# Patient Record
Sex: Female | Born: 1950
Health system: Southern US, Community
[De-identification: ages and names within clinical notes are randomized; demographics above are authoritative.]

## PROBLEM LIST (undated history)

## (undated) DIAGNOSIS — I10 Essential (primary) hypertension: Secondary | ICD-10-CM

## (undated) DIAGNOSIS — S2239XA Fracture of one rib, unspecified side, initial encounter for closed fracture: Secondary | ICD-10-CM

## (undated) DIAGNOSIS — T7840XA Allergy, unspecified, initial encounter: Secondary | ICD-10-CM

## (undated) DIAGNOSIS — S2220XA Unspecified fracture of sternum, initial encounter for closed fracture: Secondary | ICD-10-CM

## (undated) DIAGNOSIS — E119 Type 2 diabetes mellitus without complications: Secondary | ICD-10-CM

## (undated) DIAGNOSIS — F419 Anxiety disorder, unspecified: Secondary | ICD-10-CM

## (undated) DIAGNOSIS — E785 Hyperlipidemia, unspecified: Secondary | ICD-10-CM

## (undated) DIAGNOSIS — Z8719 Personal history of other diseases of the digestive system: Secondary | ICD-10-CM

## (undated) HISTORY — DX: Allergy, unspecified, initial encounter: T78.40XA

## (undated) HISTORY — PX: COLONOSCOPY: SHX174

## (undated) HISTORY — DX: Essential (primary) hypertension: I10

## (undated) HISTORY — PX: POLYPECTOMY: SHX149

---
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
## 1978-07-27 HISTORY — PX: OVARIAN CYST SURGERY: SHX726

## 1990-07-27 HISTORY — PX: TUBAL LIGATION: SHX77

## 1997-10-19 ENCOUNTER — Ambulatory Visit (HOSPITAL_COMMUNITY): Admission: RE | Admit: 1997-10-19 | Discharge: 1997-10-19 | Payer: Self-pay | Admitting: Family Medicine

## 1999-12-11 ENCOUNTER — Encounter: Payer: Self-pay | Admitting: Family Medicine

## 1999-12-11 ENCOUNTER — Ambulatory Visit (HOSPITAL_COMMUNITY): Admission: RE | Admit: 1999-12-11 | Discharge: 1999-12-11 | Payer: Self-pay | Admitting: Family Medicine

## 2000-04-27 ENCOUNTER — Encounter: Admission: RE | Admit: 2000-04-27 | Discharge: 2000-04-27 | Payer: Self-pay | Admitting: Otolaryngology

## 2000-04-27 ENCOUNTER — Encounter: Payer: Self-pay | Admitting: Otolaryngology

## 2001-04-29 ENCOUNTER — Encounter: Payer: Self-pay | Admitting: Family Medicine

## 2001-04-29 ENCOUNTER — Ambulatory Visit (HOSPITAL_COMMUNITY): Admission: RE | Admit: 2001-04-29 | Discharge: 2001-04-29 | Payer: Self-pay | Admitting: Family Medicine

## 2003-06-07 ENCOUNTER — Ambulatory Visit (HOSPITAL_COMMUNITY): Admission: RE | Admit: 2003-06-07 | Discharge: 2003-06-07 | Payer: Self-pay | Admitting: Family Medicine

## 2004-02-08 ENCOUNTER — Encounter: Admission: RE | Admit: 2004-02-08 | Discharge: 2004-02-08 | Payer: Self-pay | Admitting: Family Medicine

## 2004-09-12 ENCOUNTER — Ambulatory Visit: Payer: Self-pay | Admitting: Family Medicine

## 2005-03-19 ENCOUNTER — Other Ambulatory Visit: Admission: RE | Admit: 2005-03-19 | Discharge: 2005-03-19 | Payer: Self-pay | Admitting: Family Medicine

## 2005-03-19 ENCOUNTER — Ambulatory Visit: Payer: Self-pay | Admitting: Family Medicine

## 2005-03-27 ENCOUNTER — Ambulatory Visit: Payer: Self-pay | Admitting: Family Medicine

## 2005-09-21 ENCOUNTER — Ambulatory Visit: Payer: Self-pay | Admitting: Family Medicine

## 2005-11-27 ENCOUNTER — Ambulatory Visit: Payer: Self-pay | Admitting: Family Medicine

## 2006-02-09 ENCOUNTER — Ambulatory Visit: Payer: Self-pay | Admitting: Family Medicine

## 2006-03-03 ENCOUNTER — Ambulatory Visit: Payer: Self-pay | Admitting: Family Medicine

## 2006-04-01 ENCOUNTER — Ambulatory Visit: Payer: Self-pay | Admitting: Family Medicine

## 2006-04-20 ENCOUNTER — Ambulatory Visit: Payer: Self-pay | Admitting: Family Medicine

## 2006-05-25 ENCOUNTER — Ambulatory Visit: Payer: Self-pay | Admitting: Family Medicine

## 2006-05-25 ENCOUNTER — Encounter: Admission: RE | Admit: 2006-05-25 | Discharge: 2006-05-25 | Payer: Self-pay | Admitting: Family Medicine

## 2006-07-13 ENCOUNTER — Encounter: Payer: Self-pay | Admitting: Family Medicine

## 2006-07-13 ENCOUNTER — Ambulatory Visit: Payer: Self-pay | Admitting: Family Medicine

## 2006-07-13 ENCOUNTER — Other Ambulatory Visit: Admission: RE | Admit: 2006-07-13 | Discharge: 2006-07-13 | Payer: Self-pay | Admitting: Family Medicine

## 2006-07-13 LAB — CONVERTED CEMR LAB
AST: 36 units/L (ref 0–37)
Basophils Relative: 0.5 % (ref 0.0–1.0)
CO2: 27 meq/L (ref 19–32)
Calcium: 9.8 mg/dL (ref 8.4–10.5)
Chloride: 101 meq/L (ref 96–112)
Eosinophil percent: 2.6 % (ref 0.0–5.0)
Glomerular Filtration Rate, Af Am: 84 mL/min/{1.73_m2}
Glucose, Bld: 107 mg/dL — ABNORMAL HIGH (ref 70–99)
HDL: 70.8 mg/dL (ref >39.0)
MCHC: 33.3 g/dL (ref 30.0–36.0)
Monocytes Relative: 9.2 % (ref 3.0–11.0)
Neutro Abs: 1.9 10*3/uL (ref 1.4–7.7)
Platelets: 304 10*3/uL (ref 150–400)
RDW: 13.2 % (ref 11.5–14.6)
Sodium: 136 meq/L (ref 135–145)
Total Bilirubin: 1.5 mg/dL — ABNORMAL HIGH (ref 0.3–1.2)
Total Protein: 7.2 g/dL (ref 6.0–8.3)
Triglyceride fasting, serum: 54 mg/dL (ref 0–149)
VLDL: 11 mg/dL (ref 0–40)
WBC: 4.5 10*3/uL (ref 4.5–10.5)

## 2006-07-30 ENCOUNTER — Ambulatory Visit: Payer: Self-pay | Admitting: Family Medicine

## 2006-08-04 ENCOUNTER — Encounter: Admission: RE | Admit: 2006-08-04 | Discharge: 2006-08-04 | Payer: Self-pay | Admitting: Family Medicine

## 2006-08-18 ENCOUNTER — Ambulatory Visit: Payer: Self-pay | Admitting: Endocrinology

## 2006-11-29 ENCOUNTER — Ambulatory Visit: Payer: Self-pay | Admitting: Family Medicine

## 2006-12-31 ENCOUNTER — Ambulatory Visit: Payer: Self-pay | Admitting: Family Medicine

## 2006-12-31 DIAGNOSIS — I10 Essential (primary) hypertension: Secondary | ICD-10-CM

## 2006-12-31 DIAGNOSIS — N83209 Unspecified ovarian cyst, unspecified side: Secondary | ICD-10-CM

## 2006-12-31 DIAGNOSIS — R599 Enlarged lymph nodes, unspecified: Secondary | ICD-10-CM | POA: Insufficient documentation

## 2006-12-31 DIAGNOSIS — M26609 Unspecified temporomandibular joint disorder, unspecified side: Secondary | ICD-10-CM | POA: Insufficient documentation

## 2006-12-31 LAB — CONVERTED CEMR LAB
Blood in Urine, dipstick: NEGATIVE
Glucose, Urine, Semiquant: NEGATIVE
Ketones, urine, test strip: NEGATIVE
Nitrite: NEGATIVE
Protein, U semiquant: NEGATIVE
Urobilinogen, UA: NEGATIVE
pH: 6

## 2007-01-03 ENCOUNTER — Encounter (INDEPENDENT_AMBULATORY_CARE_PROVIDER_SITE_OTHER): Payer: Self-pay | Admitting: *Deleted

## 2007-01-03 LAB — CONVERTED CEMR LAB
Basophils Absolute: 0 10*3/uL (ref 0.0–0.1)
HCT: 39.7 % (ref 36.0–46.0)
Neutro Abs: 1.7 10*3/uL (ref 1.4–7.7)
RBC: 4.39 M/uL (ref 3.87–5.11)
RDW: 13.5 % (ref 11.5–14.6)
WBC: 4.3 10*3/uL — ABNORMAL LOW (ref 4.5–10.5)

## 2007-01-19 ENCOUNTER — Telehealth (INDEPENDENT_AMBULATORY_CARE_PROVIDER_SITE_OTHER): Payer: Self-pay | Admitting: *Deleted

## 2007-03-23 ENCOUNTER — Ambulatory Visit: Payer: Self-pay | Admitting: Family Medicine

## 2007-03-23 DIAGNOSIS — R21 Rash and other nonspecific skin eruption: Secondary | ICD-10-CM

## 2007-03-24 ENCOUNTER — Encounter: Payer: Self-pay | Admitting: Family Medicine

## 2007-03-29 ENCOUNTER — Encounter (INDEPENDENT_AMBULATORY_CARE_PROVIDER_SITE_OTHER): Payer: Self-pay | Admitting: *Deleted

## 2007-04-30 ENCOUNTER — Encounter: Payer: Self-pay | Admitting: *Deleted

## 2007-04-30 DIAGNOSIS — E042 Nontoxic multinodular goiter: Secondary | ICD-10-CM | POA: Insufficient documentation

## 2007-07-18 ENCOUNTER — Ambulatory Visit: Payer: Self-pay | Admitting: Internal Medicine

## 2007-09-07 ENCOUNTER — Ambulatory Visit: Payer: Self-pay | Admitting: Family Medicine

## 2007-09-07 LAB — CONVERTED CEMR LAB
Bilirubin Urine: NEGATIVE
Glucose, Urine, Semiquant: NEGATIVE
Ketones, urine, test strip: NEGATIVE
WBC Urine, dipstick: NEGATIVE
pH: 5.5

## 2007-09-29 ENCOUNTER — Ambulatory Visit: Payer: Self-pay | Admitting: Family Medicine

## 2007-09-29 LAB — CONVERTED CEMR LAB
Bilirubin Urine: NEGATIVE
Ketones, urine, test strip: NEGATIVE
Nitrite: NEGATIVE
Specific Gravity, Urine: <1.005
Urobilinogen, UA: NEGATIVE
pH: 8

## 2007-10-03 ENCOUNTER — Encounter (INDEPENDENT_AMBULATORY_CARE_PROVIDER_SITE_OTHER): Payer: Self-pay | Admitting: *Deleted

## 2007-10-17 ENCOUNTER — Ambulatory Visit (HOSPITAL_COMMUNITY): Admission: RE | Admit: 2007-10-17 | Discharge: 2007-10-17 | Payer: Self-pay | Admitting: Family Medicine

## 2007-10-27 ENCOUNTER — Encounter: Admission: RE | Admit: 2007-10-27 | Discharge: 2007-10-27 | Payer: Self-pay | Admitting: Family Medicine

## 2007-12-13 ENCOUNTER — Ambulatory Visit: Payer: Self-pay | Admitting: Family Medicine

## 2008-02-13 ENCOUNTER — Other Ambulatory Visit: Admission: RE | Admit: 2008-02-13 | Discharge: 2008-02-13 | Payer: Self-pay | Admitting: Family Medicine

## 2008-02-13 ENCOUNTER — Ambulatory Visit: Payer: Self-pay | Admitting: Family Medicine

## 2008-02-13 ENCOUNTER — Encounter: Payer: Self-pay | Admitting: Family Medicine

## 2008-02-17 ENCOUNTER — Encounter (INDEPENDENT_AMBULATORY_CARE_PROVIDER_SITE_OTHER): Payer: Self-pay | Admitting: *Deleted

## 2008-02-23 ENCOUNTER — Encounter (INDEPENDENT_AMBULATORY_CARE_PROVIDER_SITE_OTHER): Payer: Self-pay | Admitting: *Deleted

## 2008-03-09 ENCOUNTER — Encounter (INDEPENDENT_AMBULATORY_CARE_PROVIDER_SITE_OTHER): Payer: Self-pay | Admitting: *Deleted

## 2008-04-07 ENCOUNTER — Ambulatory Visit: Payer: Self-pay | Admitting: Family Medicine

## 2008-04-07 DIAGNOSIS — J111 Influenza due to unidentified influenza virus with other respiratory manifestations: Secondary | ICD-10-CM

## 2008-04-07 LAB — CONVERTED CEMR LAB: Inflenza A Ag: NEGATIVE

## 2008-04-23 ENCOUNTER — Ambulatory Visit: Payer: Self-pay | Admitting: Family Medicine

## 2008-05-04 ENCOUNTER — Encounter (INDEPENDENT_AMBULATORY_CARE_PROVIDER_SITE_OTHER): Payer: Self-pay | Admitting: *Deleted

## 2008-05-04 ENCOUNTER — Telehealth (INDEPENDENT_AMBULATORY_CARE_PROVIDER_SITE_OTHER): Payer: Self-pay | Admitting: *Deleted

## 2008-05-04 ENCOUNTER — Ambulatory Visit: Payer: Self-pay | Admitting: Internal Medicine

## 2008-06-06 ENCOUNTER — Encounter: Admission: RE | Admit: 2008-06-06 | Discharge: 2008-06-06 | Payer: Self-pay | Admitting: Family Medicine

## 2008-06-07 ENCOUNTER — Encounter (INDEPENDENT_AMBULATORY_CARE_PROVIDER_SITE_OTHER): Payer: Self-pay | Admitting: *Deleted

## 2008-08-22 ENCOUNTER — Telehealth: Payer: Self-pay | Admitting: Family Medicine

## 2008-08-24 ENCOUNTER — Ambulatory Visit: Payer: Self-pay | Admitting: Family Medicine

## 2008-08-24 DIAGNOSIS — K3189 Other diseases of stomach and duodenum: Secondary | ICD-10-CM

## 2008-08-24 DIAGNOSIS — R002 Palpitations: Secondary | ICD-10-CM

## 2008-08-24 DIAGNOSIS — R1013 Epigastric pain: Secondary | ICD-10-CM

## 2008-08-25 ENCOUNTER — Emergency Department (HOSPITAL_BASED_OUTPATIENT_CLINIC_OR_DEPARTMENT_OTHER): Admission: EM | Admit: 2008-08-25 | Discharge: 2008-08-25 | Payer: Self-pay | Admitting: Emergency Medicine

## 2008-08-27 ENCOUNTER — Telehealth (INDEPENDENT_AMBULATORY_CARE_PROVIDER_SITE_OTHER): Payer: Self-pay | Admitting: *Deleted

## 2008-08-27 DIAGNOSIS — E8881 Metabolic syndrome: Secondary | ICD-10-CM | POA: Insufficient documentation

## 2008-08-28 ENCOUNTER — Telehealth: Payer: Self-pay | Admitting: Family Medicine

## 2008-08-29 ENCOUNTER — Ambulatory Visit: Payer: Self-pay | Admitting: Family Medicine

## 2008-08-29 LAB — CONVERTED CEMR LAB
Bilirubin Urine: NEGATIVE
Blood in Urine, dipstick: NEGATIVE
Glucose, Bld: 378 mg/dL
Nitrite: NEGATIVE
Urobilinogen, UA: 0.2
WBC Urine, dipstick: NEGATIVE

## 2008-09-05 ENCOUNTER — Ambulatory Visit: Payer: Self-pay

## 2008-09-05 ENCOUNTER — Ambulatory Visit: Payer: Self-pay | Admitting: Family Medicine

## 2008-09-19 ENCOUNTER — Encounter: Payer: Self-pay | Admitting: Family Medicine

## 2008-09-25 ENCOUNTER — Encounter: Payer: Self-pay | Admitting: Family Medicine

## 2008-10-03 ENCOUNTER — Telehealth (INDEPENDENT_AMBULATORY_CARE_PROVIDER_SITE_OTHER): Payer: Self-pay | Admitting: *Deleted

## 2008-10-05 ENCOUNTER — Telehealth (INDEPENDENT_AMBULATORY_CARE_PROVIDER_SITE_OTHER): Payer: Self-pay | Admitting: *Deleted

## 2008-10-25 ENCOUNTER — Telehealth (INDEPENDENT_AMBULATORY_CARE_PROVIDER_SITE_OTHER): Payer: Self-pay | Admitting: *Deleted

## 2008-11-08 ENCOUNTER — Ambulatory Visit: Payer: Self-pay | Admitting: Family Medicine

## 2008-11-16 ENCOUNTER — Encounter (INDEPENDENT_AMBULATORY_CARE_PROVIDER_SITE_OTHER): Payer: Self-pay | Admitting: *Deleted

## 2008-11-16 LAB — CONVERTED CEMR LAB
AST: 26 units/L (ref 0–37)
Albumin: 4.1 g/dL (ref 3.5–5.2)
Alkaline Phosphatase: 64 units/L (ref 39–117)
Calcium: 9.7 mg/dL (ref 8.4–10.5)
Cholesterol: 269 mg/dL — ABNORMAL HIGH (ref 0–200)
Creatinine, Ser: 0.9 mg/dL (ref 0.4–1.2)
Creatinine,U: 145.7 mg/dL
Direct LDL: 186.4 mg/dL
Glucose, Bld: 80 mg/dL (ref 70–99)
Potassium: 5.2 meq/L — ABNORMAL HIGH (ref 3.5–5.1)
Total Bilirubin: 1.3 mg/dL — ABNORMAL HIGH (ref 0.3–1.2)
VLDL: 8 mg/dL (ref 0.0–40.0)

## 2008-12-19 ENCOUNTER — Ambulatory Visit: Payer: Self-pay | Admitting: Family Medicine

## 2008-12-19 DIAGNOSIS — B029 Zoster without complications: Secondary | ICD-10-CM | POA: Insufficient documentation

## 2009-03-21 ENCOUNTER — Ambulatory Visit: Payer: Self-pay | Admitting: Family Medicine

## 2009-03-21 DIAGNOSIS — J019 Acute sinusitis, unspecified: Secondary | ICD-10-CM

## 2009-04-01 LAB — CONVERTED CEMR LAB
ALT: 14 units/L (ref 0–35)
Albumin: 4.2 g/dL (ref 3.5–5.2)
Calcium: 9 mg/dL (ref 8.4–10.5)
Chloride: 102 meq/L (ref 96–112)
Creatinine, Ser: 0.8 mg/dL (ref 0.4–1.2)
Creatinine,U: 57.6 mg/dL
Direct LDL: 167.3 mg/dL
GFR calc non Af Amer: 95.18 mL/min (ref >60)
Hgb A1c MFr Bld: 5.7 % (ref 4.6–6.5)
Microalb, Ur: 0.2 mg/dL (ref 0.0–1.9)
VLDL: 8.4 mg/dL (ref 0.0–40.0)

## 2009-04-04 ENCOUNTER — Telehealth: Payer: Self-pay | Admitting: Family Medicine

## 2009-05-22 ENCOUNTER — Encounter: Admission: RE | Admit: 2009-05-22 | Discharge: 2009-05-22 | Payer: Self-pay | Admitting: Family Medicine

## 2009-05-27 ENCOUNTER — Encounter (INDEPENDENT_AMBULATORY_CARE_PROVIDER_SITE_OTHER): Payer: Self-pay | Admitting: *Deleted

## 2009-09-16 ENCOUNTER — Telehealth (INDEPENDENT_AMBULATORY_CARE_PROVIDER_SITE_OTHER): Payer: Self-pay | Admitting: *Deleted

## 2009-10-21 ENCOUNTER — Telehealth (INDEPENDENT_AMBULATORY_CARE_PROVIDER_SITE_OTHER): Payer: Self-pay | Admitting: *Deleted

## 2009-10-24 ENCOUNTER — Telehealth (INDEPENDENT_AMBULATORY_CARE_PROVIDER_SITE_OTHER): Payer: Self-pay | Admitting: *Deleted

## 2009-10-28 ENCOUNTER — Ambulatory Visit: Payer: Self-pay | Admitting: Family Medicine

## 2009-10-29 LAB — CONVERTED CEMR LAB
ALT: 18 units/L (ref 0–35)
AST: 19 units/L (ref 0–37)
Alkaline Phosphatase: 54 units/L (ref 39–117)
Bilirubin, Direct: 0 mg/dL (ref 0.0–0.3)
CO2: 31 meq/L (ref 19–32)
Calcium: 9.3 mg/dL (ref 8.4–10.5)
Chloride: 103 meq/L (ref 96–112)
GFR calc non Af Amer: 94.97 mL/min (ref >60)
Glucose, Bld: 79 mg/dL (ref 70–99)
HDL: 80 mg/dL (ref >39.00)
Sodium: 140 meq/L (ref 135–145)
Total Bilirubin: 0.5 mg/dL (ref 0.3–1.2)
Total CHOL/HDL Ratio: 3

## 2009-11-19 ENCOUNTER — Telehealth: Payer: Self-pay | Admitting: Gastroenterology

## 2009-11-27 ENCOUNTER — Telehealth (INDEPENDENT_AMBULATORY_CARE_PROVIDER_SITE_OTHER): Payer: Self-pay | Admitting: *Deleted

## 2009-12-12 ENCOUNTER — Telehealth (INDEPENDENT_AMBULATORY_CARE_PROVIDER_SITE_OTHER): Payer: Self-pay | Admitting: *Deleted

## 2009-12-24 ENCOUNTER — Ambulatory Visit: Payer: Self-pay | Admitting: Family Medicine

## 2010-02-20 ENCOUNTER — Ambulatory Visit: Payer: Self-pay | Admitting: Family Medicine

## 2010-02-20 ENCOUNTER — Other Ambulatory Visit: Admission: RE | Admit: 2010-02-20 | Discharge: 2010-02-20 | Payer: Self-pay | Admitting: Family Medicine

## 2010-02-20 LAB — HM DIABETES FOOT EXAM

## 2010-02-20 LAB — HM COLONOSCOPY

## 2010-02-26 ENCOUNTER — Telehealth: Payer: Self-pay | Admitting: Family Medicine

## 2010-03-04 ENCOUNTER — Ambulatory Visit: Payer: Self-pay | Admitting: Family Medicine

## 2010-03-04 ENCOUNTER — Encounter (INDEPENDENT_AMBULATORY_CARE_PROVIDER_SITE_OTHER): Payer: Self-pay | Admitting: *Deleted

## 2010-03-05 LAB — CONVERTED CEMR LAB: Fecal Occult Bld: NEGATIVE

## 2010-04-03 ENCOUNTER — Telehealth (INDEPENDENT_AMBULATORY_CARE_PROVIDER_SITE_OTHER): Payer: Self-pay | Admitting: *Deleted

## 2010-04-08 ENCOUNTER — Ambulatory Visit: Payer: Self-pay | Admitting: Family Medicine

## 2010-04-08 DIAGNOSIS — B353 Tinea pedis: Secondary | ICD-10-CM

## 2010-06-27 ENCOUNTER — Telehealth: Payer: Self-pay | Admitting: Family Medicine

## 2010-08-12 ENCOUNTER — Ambulatory Visit
Admission: RE | Admit: 2010-08-12 | Discharge: 2010-08-12 | Payer: Self-pay | Source: Home / Self Care | Attending: Family Medicine | Admitting: Family Medicine

## 2010-08-12 DIAGNOSIS — M25519 Pain in unspecified shoulder: Secondary | ICD-10-CM | POA: Insufficient documentation

## 2010-08-12 DIAGNOSIS — J069 Acute upper respiratory infection, unspecified: Secondary | ICD-10-CM | POA: Insufficient documentation

## 2010-08-21 ENCOUNTER — Telehealth: Payer: Self-pay | Admitting: Family Medicine

## 2010-08-21 ENCOUNTER — Encounter: Payer: Self-pay | Admitting: Family Medicine

## 2010-08-24 LAB — CONVERTED CEMR LAB
ALT: 15 units/L (ref 0–35)
Albumin: 4.2 g/dL (ref 3.5–5.2)
Alkaline Phosphatase: 52 units/L (ref 39–117)
Alkaline Phosphatase: 81 units/L (ref 39–117)
BUN: 17 mg/dL (ref 6–23)
BUN: 18 mg/dL (ref 6–23)
Basophils Absolute: 0 10*3/uL (ref 0.0–0.1)
Basophils Absolute: 0 10*3/uL (ref 0.0–0.1)
Basophils Absolute: 0 10*3/uL (ref 0.0–0.1)
Basophils Relative: 0.8 % (ref 0.0–3.0)
Bilirubin, Direct: 0.2 mg/dL (ref 0.0–0.3)
Calcium: 10.1 mg/dL (ref 8.4–10.5)
Calcium: 9.1 mg/dL (ref 8.4–10.5)
Chloride: 101 meq/L (ref 96–112)
Chloride: 87 meq/L — ABNORMAL LOW (ref 96–112)
Cholesterol: 244 mg/dL (ref 0–200)
Cholesterol: 255 mg/dL — ABNORMAL HIGH (ref 0–200)
Eosinophils Absolute: 0.1 10*3/uL (ref 0.0–0.7)
Eosinophils Absolute: 0.1 10*3/uL (ref 0.0–0.7)
Eosinophils Relative: 2.2 % (ref 0.0–5.0)
Eosinophils Relative: 3.6 % (ref 0.0–5.0)
Free T4: 0.94 ng/dL (ref 0.60–1.60)
GFR calc non Af Amer: 87.27 mL/min (ref >60)
Glucose, Bld: 210 mg/dL — ABNORMAL HIGH (ref 70–99)
Glucose, Bld: 79 mg/dL (ref 70–99)
Glucose, Bld: 792 mg/dL (ref 70–99)
HCT: 39 % (ref 36.0–46.0)
HCT: 45.5 % (ref 36.0–46.0)
HDL: 66.3 mg/dL (ref >39.0)
HDL: 77.2 mg/dL (ref >39.00)
Hgb A1c MFr Bld: 5.8 % (ref 4.6–6.5)
Lymphocytes Relative: 38.4 % (ref 12.0–46.0)
Lymphs Abs: 1.6 10*3/uL (ref 0.7–4.0)
Lymphs Abs: 2 10*3/uL (ref 0.7–4.0)
MCHC: 33.7 g/dL (ref 30.0–36.0)
MCHC: 34.5 g/dL (ref 30.0–36.0)
MCV: 86.3 fL (ref 78.0–100.0)
Monocytes Absolute: 0.3 10*3/uL (ref 0.1–1.0)
Monocytes Relative: 8.4 % (ref 3.0–12.0)
Neutro Abs: 2 10*3/uL (ref 1.4–7.7)
Neutro Abs: 2.7 10*3/uL (ref 1.4–7.7)
Neutrophils Relative %: 48 % (ref 43.0–77.0)
Neutrophils Relative %: 60 % (ref 43–77)
Platelets: 288 10*3/uL (ref 150.0–400.0)
Platelets: 333 10*3/uL (ref 150–400)
Potassium: 4.3 meq/L (ref 3.5–5.1)
RBC: 4.57 M/uL (ref 3.87–5.11)
RBC: 5.27 M/uL — ABNORMAL HIGH (ref 3.87–5.11)
RDW: 13.1 % (ref 11.5–14.6)
RDW: 13.1 % (ref 11.5–15.5)
Sodium: 129 meq/L — ABNORMAL LOW (ref 135–145)
Sodium: 140 meq/L (ref 135–145)
T3, Free: 2.5 pg/mL (ref 2.3–4.2)
TSH: 0.439 microintl units/mL (ref 0.350–4.50)
Total CHOL/HDL Ratio: 3.7
Total Protein: 7 g/dL (ref 6.0–8.3)
WBC: 5.2 10*3/uL (ref 4.5–10.5)
WBC: 6.7 10*3/uL (ref 4.0–10.5)

## 2010-08-26 NOTE — Progress Notes (Signed)
Summary: foot itching - pt diabetic  Phone Note Call from Patient Call back at Home Phone [phone]   Caller: Patient Summary of Call: patient is diabetic - bottom of foot itching &  getting crusty  - refused appt - wanted to talk to nurse .Marland KitchenOkey Regal [place]  April 03, 2010 9:43 AM  left message to call office...................Marland KitchenFelecia Deloach CMA  April 03, 2010 10:00 AM   Follow-up for Phone Call        pt states that her right foot near baby toe has become very itchy and crusty. pt advise she would need ov to evaluate further pt ok, OV schedule...........Marland KitchenFelecia Deloach CMA  April 03, 2010 1:22 PM

## 2010-08-26 NOTE — Assessment & Plan Note (Signed)
Summary: bottom of foot itching/cbs   Vital Signs:  Patient profile:   60 year old female Menstrual status:  postmenopausal Weight:      187 pounds Temp:     98.5 degrees F oral Pulse rate:   72 / minute Pulse rhythm:   regular BP sitting:   120 / 76  (right arm)  Vitals Entered By: Almeta Monas CMA Duncan Dull) (April 08, 2010 10:44 AM) CC: c/o itching to the bottom of the feet Is Patient Diabetic? Yes Did you bring your meter with you today? No   History of Present Illness: pt here c/o itchy skin on bottom of R foot.  Pt using gold bond with no relief.   Current Medications (verified): 1)  Multivitamin .... Once Daily 2)  Calcium .... Once Daily 3)  Vit C .... Once Daily 4)  Gingko Biloba 5)  Diovan Hct 80-12.5 Mg Tabs (Valsartan-Hydrochlorothiazide) .Marland Kitchen.. 1 By Mouth Once Daily 6)  Janumet 50-1000 Mg Tabs (Sitagliptin-Metformin Hcl) .Marland Kitchen.. 1 By Mouth Two Times A Day. 7)  Freestyle Lite Test  Strp (Glucose Blood) .... Pt Test 2-3 Times A Day 8)  Freestyle Lancets  Misc (Lancets) .... Pt Test 2-3 Times A Day 9)  Naftin 1 % Crea (Naftifine Hcl) .... Apply Daily  Allergies (verified): 1)  ! Sulfa  Past History:  Past medical, surgical, family and social histories (including risk factors) reviewed for relevance to current acute and chronic problems.  Past Medical History: Reviewed history from 08/27/2008 and no changes required. Hypertension Diabetes mellitus, type II  Past Surgical History: Reviewed history from 02/13/2008 and no changes required. Lumpectomy, L ovarian cyst  Family History: Reviewed history from 02/20/2010 and no changes required. Family History of CAD Female 1st degree relative <50 Family History of Prostate CA 1st degree relative <50 Family History Diabetes 1st degree relative  Social History: Reviewed history from 02/20/2010 and no changes required. Married Never Smoked Alcohol use-yes Occupation:student-- community and justice  studies Regular exercise-no Drug use-no  Review of Systems      See HPI  Physical Exam  General:  Well-developed,well-nourished,in no acute distress; alert,appropriate and cooperative throughout examination Skin:  r foot--bottom,  scaley red rash itchy Psych:  Oriented X3 and normally interactive.     Impression & Recommendations:  Problem # 1:  TINEA PEDIS (ICD-110.4)  Her updated medication list for this problem includes:    Naftin 1 % Crea (Naftifine hcl) .Marland Kitchen... Apply daily  Take medication as directed for full duration.   Complete Medication List: 1)  Multivitamin  .... Once daily 2)  Calcium  .... Once daily 3)  Vit C  .... Once daily 4)  Gingko Biloba  5)  Diovan Hct 80-12.5 Mg Tabs (Valsartan-hydrochlorothiazide) .Marland Kitchen.. 1 by mouth once daily 6)  Janumet 50-1000 Mg Tabs (Sitagliptin-metformin hcl) .Marland Kitchen.. 1 by mouth two times a day. 7)  Freestyle Lite Test Strp (Glucose blood) .... Pt test 2-3 times a day 8)  Freestyle Lancets Misc (Lancets) .... Pt test 2-3 times a day 9)  Naftin 1 % Crea (Naftifine hcl) .... Apply daily  Patient Instructions: 1)  Please schedule a follow-up appointment as needed .  Prescriptions: NAFTIN 1 % CREA (NAFTIFINE HCL) apply daily  #30g x 2   Entered and Authorized by:   Loreen Freud DO   Signed by:   Loreen Freud DO on 04/08/2010   Method used:   Print then Give to Patient   RxID:   [id number]

## 2010-08-26 NOTE — Progress Notes (Signed)
Summary: Refill Request  Phone Note Refill Request Message from:  Pharmacy on [address] Fax #: [phone]  Refills Requested: Medication #1:  JANUMET 50-1000 MG TABS 1 by mouth two times a day   Dosage confirmed as above?Dosage Confirmed Initial call taken by: Harold Barban,  September 16, 2009 9:16 AM    New/Updated Medications: JANUMET 50-1000 MG TABS (SITAGLIPTIN-METFORMIN HCL) 1 by mouth two times a day. NEEDS LABWORK. Prescriptions: JANUMET 50-1000 MG TABS (SITAGLIPTIN-METFORMIN HCL) 1 by mouth two times a day. NEEDS LABWORK.  #60 x 0   Entered by:   Army Fossa CMA   Authorized by:   Loreen Freud DO   Signed by:   Army Fossa CMA on 09/16/2009   Method used:   Electronically to        [facility] (retail)       [address]       [place], [place]  [address]       Ph: [phone]       Fax: [phone]   RxID:   [id number]

## 2010-08-26 NOTE — Assessment & Plan Note (Signed)
Summary: bp check/cbs   Vital Signs:  Patient profile:   60 year old female Menstrual status:  postmenopausal Height:      65 inches Weight:      186 pounds BMI:     31.06 Pulse rate:   66 / minute Pulse rhythm:   regular BP sitting:   118 / 82  (left arm) Cuff size:   regular  Vitals Entered By: Army Fossa CMA (Dec 24, 2009 2:38 PM) CC: pT HERE FOR FOLLOW UP ON BP   History of Present Illness:  Hyperlipidemia follow-up      This is a 60 year old woman who presents for Hyperlipidemia follow-up.  The patient denies muscle aches, GI upset, abdominal pain, flushing, itching, constipation, diarrhea, and fatigue.  The patient denies the following symptoms: chest pain/pressure, exercise intolerance, dypsnea, palpitations, syncope, and pedal edema.  Compliance with medications (by patient report) has been poor.  Dietary compliance has been good.  The patient reports exercising 3-4X per week.  Adjunctive measures currently used by the patient include weight reduction.    Hypertension follow-up      The patient also presents for Hypertension follow-up.  The patient denies lightheadedness, urinary frequency, headaches, edema, impotence, rash, and fatigue.  The patient denies the following associated symptoms: chest pain, chest pressure, exercise intolerance, dyspnea, palpitations, syncope, leg edema, and pedal edema.  Compliance with medications (by patient report) has been near 100%.  The patient reports that dietary compliance has been good.  Adjunctive measures currently used by the patient include salt restriction.    Type 1 diabetes mellitus follow-up      The patient is also here for Type 2 diabetes mellitus follow-up.  The patient denies polyuria, polydipsia, blurred vision, self managed hypoglycemia, hypoglycemia requiring help, weight loss, weight gain, and numbness of extremities.  The patient denies the following symptoms: neuropathic pain, chest pain, vomiting, orthostatic  symptoms, poor wound healing, intermittent claudication, vision loss, and foot ulcer.  Since the last visit the patient reports good dietary compliance, compliance with medications, and monitoring blood glucose.  The patient has been measuring capillary blood glucose before breakfast.  Since the last visit, the patient reports having had eye care by an ophthalmologist and no foot care.    Current Medications (verified): 1)  Multivitamin .... Once Daily 2)  Calcium .... Once Daily 3)  Vit C .... Once Daily 4)  Gingko Biloba 5)  Diovan Hct 80-12.5 Mg Tabs (Valsartan-Hydrochlorothiazide) .Marland Kitchen.. 1 By Mouth Once Daily 6)  Janumet 50-1000 Mg Tabs (Sitagliptin-Metformin Hcl) .Marland Kitchen.. 1 By Mouth Two Times A Day. 7)  Freestyle Lite Test  Strp (Glucose Blood) .... Pt Test 2-3 Times A Day 8)  Freestyle Lancets  Misc (Lancets) .... Pt Test 2-3 Times A Day  Allergies: 1)  ! Sulfa  Past History:  Past medical, surgical, family and social histories (including risk factors) reviewed for relevance to current acute and chronic problems.  Past Medical History: Reviewed history from 08/27/2008 and no changes required. Hypertension Diabetes mellitus, type II  Past Surgical History: Reviewed history from 02/13/2008 and no changes required. Lumpectomy, L ovarian cyst  Family History: Reviewed history from 12/31/2006 and no changes required. Family History of CAD Female 1st degree relative <50 Family History of Prostate CA 1st degree relative <50  Social History: Reviewed history from 02/13/2008 and no changes required. Married Never Smoked Alcohol use-yes Occupation:student Regular exercise-no Drug use-no  Review of Systems      See HPI  Physical  Exam  General:  Well-developed,well-nourished,in no acute distress; alert,appropriate and cooperative throughout examination Lungs:  Normal respiratory effort, chest expands symmetrically. Lungs are clear to auscultation, no crackles or wheezes. Heart:   normal rate and no murmur.   Extremities:  No clubbing, cyanosis, edema, or deformity noted with normal full range of motion of all joints.   Psych:  Oriented X3 and normally interactive.    Diabetes Management Exam:    Foot Exam (with socks and/or shoes not present):       Sensory-Pinprick/Light touch:          Left medial foot (L-4): normal          Left dorsal foot (L-5): normal          Left lateral foot (S-1): normal          Right medial foot (L-4): normal          Right dorsal foot (L-5): normal          Right lateral foot (S-1): normal       Sensory-Monofilament:          Left foot: normal          Right foot: normal       Inspection:          Left foot: normal          Right foot: normal       Nails:          Left foot: normal          Right foot: normal    Eye Exam:       Eye Exam done elsewhere   Impression & Recommendations:  Problem # 1:  DIABETES MELLITUS, TYPE II (ICD-250.00)  The following medications were removed from the medication list:    Actos 30 Mg Tabs (Pioglitazone hcl) .Marland Kitchen... Take one tablet daily. Her updated medication list for this problem includes:    Diovan Hct 80-12.5 Mg Tabs (Valsartan-hydrochlorothiazide) .Marland Kitchen... 1 by mouth once daily    Janumet 50-1000 Mg Tabs (Sitagliptin-metformin hcl) .Marland Kitchen... 1 by mouth two times a day.  Labs Reviewed: Creat: 0.8 (10/28/2009)    Reviewed HgBA1c results: 5.8 (10/28/2009)  5.7 (03/21/2009)  Problem # 2:  HYPERTENSION (ICD-401.9)  Her updated medication list for this problem includes:    Diovan Hct 80-12.5 Mg Tabs (Valsartan-hydrochlorothiazide) .Marland Kitchen... 1 by mouth once daily  BP today: 118/82 Prior BP: 120/80 (03/21/2009)  Prior 10 Yr Risk Heart Disease: Not enough information (05/04/2008)  Labs Reviewed: K+: 4.2 (10/28/2009) Creat: : 0.8 (10/28/2009)   Chol: 224 (10/28/2009)   HDL: 80.00 (10/28/2009)   LDL: DEL (02/13/2008)   TG: 81.0 (10/28/2009)  Problem # 3:  HYPERLIPIDEMIA (ICD-272.4)  The  following medications were removed from the medication list:    Pravachol 40 Mg Tabs (Pravastatin sodium) .Marland Kitchen... Take one tablet every evening at bedtime.    Crestor 20 Mg Tabs (Rosuvastatin calcium) .Marland Kitchen... 1 by mouth once daily.  Labs Reviewed: SGOT: 19 (10/28/2009)   SGPT: 18 (10/28/2009)  Prior 10 Yr Risk Heart Disease: Not enough information (05/04/2008)   HDL:80.00 (10/28/2009), 75.00 (03/21/2009)  LDL:DEL (02/13/2008), DEL (07/13/2006)  Chol:224 (10/28/2009), 244 (03/21/2009)  Trig:81.0 (10/28/2009), 42.0 (03/21/2009)  Complete Medication List: 1)  Multivitamin  .... Once daily 2)  Calcium  .... Once daily 3)  Vit C  .... Once daily 4)  Gingko Biloba  5)  Diovan Hct 80-12.5 Mg Tabs (Valsartan-hydrochlorothiazide) .Marland Kitchen.. 1 by mouth once daily 6)  Janumet 50-1000 Mg Tabs (Sitagliptin-metformin hcl) .Marland Kitchen.. 1 by mouth two times a day. 7)  Freestyle Lite Test Strp (Glucose blood) .... Pt test 2-3 times a day 8)  Freestyle Lancets Misc (Lancets) .... Pt test 2-3 times a day  Patient Instructions: 1)  fasting labs july---272.4,  401.9 , 250.00  hep, lipid, bmp,  hgba1c, microalbumin  2)  Please schedule a follow-up appointment in 6 months .  Prescriptions: JANUMET 50-1000 MG TABS (SITAGLIPTIN-METFORMIN HCL) 1 by mouth two times a day.  #60 x 5   Entered and Authorized by:   Loreen Freud DO   Signed by:   Loreen Freud DO on 12/24/2009   Method used:   Electronically to        [facility] [address] (retail)       [address]       [place], [place]  [address]       Ph: [phone]       Fax: [phone]   RxID:   [id number] DIOVAN HCT 80-12.5 MG TABS (VALSARTAN-HYDROCHLOROTHIAZIDE) 1 by mouth once daily  #30 x 5   Entered and Authorized by:   Loreen Freud DO   Signed by:   Loreen Freud DO on 12/24/2009   Method used:   Electronically to        [facility] (retail)       [address]        [place], [place]  [address]       Ph: [phone]       Fax: [phone]   RxID:   [id number]

## 2010-08-26 NOTE — Progress Notes (Signed)
Summary: Refill Request  Phone Note Refill Request Message from:  Pharmacy on [address] Fax #: [phone]  Refills Requested: Medication #1:  JANUMET 50-1000 MG TABS 1 by mouth two times a day. NEEDS LABWORK.   Dosage confirmed as above?Dosage Confirmed   Supply Requested: 1 month   Last Refilled: 08/15/2009 Next Appointment Scheduled: 4.4.11 Initial call taken by: Harold Barban,  October 24, 2009 10:35 AM    Prescriptions: JANUMET 50-1000 MG TABS (SITAGLIPTIN-METFORMIN HCL) 1 by mouth two times a day. NEEDS LABWORK.  #60 x 0   Entered by:   Army Fossa CMA   Authorized by:   Loreen Freud DO   Signed by:   Army Fossa CMA on 10/24/2009   Method used:   Electronically to        [facility] [address] (retail)       [address]       [place], [place]  [address]       Ph: [phone]       Fax: [phone]   RxID:   [id number]

## 2010-08-26 NOTE — Progress Notes (Signed)
Summary: Refill Request  Phone Note Refill Request Call back at [phone] Message from:  Pharmacy on Nov 27, 2009 8:51 AM  Refills Requested: Medication #1:  JANUMET 50-1000 MG TABS 1 by mouth two times a day. NEEDS LABWORK.   Dosage confirmed as above?Dosage Confirmed   Supply Requested: 1 month   Last Refilled: 09/16/2009 [facility] [address]   Next Appointment Scheduled: none Initial call taken by: Harold Barban,  Nov 27, 2009 8:51 AM    New/Updated Medications: JANUMET 50-1000 MG TABS (SITAGLIPTIN-METFORMIN HCL) 1 by mouth two times a day. Prescriptions: JANUMET 50-1000 MG TABS (SITAGLIPTIN-METFORMIN HCL) 1 by mouth two times a day.  #30 x 2   Entered by:   Army Fossa CMA   Authorized by:   Loreen Freud DO   Signed by:   Army Fossa CMA on 11/27/2009   Method used:   Electronically to        [facility] [address] (retail)       [address]       [place], [place]  [address]       Ph: [phone]       Fax: [phone]   RxID:   [id number]

## 2010-08-26 NOTE — Progress Notes (Signed)
Summary: Janumet refill  Phone Note Refill Request Message from:  Patient on February 26, 2010 9:54 AM  Refills Requested: Medication #1:  JANUMET 50-1000 MG TABS 1 by mouth two times a day. Spoke with patient about labs and sent in Lipitor prescription.  Initial call taken by: Lucious Groves CMA,  February 26, 2010 9:55 AM    New/Updated Medications: LIPITOR 20 MG TABS (ATORVASTATIN CALCIUM) 1 by mouth at bedtime Prescriptions: JANUMET 50-1000 MG TABS (SITAGLIPTIN-METFORMIN HCL) 1 by mouth two times a day.  #60 x 5   Entered by:   Lucious Groves CMA   Authorized by:   Loreen Freud DO   Signed by:   Lucious Groves CMA on 02/26/2010   Method used:   Electronically to        [facility] (retail)       [address]       [place], [place]  [address]       Ph: [phone]       Fax: [phone]   RxID:   [id number] LIPITOR 20 MG TABS (ATORVASTATIN CALCIUM) 1 by mouth at bedtime  #30 x 2   Entered by:   Lucious Groves CMA   Authorized by:   Loreen Freud DO   Signed by:   Lucious Groves CMA on 02/26/2010   Method used:   Electronically to        [facility] [address] (retail)       [address]       [place], [place]  [address]       Ph: [phone]       Fax: [phone]   RxID:   [id number]

## 2010-08-26 NOTE — Assessment & Plan Note (Signed)
Summary: cpx & lab/cbs   Vital Signs:  Patient profile:   60 year old female Menstrual status:  postmenopausal Height:      65 inches Weight:      186 pounds Temp:     98.4 degrees F oral Pulse rate:   72 / minute Resp:     18 per minute BP sitting:   120 / 78  (left arm)  Vitals Entered By: Jeremy Johann CMA (February 20, 2010 8:40 AM) CC: CPX,FASTING PAP   History of Present Illness: Pt here for cpe,pap and labs.    Hyperlipidemia follow-up      This is a 60 year old woman who presents for Hyperlipidemia follow-up.  The patient denies muscle aches, GI upset, abdominal pain, flushing, itching, constipation, diarrhea, and fatigue.  The patient denies the following symptoms: chest pain/pressure, exercise intolerance, dypsnea, palpitations, syncope, and pedal edema.  Compliance with medications (by patient report) has been poor.  Pt is afraid of the side effects.   Dietary compliance has been good.  The patient reports exercising occasionally.  Adjunctive measures currently used by the patient include ASA.    Hypertension follow-up      The patient also presents for Hypertension follow-up.  The patient denies lightheadedness, urinary frequency, headaches, edema, impotence, rash, and fatigue.  The patient denies the following associated symptoms: chest pain, chest pressure, exercise intolerance, dyspnea, palpitations, syncope, leg edema, and pedal edema.  Compliance with medications (by patient report) has been near 100%.  The patient reports that dietary compliance has been good.  The patient reports exercising occasionally.  Adjunctive measures currently used by the patient include salt restriction.    Type 1 diabetes mellitus follow-up      The patient is also here for Type 2 diabetes mellitus follow-up.  The patient denies polyuria, polydipsia, blurred vision, self managed hypoglycemia, hypoglycemia requiring help, weight loss, weight gain, and numbness of extremities.  The patient denies  the following symptoms: neuropathic pain, chest pain, vomiting, orthostatic symptoms, poor wound healing, intermittent claudication, vision loss, and foot ulcer.  Since the last visit the patient reports compliance with medications and monitoring blood glucose.  The patient has been measuring capillary blood glucose before breakfast.  Since the last visit, the patient reports having had eye care by an ophthalmologist and no foot care.    Preventive Screening-Counseling & Management  Alcohol-Tobacco     Alcohol drinks/day: <1     Alcohol type: wine     Smoking Status: never  Caffeine-Diet-Exercise     Caffeine use/day: 0     Diet Comments: ok--pt knows she needs to work on it     Does Patient Exercise: yes     Type of exercise: recumbant bike     Exercise (avg: min/session): 30-60     Times/week: <3     Exercise Counseling: to improve exercise regimen  Hep-HIV-STD-Contraception     HIV Risk: no     Dental Visit-last 6 months yes     Dental Care Counseling: not indicated; dental care within six months     SBE monthly: yes     SBE Education/Counseling: not indicated; SBE done regularly  Safety-Violence-Falls     Seat Belt Use: 100     Smoke Detectors: yes     Violence in the Home: no risk noted     Sexual Abuse: no      Sexual History:  currently monogamous.    Current Medications (verified): 1)  Multivitamin .... Once  Daily 2)  Calcium .... Once Daily 3)  Vit C .... Once Daily 4)  Gingko Biloba 5)  Diovan Hct 80-12.5 Mg Tabs (Valsartan-Hydrochlorothiazide) .Marland Kitchen.. 1 By Mouth Once Daily 6)  Janumet 50-1000 Mg Tabs (Sitagliptin-Metformin Hcl) .Marland Kitchen.. 1 By Mouth Two Times A Day. 7)  Freestyle Lite Test  Strp (Glucose Blood) .... Pt Test 2-3 Times A Day 8)  Freestyle Lancets  Misc (Lancets) .... Pt Test 2-3 Times A Day  Allergies (verified): 1)  ! Sulfa  Family History: Reviewed history from 12/31/2006 and no changes required. Family History of CAD Female 1st degree relative  <50 Family History of Prostate CA 1st degree relative <50 Family History Diabetes 1st degree relative  Social History: Reviewed history from 02/13/2008 and no changes required. Married Never Smoked Alcohol use-yes Occupation:student-- community and justice studies Regular exercise-no Drug use-no Does Patient Exercise:  yes Dental Care w/in 6 mos.:  yes Sexual History:  currently monogamous  Review of Systems      See HPI General:  Denies chills, fatigue, fever, loss of appetite, malaise, sleep disorder, sweats, weakness, and weight loss. Eyes:  Denies blurring, discharge, double vision, eye irritation, eye pain, halos, itching, light sensitivity, red eye, vision loss-1 eye, and vision loss-both eyes. ENT:  Denies decreased hearing, difficulty swallowing, ear discharge, earache, hoarseness, nasal congestion, nosebleeds, postnasal drainage, ringing in ears, sinus pressure, and sore throat. CV:  Denies bluish discoloration of lips or nails, chest pain or discomfort, difficulty breathing at night, difficulty breathing while lying down, fainting, fatigue, leg cramps with exertion, lightheadness, near fainting, palpitations, shortness of breath with exertion, swelling of feet, swelling of hands, and weight gain. Resp:  Denies chest discomfort, chest pain with inspiration, cough, coughing up blood, excessive snoring, hypersomnolence, morning headaches, pleuritic, shortness of breath, sputum productive, and wheezing. GI:  Denies abdominal pain, bloody stools, change in bowel habits, constipation, dark tarry stools, diarrhea, excessive appetite, gas, hemorrhoids, indigestion, loss of appetite, and nausea. GU:  Denies abnormal vaginal bleeding, decreased libido, discharge, dysuria, genital sores, hematuria, incontinence, nocturia, urinary frequency, and urinary hesitancy. MS:  Denies joint pain, joint redness, joint swelling, loss of strength, low back pain, mid back pain, muscle aches, muscle ,  cramps, muscle weakness, stiffness, and thoracic pain. Derm:  Denies changes in color of skin, changes in nail beds, dryness, excessive perspiration, flushing, hair loss, insect bite(s), itching, lesion(s), poor wound healing, and rash. Neuro:  Denies brief paralysis, difficulty with concentration, disturbances in coordination, falling down, headaches, inability to speak, memory loss, numbness, poor balance, seizures, sensation of room spinning, tingling, tremors, visual disturbances, and weakness. Psych:  Denies alternate hallucination ( auditory/visual), anxiety, depression, easily angered, easily tearful, irritability, mental problems, panic attacks, sense of great danger, suicidal thoughts/plans, thoughts of violence, unusual visions or sounds, and thoughts /plans of harming others. Endo:  Denies cold intolerance, excessive hunger, excessive thirst, excessive urination, heat intolerance, polyuria, and weight change. Heme:  Denies abnormal bruising, bleeding, enlarge lymph nodes, fevers, pallor, and skin discoloration. Allergy:  Denies hives or rash, itching eyes, persistent infections, seasonal allergies, and sneezing.  Physical Exam  General:  Well-developed,well-nourished,in no acute distress; alert,appropriate and cooperative throughout examination Head:  Normocephalic and atraumatic without obvious abnormalities. No apparent alopecia or balding. Eyes:  vision grossly intact, pupils equal, pupils round, pupils reactive to light, and no injection.   Ears:  External ear exam shows no significant lesions or deformities.  Otoscopic examination reveals clear canals, tympanic membranes are intact bilaterally without bulging, retraction,  inflammation or discharge. Hearing is grossly normal bilaterally. Nose:  External nasal examination shows no deformity or inflammation. Nasal mucosa are pink and moist without lesions or exudates. Mouth:  Oral mucosa and oropharynx without lesions or exudates.  Teeth  in good repair. Neck:  No deformities, masses, or tenderness noted.no carotid bruits.   Chest Wall:  No deformities, masses, or tenderness noted. Breasts:  No mass, nodules, thickening, tenderness, bulging, retraction, inflamation, nipple discharge or skin changes noted.   Lungs:  Normal respiratory effort, chest expands symmetrically. Lungs are clear to auscultation, no crackles or wheezes. Heart:  normal rate and no murmur.   Abdomen:  Bowel sounds positive,abdomen soft and non-tender without masses, organomegaly or hernias noted. Rectal:  No external abnormalities noted. Normal sphincter tone. No rectal masses or tenderness. heme negative brown stool Genitalia:  Pelvic Exam:        External: normal female genitalia without lesions or masses        Vagina: normal without lesions or masses        Cervix: normal without lesions or masses        Adnexa: normal bimanual exam without masses or fullness        Uterus: normal by palpation        Pap smear: performed Msk:  normal ROM, no joint tenderness, no joint swelling, no joint warmth, no redness over joints, no joint deformities, no joint instability, and no crepitation.   Pulses:  R posterior tibial normal, R dorsalis pedis normal, R carotid normal, L posterior tibial normal, L dorsalis pedis normal, and L carotid normal.   Extremities:  No clubbing, cyanosis, edema, or deformity noted with normal full range of motion of all joints.   Neurologic:  alert & oriented X3, strength normal in all extremities, and gait normal.   Skin:  Intact without suspicious lesions or rashes Cervical Nodes:  No lymphadenopathy noted Axillary Nodes:  No palpable lymphadenopathy Psych:  Cognition and judgment appear intact. Alert and cooperative with normal attention span and concentration. No apparent delusions, illusions, hallucinations  Diabetes Management Exam:    Foot Exam (with socks and/or shoes not present):       Sensory-Pinprick/Light touch:           Left medial foot (L-4): normal          Left dorsal foot (L-5): normal          Left lateral foot (S-1): normal          Right medial foot (L-4): normal          Right dorsal foot (L-5): normal          Right lateral foot (S-1): normal       Sensory-Monofilament:          Left foot: normal          Right foot: normal       Inspection:          Left foot: normal          Right foot: normal       Nails:          Left foot: normal          Right foot: normal    Eye Exam:       Eye Exam done elsewhere          Date: 02/12/2009          Results: normal  Done by: optho   Impression & Recommendations:  Problem # 1:  PREVENTIVE HEALTH CARE (ICD-V70.0) ghm utd d/w shingles vaccine -- she will check with insurance and call back if they pay for it Orders: Venipuncture (09323) TLB-Lipid Panel (80061-LIPID) TLB-BMP (Basic Metabolic Panel-BMET) (80048-METABOL) TLB-CBC Platelet - w/Differential (85025-CBCD) TLB-Hepatic/Liver Function Pnl (80076-HEPATIC) TLB-TSH (Thyroid Stimulating Hormone) (84443-TSH) TLB-A1C / Hgb A1C (Glycohemoglobin) (83036-A1C) TLB-Microalbumin/Creat Ratio, Urine (82043-MALB) TLB-T3, Free (Triiodothyronine) (84481-T3FREE) TLB-T4 (Thyrox), Free (55732-KG2R) EKG w/ Interpretation (93000)  Problem # 2:  HYPERLIPIDEMIA (ICD-272.4) pt understands she mostl likely will need a medication con't with diet and exercise Orders: Venipuncture (42706) TLB-Lipid Panel (80061-LIPID) TLB-BMP (Basic Metabolic Panel-BMET) (80048-METABOL) TLB-CBC Platelet - w/Differential (85025-CBCD) TLB-Hepatic/Liver Function Pnl (80076-HEPATIC) TLB-TSH (Thyroid Stimulating Hormone) (84443-TSH) TLB-A1C / Hgb A1C (Glycohemoglobin) (83036-A1C) TLB-Microalbumin/Creat Ratio, Urine (82043-MALB) TLB-T3, Free (Triiodothyronine) (84481-T3FREE) TLB-T4 (Thyrox), Free (23762-GB1D) EKG w/ Interpretation (93000)  Labs Reviewed: SGOT: 19 (10/28/2009)   SGPT: 18 (10/28/2009)  Prior 10 Yr Risk  Heart Disease: Not enough information (05/04/2008)   HDL:80.00 (10/28/2009), 75.00 (03/21/2009)  LDL:DEL (02/13/2008), DEL (07/13/2006)  Chol:224 (10/28/2009), 244 (03/21/2009)  Trig:81.0 (10/28/2009), 42.0 (03/21/2009)  Problem # 3:  DIABETES MELLITUS, TYPE II (ICD-250.00)  Her updated medication list for this problem includes:    Diovan Hct 80-12.5 Mg Tabs (Valsartan-hydrochlorothiazide) .Marland Kitchen... 1 by mouth once daily    Janumet 50-1000 Mg Tabs (Sitagliptin-metformin hcl) .Marland Kitchen... 1 by mouth two times a day.  Orders: Venipuncture (17616) TLB-Lipid Panel (80061-LIPID) TLB-BMP (Basic Metabolic Panel-BMET) (80048-METABOL) TLB-CBC Platelet - w/Differential (85025-CBCD) TLB-Hepatic/Liver Function Pnl (80076-HEPATIC) TLB-TSH (Thyroid Stimulating Hormone) (84443-TSH) TLB-A1C / Hgb A1C (Glycohemoglobin) (83036-A1C) TLB-Microalbumin/Creat Ratio, Urine (82043-MALB) TLB-T3, Free (Triiodothyronine) (84481-T3FREE) TLB-T4 (Thyrox), Free [phone]) EKG w/ Interpretation (93000)  Labs Reviewed: Creat: 0.8 (10/28/2009)     Last Eye Exam: normal (02/12/2009) Reviewed HgBA1c results: 5.8 (10/28/2009)  5.7 (03/21/2009)  Problem # 4:  GOITER, MULTINODULAR (ICD-241.1) Assessment: Unchanged  Orders: Venipuncture (94854) TLB-Lipid Panel (80061-LIPID) TLB-BMP (Basic Metabolic Panel-BMET) (80048-METABOL) TLB-CBC Platelet - w/Differential (85025-CBCD) TLB-Hepatic/Liver Function Pnl (80076-HEPATIC) TLB-TSH (Thyroid Stimulating Hormone) (84443-TSH) TLB-A1C / Hgb A1C (Glycohemoglobin) (83036-A1C) TLB-Microalbumin/Creat Ratio, Urine (82043-MALB) TLB-T3, Free (Triiodothyronine) (84481-T3FREE) TLB-T4 (Thyrox), Free [phone]) EKG w/ Interpretation (93000)  Problem # 5:  HYPERTENSION (ICD-401.9)  Her updated medication list for this problem includes:    Diovan Hct 80-12.5 Mg Tabs (Valsartan-hydrochlorothiazide) .Marland Kitchen... 1 by mouth once daily  Orders: Venipuncture (38182) TLB-Lipid Panel  (80061-LIPID) TLB-BMP (Basic Metabolic Panel-BMET) (80048-METABOL) TLB-CBC Platelet - w/Differential (85025-CBCD) TLB-Hepatic/Liver Function Pnl (80076-HEPATIC) TLB-TSH (Thyroid Stimulating Hormone) (84443-TSH) TLB-A1C / Hgb A1C (Glycohemoglobin) (83036-A1C) TLB-Microalbumin/Creat Ratio, Urine (82043-MALB) TLB-T3, Free (Triiodothyronine) (84481-T3FREE) TLB-T4 (Thyrox), Free [id number]) EKG w/ Interpretation (93000)  BP today: 120/78 Prior BP: 118/82 (12/24/2009)  Prior 10 Yr Risk Heart Disease: Not enough information (05/04/2008)  Labs Reviewed: K+: 4.2 (10/28/2009) Creat: : 0.8 (10/28/2009)   Chol: 224 (10/28/2009)   HDL: 80.00 (10/28/2009)   LDL: DEL (02/13/2008)   TG: 81.0 (10/28/2009)  Complete Medication List: 1)  Multivitamin  .... Once daily 2)  Calcium  .... Once daily 3)  Vit C  .... Once daily 4)  Gingko Biloba  5)  Diovan Hct 80-12.5 Mg Tabs (Valsartan-hydrochlorothiazide) .Marland Kitchen.. 1 by mouth once daily 6)  Janumet 50-1000 Mg Tabs (Sitagliptin-metformin hcl) .Marland Kitchen.. 1 by mouth two times a day. 7)  Freestyle Lite Test Strp (Glucose blood) .... Pt test 2-3 times a day 8)  Freestyle Lancets Misc (Lancets) .... Pt test 2-3 times a day   EKG  Procedure date:  02/20/2010  Findings:      Normal sinus rhythm with rate of:  61 bpm    Flu Vaccine Next Due:  Refused  Appended Document: cpx & lab/cbs   Appended Document: cpx & lab/cbs   Appended Document: cpx & lab/cbs  Laboratory Results   Urine Tests   Date/Time Reported: February 20, 2010 10:14 AM   Routine Urinalysis   Color: yellow Appearance: Clear Glucose: negative   (Normal Range: Negative) Bilirubin: negative   (Normal Range: Negative) Ketone: negative   (Normal Range: Negative) Spec. Gravity: <1.005   (Normal Range: 1.003-1.035) Blood: negative   (Normal Range: Negative) pH: 6.0   (Normal Range: 5.0-8.0) Protein: negative   (Normal Range: Negative) Urobilinogen: negative   (Normal Range:  0-1) Nitrite: negative   (Normal Range: Negative) Leukocyte Esterace: negative   (Normal Range: Negative)    Comments: Floydene Flock  February 20, 2010 10:15 AM

## 2010-08-26 NOTE — Progress Notes (Signed)
Summary: Refill Request  Phone Note Refill Request Call back at [phone] Message from:  Pharmacy on Dec 12, 2009 9:57 AM  Refills Requested: Medication #1:  DIOVAN HCT 80-12.5 MG TABS 1 by mouth once daily   Dosage confirmed as above?Dosage Confirmed   Supply Requested: 3 months   Last Refilled: 12/11/2009 [facility] [address]   Next Appointment Scheduled: none Initial call taken by: Harold Barban,  Dec 12, 2009 9:57 AM    Prescriptions: DIOVAN HCT 80-12.5 MG TABS (VALSARTAN-HYDROCHLOROTHIAZIDE) 1 by mouth once daily  #30 x 0   Entered by:   Army Fossa CMA   Authorized by:   Loreen Freud DO   Signed by:   Army Fossa CMA on 12/12/2009   Method used:   Electronically to        [facility] (retail)       [address]       [place], [place]  [address]       Ph: [phone]       Fax: [phone]   RxID:   [id number]

## 2010-08-26 NOTE — Progress Notes (Signed)
Summary: bp check scheduled [id number] - cpx [id number]  Phone Note Outgoing Call   Summary of Call: Pt needs to schedule a follow up for BP. Army Fossa CMA  Dec 12, 2009 10:27 AM   Follow-up for Phone Call        lm am to schedule appt  Follow-up by: Okey Regal Spring,  Dec 12, 2009 3:12 PM  Additional Follow-up for Phone Call Additional follow up Details #1::        patient retd call bp check scheduled [id number] - cpx [id number] Additional Follow-up by: Okey Regal Spring,  Dec 17, 2009 10:22 AM

## 2010-08-26 NOTE — Progress Notes (Signed)
Summary: Schedule recall colonscopy   Phone Note Outgoing Call Call back at Home Phone [phone]   Call placed by: Christie Nottingham CMA Duncan Dull),  November 19, 2009 3:29 PM Call placed to: Patient Summary of Call: Called pt to schedule recall colonoscopy and pt refused to schedule at this time. I informed pt of the importance of scheduling this procedure due to her prior history of colon polyps. Pt agreed and states she will call back to schedule.  Initial call taken by: Christie Nottingham CMA Duncan Dull),  November 19, 2009 3:32 PM

## 2010-08-26 NOTE — Progress Notes (Signed)
Summary: Refill Request  Phone Note Refill Request Message from:  Pharmacy on [facility] [address] Fax #: [phone]  Refills Requested: Medication #1:  FREESTYLE LITE TEST  STRP pt test 2-3 times a day   Dosage confirmed as above?Dosage Confirmed   Supply Requested: 100   Last Refilled: 10/08/2008 Next Appointment Scheduled: none Initial call taken by: Harold Barban,  October 21, 2009 8:57 AM    Prescriptions: FREESTYLE LITE TEST  STRP (GLUCOSE BLOOD) pt test 2-3 times a day  #100 x 2   Entered by:   Army Fossa CMA   Authorized by:   Loreen Freud DO   Signed by:   Army Fossa CMA on 10/21/2009   Method used:   Electronically to        [facility] [address] (retail)       [address]       [place], [place]  [address]       Ph: [phone]       Fax: [phone]   RxID:   [id number]

## 2010-08-26 NOTE — Progress Notes (Signed)
Summary: diovan hct on back order  Phone Note Refill Request Message from:  Fax from Pharmacy on June 27, 2010 10:48 AM  Refills Requested: Medication #1:  DIOVAN HCT 80-12.5 MG TABS 1 by mouth once daily [facility] - fax [phone] - tel [phone] ----Note from pharmacy "Diovan hct on backorder no estimated return date please advise"   Initial call taken by: Okey Regal Spring,  June 27, 2010 10:49 AM  Follow-up for Phone Call        Rx on back order. Please advise on alternative. Follow-up by: Almeta Monas CMA Duncan Dull),  June 27, 2010 11:12 AM  Additional Follow-up for Phone Call Additional follow up Details #1::        do diovan 80 mg #90 2 refills  and hctz 25mg   1/2 by mouth once daily  #100   no refills Additional Follow-up by: Loreen Freud DO,  June 27, 2010 12:29 PM    Additional Follow-up for Phone Call Additional follow up Details #2::    pt aware and I advised if any problems or concerns to call...Marland KitchenMarland KitchenShe voiced understanding Follow-up by: Almeta Monas CMA Duncan Dull),  June 27, 2010 3:54 PM  New/Updated Medications: DIOVAN 80 MG TABS (VALSARTAN) 1 by mouth once daily HYDROCHLOROTHIAZIDE 25 MG TABS (HYDROCHLOROTHIAZIDE) 1/2 tablet once daily Prescriptions: HYDROCHLOROTHIAZIDE 25 MG TABS (HYDROCHLOROTHIAZIDE) 1/2 tablet once daily  #100 x 0   Entered by:   Almeta Monas CMA (AAMA)   Authorized by:   Loreen Freud DO   Signed by:   Almeta Monas CMA (AAMA) on 06/27/2010   Method used:   Faxed to ...       [facility] (ret[facility])       [address]       [place], [place]  [address]       Ph: [phone]       Fax: [phone]   RxID:   [id number] DIOVAN 80 MG TABS (VALSARTAN) 1 by mouth once daily  #90 x 2   Entered by:   Almeta Monas CMA (AAMA)   Authorized by:   Loreen Freud DO   Signed by:   Almeta Monas CMA (AAMA) on 06/27/2010   Method used:   Faxed to ...       [facility]  (retail)       [address]       [place], [place]  [address]       Ph: [phone]       Fax: [phone]   RxID:   [id number]

## 2010-08-26 NOTE — Letter (Signed)
Summary: [facility]: Immunoassay Fecal Occult Blood (iFOB) Order Form  [facility] [place]  [address] [place], [place] [address]   Phone: [phone]  Fax: [phone]      [facility]: Immunoassay Fecal Occult Blood (iFOB) Order Form   March 04, 2010 MRN: [id number]   Adriana Collins [birth year]   Physicican Name:________dr lowne_________________  Diagnosis Code:___________v76.51_______________      Jeremy Johann CMA

## 2010-08-28 NOTE — Assessment & Plan Note (Signed)
Summary: left shoulder pain//lch   Vital Signs:  Patient profile:   60 year old female Menstrual status:  postmenopausal Weight:      191.0 pounds Pulse rate:   72 / minute Pulse rhythm:   regular BP sitting:   120 / 80  (right arm) Cuff size:   regular  Vitals Entered By: Almeta Monas CMA Duncan Dull) (August 12, 2010 10:48 AM) CC: c/o left shoulder pain that comes and goes x1 year   History of Present Illness: Pt here c/o L shoulder pain x 1 year.  No known injury--Pt states it feels tight and has trouble hooking her bra etc.   Pt watches grandson during day---- 43 mon old.    Current Medications (verified): 1)  Multivitamin .... Once Daily 2)  Calcium .... Once Daily 3)  Vit C .... Once Daily 4)  Gingko Biloba 5)  Janumet 50-1000 Mg Tabs (Sitagliptin-Metformin Hcl) .Marland Kitchen.. 1 By Mouth Two Times A Day. 6)  Freestyle Lite Test  Strp (Glucose Blood) .... Pt Test 2-3 Times A Day 7)  Freestyle Lancets  Misc (Lancets) .... Pt Test 2-3 Times A Day 8)  Naftin 1 % Crea (Naftifine Hcl) .... Apply Daily 9)  Diovan 80 Mg Tabs (Valsartan) .Marland Kitchen.. 1 By Mouth Once Daily 10)  Hydrochlorothiazide 25 Mg Tabs (Hydrochlorothiazide) .... 1/2 Tablet Once Daily 11)  Mobic 15 Mg Tabs (Meloxicam) .Marland Kitchen.. 1 By Mouth Once Daily Prn  Allergies (verified): 1)  ! Sulfa  Past History:  Past Medical History: Last updated: 08/27/2008 Hypertension Diabetes mellitus, type II  Past Surgical History: Last updated: 02/13/2008 Lumpectomy, L ovarian cyst  Family History: Last updated: 02/20/2010 Family History of CAD Female 1st degree relative <50 Family History of Prostate CA 1st degree relative <50 Family History Diabetes 1st degree relative  Social History: Last updated: 02/20/2010 Married Never Smoked Alcohol use-yes Occupation:student-- community and justice studies Regular exercise-no Drug use-no  Risk Factors: Alcohol Use: <1 (02/20/2010) Caffeine Use: 0 (02/20/2010) Diet: ok--pt knows she needs  to work on it (02/20/2010) Exercise: yes (02/20/2010)  Risk Factors: Smoking Status: never (02/20/2010)  Family History: Reviewed history from 02/20/2010 and no changes required. Family History of CAD Female 1st degree relative <50 Family History of Prostate CA 1st degree relative <50 Family History Diabetes 1st degree relative  Social History: Reviewed history from 02/20/2010 and no changes required. Married Never Smoked Alcohol use-yes Occupation:student-- community and justice studies Regular exercise-no Drug use-no  Review of Systems      See HPI  Physical Exam  General:  Well-developed,well-nourished,in no acute distress; alert,appropriate and cooperative throughout examination Ears:  External ear exam shows no significant lesions or deformities.  Otoscopic examination reveals clear canals, tympanic membranes are intact bilaterally without bulging, retraction, inflammation or discharge. Hearing is grossly normal bilaterally. Nose:  External nasal examination shows no deformity or inflammation. Nasal mucosa are pink and moist without lesions or exudates. Mouth:  Oral mucosa and oropharynx without lesions or exudates.  Teeth in good repair. Msk:  tenderness ant shoulder pain with movement but she does almost have complete rom  Psych:  Cognition and judgment appear intact. Alert and cooperative with normal attention span and concentration. No apparent delusions, illusions, hallucinations   Impression & Recommendations:  Problem # 1:  SHOULDER PAIN, LEFT (ICD-719.41)  Discussed shoulder exercises, use of moist heat or ice, and medication.   Her updated medication list for this problem includes:    Mobic 15 Mg Tabs (Meloxicam) .Marland Kitchen... 1 by mouth once  daily prn  Orders: Physical Therapy Referral (PT)  Problem # 2:  URI (ICD-465.9)  claritin or zyrtec etc pt refused nose spray Her updated medication list for this problem includes:    Mobic 15 Mg Tabs (Meloxicam) .Marland Kitchen... 1  by mouth once daily prn  Instructed on symptomatic treatment. Call if symptoms persist or worsen.   Complete Medication List: 1)  Multivitamin  .... Once daily 2)  Calcium  .... Once daily 3)  Vit C  .... Once daily 4)  Gingko Biloba  5)  Janumet 50-1000 Mg Tabs (Sitagliptin-metformin hcl) .Marland Kitchen.. 1 by mouth two times a day. 6)  Freestyle Lite Test Strp (Glucose blood) .... Pt test 2-3 times a day 7)  Freestyle Lancets Misc (Lancets) .... Pt test 2-3 times a day 8)  Naftin 1 % Crea (Naftifine hcl) .... Apply daily 9)  Diovan 80 Mg Tabs (Valsartan) .Marland Kitchen.. 1 by mouth once daily 10)  Hydrochlorothiazide 25 Mg Tabs (Hydrochlorothiazide) .... 1/2 tablet once daily 11)  Mobic 15 Mg Tabs (Meloxicam) .Marland Kitchen.. 1 by mouth once daily prn  Patient Instructions: 1)  rto for labs----  250.00  272.4  lipid, bmp, hgba1c, hep, microalbumin Prescriptions: MOBIC 15 MG TABS (MELOXICAM) 1 by mouth once daily prn  #30 x 2   Entered and Authorized by:   Loreen Freud DO   Signed by:   Loreen Freud DO on 08/12/2010   Method used:   Electronically to        [facility] [address] (retail)       [address]       [place], [place]  [address]       Ph: [phone]       Fax: [phone]   RxID:   [id number]    Orders Added: 1)  Physical Therapy Referral [PT] 2)  Est. Patient Level III [84132]

## 2010-08-28 NOTE — Progress Notes (Signed)
Summary: Wants Ortho Referral  Phone Note Call from Patient   Caller: Patient Call For: Adriana Freud DO Summary of Call: per pt she was seen last week for shoulder pain and at the time she wanted to do PT, but she called back and stated she would rather see Ortho... c/b # [id number].Marland KitchenMarland Kitchenplease advise Initial call taken by: Almeta Monas CMA Duncan Dull),  August 21, 2010 3:56 PM

## 2010-09-11 NOTE — Miscellaneous (Signed)
Summary: Pt request a referral for Ortho/[place]  Pt request a referral for Ortho/[place]   Imported By: Sherian Rein 09/02/2010 11:19:51  _____________________________________________________________________  External Attachment:    Type:   Image     Comment:   External Document

## 2010-09-30 ENCOUNTER — Encounter: Payer: Self-pay | Admitting: Family Medicine

## 2010-10-02 ENCOUNTER — Other Ambulatory Visit (INDEPENDENT_AMBULATORY_CARE_PROVIDER_SITE_OTHER)

## 2010-10-02 ENCOUNTER — Other Ambulatory Visit: Payer: Self-pay | Admitting: Family Medicine

## 2010-10-02 ENCOUNTER — Encounter (INDEPENDENT_AMBULATORY_CARE_PROVIDER_SITE_OTHER): Payer: Self-pay | Admitting: *Deleted

## 2010-10-02 DIAGNOSIS — E785 Hyperlipidemia, unspecified: Secondary | ICD-10-CM

## 2010-10-02 DIAGNOSIS — Z833 Family history of diabetes mellitus: Secondary | ICD-10-CM

## 2010-10-02 LAB — BASIC METABOLIC PANEL
BUN: 17 mg/dL (ref 6–23)
Calcium: 9.3 mg/dL (ref 8.4–10.5)
GFR: 98.93 mL/min (ref >60.00)

## 2010-10-02 LAB — MICROALBUMIN / CREATININE URINE RATIO
Creatinine,U: 145.3 mg/dL
Microalb Creat Ratio: 0.5 mg/g (ref 0.0–30.0)

## 2010-10-02 LAB — HEPATIC FUNCTION PANEL
AST: 19 U/L (ref 0–37)
Albumin: 4.2 g/dL (ref 3.5–5.2)
Total Protein: 6.7 g/dL (ref 6.0–8.3)

## 2010-10-02 LAB — LIPID PANEL
Cholesterol: 245 mg/dL — ABNORMAL HIGH (ref 0–200)
VLDL: 8.6 mg/dL (ref 0.0–40.0)

## 2010-10-02 LAB — HEMOGLOBIN A1C: Hgb A1c MFr Bld: 6 % (ref 4.6–6.5)

## 2010-10-02 LAB — LDL CHOLESTEROL, DIRECT: Direct LDL: 154.5 mg/dL

## 2010-10-10 ENCOUNTER — Telehealth: Payer: Self-pay | Admitting: Family Medicine

## 2010-10-14 NOTE — Letter (Signed)
Summary: [facility]   Imported By: Maryln Gottron 10/03/2010 14:59:48  _____________________________________________________________________  External Attachment:    Type:   Image     Comment:   External Document

## 2010-10-14 NOTE — Progress Notes (Signed)
Summary: Blood pressure monitor  Phone Note Call from Patient Call back at Home Phone [phone]   Summary of Call: Patient called noting that her husbands blood pressure machine has broken and needs a new one. She had been using his in order to keep from buying a new one. She notes that she spoke with MD about this previously and was told that the MD could write her a script for blood pressure monitor and she is now in need of that script. Please send to [facility] on [address].  Ok to send? Particular brand/type  preferred? Please advise. Initial call taken by: Lucious Groves CMA,  October 10, 2010 12:46 PM  Follow-up for Phone Call        sent to pharmacy Follow-up by: Loreen Freud DO,  October 10, 2010 2:30 PM  Additional Follow-up for Phone Call Additional follow up Details #1::        pt aware... Additional Follow-up by: Almeta Monas CMA Duncan Dull),  October 10, 2010 2:39 PM    New/Updated Medications: BD ASSURE BPM/AUTO ARM CUFF  MISC (BLOOD PRESSURE MONITORING) as directed Prescriptions: BD ASSURE BPM/AUTO ARM CUFF  MISC (BLOOD PRESSURE MONITORING) as directed  #1 x 0   Entered and Authorized by:   Loreen Freud DO   Signed by:   Loreen Freud DO on 10/10/2010   Method used:   Electronically to        [facility] (retail)       [address]       [place], [place]  [address]       Ph: [phone]       Fax: [phone]   RxID:   [id number]

## 2010-10-27 ENCOUNTER — Telehealth: Payer: Self-pay

## 2010-10-27 NOTE — Telephone Encounter (Signed)
Call from patient and she stated she is seeing Ortho and they are considering Cortisone shots. Patient wanted to know if she could get Insulin sliding scale to have on hand if she needs it, and the directions on how to take it. She is due to start the cortison in the next week. C/b # [id number] please advise     KP

## 2010-10-27 NOTE — Telephone Encounter (Signed)
Spoke with patient and she is aware and I also printed a copy of the note so she can have the directions on how to take Insulin. Patient will pick up tomorrow      KP

## 2010-10-27 NOTE — Telephone Encounter (Signed)
Ok--- regular insulin pen needed 200-250   2 u  251-300 4 u 301-350  6 u 351-400  8 u > 400  10 u and call dr on call Check bld sugars 3-4 x a day---fasting and 2 hours after meals.

## 2010-10-30 ENCOUNTER — Other Ambulatory Visit: Payer: Self-pay

## 2010-10-30 MED ORDER — SITAGLIPTIN PHOS-METFORMIN HCL 50-1000 MG PO TABS: 1.0000 | ORAL_TABLET | Freq: Two times a day (BID) | ORAL | Status: DC

## 2010-11-10 ENCOUNTER — Other Ambulatory Visit: Payer: Self-pay

## 2010-11-10 LAB — DIFFERENTIAL
Basophils Absolute: 0.1 10*3/uL (ref 0.0–0.1)
Basophils Relative: 2 % — ABNORMAL HIGH (ref 0–1)
Monocytes Relative: 10 % (ref 3–12)
Neutro Abs: 3.1 10*3/uL (ref 1.7–7.7)
Neutrophils Relative %: 54 % (ref 43–77)

## 2010-11-10 LAB — CBC
MCHC: 32.8 g/dL (ref 30.0–36.0)
Platelets: 299 10*3/uL (ref 150–400)
RBC: 5.38 MIL/uL — ABNORMAL HIGH (ref 3.87–5.11)

## 2010-11-10 LAB — URINALYSIS, ROUTINE W REFLEX MICROSCOPIC
Ketones, ur: 15 mg/dL — AB
Leukocytes, UA: NEGATIVE
Nitrite: NEGATIVE
Specific Gravity, Urine: 1.038 — ABNORMAL HIGH (ref 1.005–1.030)
Urobilinogen, UA: 0.2 mg/dL (ref 0.0–1.0)
pH: 5.5 (ref 5.0–8.0)

## 2010-11-10 LAB — URINE MICROSCOPIC-ADD ON

## 2010-11-10 LAB — BASIC METABOLIC PANEL
CO2: 32 mEq/L (ref 19–32)
Calcium: 9.7 mg/dL (ref 8.4–10.5)
Creatinine, Ser: 0.9 mg/dL (ref 0.4–1.2)
GFR calc Af Amer: >60 mL/min (ref >60)

## 2010-11-10 LAB — GLUCOSE, CAPILLARY

## 2010-11-10 MED ORDER — MELOXICAM 15 MG PO TABS: 15.0000 mg | ORAL_TABLET | Freq: Every day | ORAL | Status: DC

## 2010-11-10 NOTE — Telephone Encounter (Signed)
Last filled and seen 08/12/10 Please advise     KP

## 2010-11-10 NOTE — Telephone Encounter (Signed)
Faxed.   KP 

## 2010-12-09 NOTE — Assessment & Plan Note (Signed)
[facility]                                 ON-CALL NOTE   Adriana, Collins                    MRN:          [id number]  DATE:08/25/2008                            DOB:          1950/09/27    PHONE NUMBER:  [phone].   REGULAR DOCTOR:  Dr. Laury Axon and Dr. Milinda Antis on call.   CALLER:  Thelma Barge from [employer].   She was calling in a critical high glucose of 792.  She said  unfortunately they did not have enough serum to repeat it.  I called the  patient at home at [phone] and spoke to her directly.  I told her about  her high sugars.  She has no history of diabetes known  She said she had  been burpy lately, but denies nausea, vomiting, fever, or any other  symptoms.  I told her that she needs to go to get evaluation now at  either emergency room or an urgent care.  She said with limitations of  the weather that an urgent care is closer and a family member is going  to take her there now to repeat blood sugar and see if this is accurate.     Adriana A. Tower, MD  Electronically Signed    MAT/MedQ  DD: 08/25/2008  DT: 08/25/2008  Job #: [id number]

## 2010-12-12 NOTE — Consult Note (Signed)
[facility]                          ENDOCRINOLOGY CONSULTATION   Adriana Collins, Adriana Collins                  MRN:          [id number]  DATE:08/18/2006                            DOB:          1950-08-28    REFERRING PHYSICIAN:  Loreen Freud, M.D.   REASON FOR REFERRAL:  Goiter.   HISTORY OF PRESENT ILLNESS:  A 60 year old woman who was noted on a  routine examination with Dr. Laury Axon to have a goiter.  She states that  she has not noticed the goiter.   PAST MEDICAL HISTORY:  Hypertension.   SOCIAL HISTORY:  She is a full time Consulting civil engineer.  She is married.   FAMILY HISTORY:  Negative for thyroid disease.   REVIEW OF SYSTEMS:  Denies shortness of breath and dysphagia.   PHYSICAL EXAMINATION:  Blood pressure 125/83, heart rate 67, temperature  97.9.  The weight is 205.  GENERAL:  No distress.  NECK:  I do not appreciate a goiter on my examination.  NEUROLOGIC:  Alert and oriented does not appear anxious nor depressed  and there is no tremor.   LABORATORY STUDIES:  A thyroid ultrasound shows a multinodular goiter  with multiple very small nodules.  TSH forwarded by Dr. Laury Axon is normal.   IMPRESSION:  1. Multinodular goiter.  2. She is euthyroid.   PLAN:  1. We talked about the natural history of a multinodular goiter and      the risk of hyperthyroidism.  2. Return in a year.     Sean A. Everardo All, MD  Electronically Signed    SAE/MedQ  DD: 08/20/2006  DT: 08/20/2006  Job #: [id number]   cc:   Loreen Freud, M.D.

## 2010-12-19 ENCOUNTER — Other Ambulatory Visit: Payer: Self-pay | Admitting: Family Medicine

## 2011-01-19 ENCOUNTER — Other Ambulatory Visit: Payer: Self-pay | Admitting: Family Medicine

## 2011-01-19 MED ORDER — VALSARTAN 80 MG PO TABS: 80.0000 mg | ORAL_TABLET | Freq: Every day | ORAL | Status: DC

## 2011-01-19 NOTE — Telephone Encounter (Signed)
Rx faxed

## 2011-01-22 ENCOUNTER — Other Ambulatory Visit: Payer: Self-pay | Admitting: Family Medicine

## 2011-02-18 ENCOUNTER — Other Ambulatory Visit: Payer: Self-pay | Admitting: Family Medicine

## 2011-02-19 ENCOUNTER — Other Ambulatory Visit: Payer: Self-pay | Admitting: Family Medicine

## 2011-02-19 MED ORDER — VALSARTAN 80 MG PO TABS: 80.0000 mg | ORAL_TABLET | Freq: Every day | ORAL | Status: DC

## 2011-02-19 NOTE — Telephone Encounter (Signed)
appt scheduled for 03/06/11     KP

## 2011-03-06 ENCOUNTER — Ambulatory Visit (INDEPENDENT_AMBULATORY_CARE_PROVIDER_SITE_OTHER): Admitting: Family Medicine

## 2011-03-06 ENCOUNTER — Encounter: Payer: Self-pay | Admitting: Family Medicine

## 2011-03-06 ENCOUNTER — Other Ambulatory Visit (HOSPITAL_COMMUNITY)
Admission: RE | Admit: 2011-03-06 | Discharge: 2011-03-06 | Disposition: A | Discharge status: Home / Self Care | Source: Ambulatory Visit | Attending: Family Medicine | Admitting: Family Medicine

## 2011-03-06 VITALS — BP 120/80 | HR 72 | Ht 65.0 in | Wt 182.8 lb

## 2011-03-06 DIAGNOSIS — E785 Hyperlipidemia, unspecified: Secondary | ICD-10-CM

## 2011-03-06 DIAGNOSIS — Z0000 Encounter for general adult medical examination without abnormal findings: Secondary | ICD-10-CM

## 2011-03-06 DIAGNOSIS — I10 Essential (primary) hypertension: Secondary | ICD-10-CM

## 2011-03-06 DIAGNOSIS — E119 Type 2 diabetes mellitus without complications: Secondary | ICD-10-CM

## 2011-03-06 DIAGNOSIS — Z01419 Encounter for gynecological examination (general) (routine) without abnormal findings: Secondary | ICD-10-CM | POA: Insufficient documentation

## 2011-03-06 LAB — POCT URINALYSIS DIPSTICK
Glucose, UA: NEGATIVE
Nitrite, UA: NEGATIVE
Protein, UA: NEGATIVE
Spec Grav, UA: 1.01
Urobilinogen, UA: 0.2

## 2011-03-06 LAB — HEPATIC FUNCTION PANEL
ALT: 16 U/L (ref 0–35)
AST: 21 U/L (ref 0–37)
Albumin: 4.5 g/dL (ref 3.5–5.2)
Alkaline Phosphatase: 59 U/L (ref 39–117)
Bilirubin, Direct: 0 mg/dL (ref 0.0–0.3)
Total Protein: 7.3 g/dL (ref 6.0–8.3)

## 2011-03-06 LAB — CBC WITH DIFFERENTIAL/PLATELET
Basophils Absolute: 0 10*3/uL (ref 0.0–0.1)
Basophils Relative: 0.6 % (ref 0.0–3.0)
Eosinophils Relative: 3.8 % (ref 0.0–5.0)
HCT: 37.2 % (ref 36.0–46.0)
Hemoglobin: 12.4 g/dL (ref 12.0–15.0)
Lymphocytes Relative: 41 % (ref 12.0–46.0)
Lymphs Abs: 1.7 10*3/uL (ref 0.7–4.0)
Monocytes Relative: 8.3 % (ref 3.0–12.0)
Neutro Abs: 1.9 10*3/uL (ref 1.4–7.7)
RBC: 4.11 Mil/uL (ref 3.87–5.11)
RDW: 14 % (ref 11.5–14.6)

## 2011-03-06 LAB — MICROALBUMIN / CREATININE URINE RATIO
Creatinine,U: 105.4 mg/dL
Microalb Creat Ratio: 0.4 mg/g (ref 0.0–30.0)
Microalb, Ur: 0.4 mg/dL (ref 0.0–1.9)

## 2011-03-06 LAB — TSH: TSH: 0.52 u[IU]/mL (ref 0.35–5.50)

## 2011-03-06 LAB — LDL CHOLESTEROL, DIRECT: Direct LDL: 184.3 mg/dL

## 2011-03-06 LAB — BASIC METABOLIC PANEL
GFR: 105.06 mL/min (ref >60.00)
Glucose, Bld: 87 mg/dL (ref 70–99)
Potassium: 3.9 mEq/L (ref 3.5–5.1)
Sodium: 140 mEq/L (ref 135–145)

## 2011-03-06 MED ORDER — NAFTIFINE HCL 1 % EX CREA: TOPICAL_CREAM | Freq: Every day | CUTANEOUS | Status: DC

## 2011-03-06 NOTE — Patient Instructions (Signed)

## 2011-03-06 NOTE — Progress Notes (Signed)
Subjective:     Adriana Collins is a 60 y.o. female and is here for a comprehensive physical exam. The patient reports no problems.  History   Social History  . Marital Status: Married    Spouse Name: N/A    Number of Children: N/A  . Years of Education: N/A   Occupational History  . Not on file.   Social History Main Topics  . Smoking status: Never Smoker   . Smokeless tobacco: Not on file  . Alcohol Use: Yes  . Drug Use: No  . Sexually Active: Yes -- Female partner(s)   Other Topics Concern  . 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 Pap Smear  06/30/1970  . Colonoscopy  06/30/2002  . Influenza Vaccine  04/27/2011  . Mammogram  05/23/2011  . Tetanus/tdap  12/12/2017    The following portions of the patient's history were reviewed and updated as appropriate: allergies, current medications, past family history, past medical history, past social history, past surgical history and problem list.  Review of Systems Review of Systems  Constitutional: Negative for activity change, appetite change and fatigue.  HENT: Negative for hearing loss, congestion, tinnitus and ear discharge.  dentist q13m Eyes: Negative for visual disturbance (see optho q1y -- vision corrected to 20/20 with glasses).  Respiratory: Negative for cough, chest tightness and shortness of breath.   Cardiovascular: Negative for chest pain, palpitations and leg swelling.  Gastrointestinal: Negative for abdominal pain, diarrhea, constipation and abdominal distention.  Genitourinary: Negative for urgency, frequency, decreased urine volume and difficulty urinating.  Musculoskeletal: Negative for back pain, arthralgias and gait problem.  Skin: Negative for color change, pallor and rash.  Neurological: Negative for dizziness, light-headedness, numbness and headaches.  Hematological: Negative for  adenopathy. Does not bruise/bleed easily.  Psychiatric/Behavioral: Negative for suicidal ideas, confusion, sleep disturbance, self-injury, dysphoric mood, decreased concentration and agitation.       Objective:    BP 120/80  Pulse 72  Ht 5\' 5"  (1.651 m)  Wt 182 lb 12.8 oz (82.918 kg)  BMI 30.42 kg/m2 General appearance: alert, cooperative, appears stated age and no distress Head: Normocephalic, without obvious abnormality, atraumatic Eyes: conjunctivae/corneas clear. PERRL, EOM's intact. Fundi benign. Ears: normal TM's and external ear canals both ears Nose: Nares normal. Septum midline. Mucosa normal. No drainage or sinus tenderness. Throat: lips, mucosa, and tongue normal; teeth and gums normal Neck: no adenopathy, no carotid bruit, no JVD, supple, symmetrical, trachea midline and thyroid not enlarged, symmetric, no tenderness/mass/nodules Back: symmetric, no curvature. ROM normal. No CVA tenderness. Lungs: clear to auscultation bilaterally Breasts: normal appearance, no masses or tenderness Heart: regular rate and rhythm, S1, S2 normal, no murmur, click, rub or gallop Abdomen: soft, non-tender; bowel sounds normal; no masses,  no organomegaly Pelvic: cervix normal in appearance, external genitalia normal, no adnexal masses or tenderness, no cervical motion tenderness, rectovaginal septum normal, uterus normal size, shape, and consistency and vagina normal without discharge Extremities: extremities normal, atraumatic, no cyanosis or edema Pulses: 2+ and symmetric Skin: Skin color, texture, turgor normal. No rashes or lesions Lymph nodes: Cervical, supraclavicular, and axillary nodes normal. Neurologic: Alert and oriented X 3, normal strength and tone. Normal symmetric reflexes. Normal coordination and gait psych-- no anxiety, no depression   rectal---heme neg brown stool Assessment:    Healthy female exam.  DMII Hyperlipidemia HTN   Plan:  ghm utd Check fasting labs    See After Visit Summary for Counseling Recommendations

## 2011-03-09 ENCOUNTER — Encounter: Payer: Self-pay | Admitting: *Deleted

## 2011-03-09 ENCOUNTER — Telehealth: Payer: Self-pay | Admitting: *Deleted

## 2011-03-09 NOTE — Telephone Encounter (Signed)
DM controlled Cholesterol--- LDL goal < 70, HDL >40, TG < 150. Diet and exercise will increase HDL and decrease LDL and TG. Fish, Fish Oil, Flaxseed oil will also help increase the HDL and decrease Triglycerides. zocor is not strong enough. Change to lipitor 20 mg #30 1 po qhs, 2 refills. Recheck labs in 3 months. 272.4 250.00 Lipid, hep, bmp, hgba1c.    Left message to call back.

## 2011-03-10 MED ORDER — ATORVASTATIN CALCIUM 20 MG PO TABS: 20.0000 mg | ORAL_TABLET | Freq: Every day | ORAL | Status: DC

## 2011-03-10 NOTE — Telephone Encounter (Signed)
Discuss with patient  

## 2011-03-11 ENCOUNTER — Encounter: Payer: Self-pay | Admitting: *Deleted

## 2011-03-12 ENCOUNTER — Telehealth: Payer: Self-pay | Admitting: Family Medicine

## 2011-03-13 NOTE — Telephone Encounter (Signed)
Called patient and gave all requested values, patient aware labs mailed late yesterday

## 2011-03-30 ENCOUNTER — Emergency Department (HOSPITAL_BASED_OUTPATIENT_CLINIC_OR_DEPARTMENT_OTHER)
Admission: EM | Admit: 2011-03-30 | Discharge: 2011-03-30 | Disposition: A | Discharge status: Home / Self Care | Attending: Emergency Medicine | Admitting: Emergency Medicine

## 2011-03-30 ENCOUNTER — Encounter (HOSPITAL_BASED_OUTPATIENT_CLINIC_OR_DEPARTMENT_OTHER): Payer: Self-pay | Admitting: *Deleted

## 2011-03-30 DIAGNOSIS — I10 Essential (primary) hypertension: Secondary | ICD-10-CM | POA: Insufficient documentation

## 2011-03-30 DIAGNOSIS — T63301A Toxic effect of unspecified spider venom, accidental (unintentional), initial encounter: Secondary | ICD-10-CM

## 2011-03-30 DIAGNOSIS — T6391XA Toxic effect of contact with unspecified venomous animal, accidental (unintentional), initial encounter: Secondary | ICD-10-CM | POA: Insufficient documentation

## 2011-03-30 DIAGNOSIS — E119 Type 2 diabetes mellitus without complications: Secondary | ICD-10-CM | POA: Insufficient documentation

## 2011-03-30 DIAGNOSIS — R609 Edema, unspecified: Secondary | ICD-10-CM | POA: Insufficient documentation

## 2011-03-30 DIAGNOSIS — T63461A Toxic effect of venom of wasps, accidental (unintentional), initial encounter: Secondary | ICD-10-CM | POA: Insufficient documentation

## 2011-03-30 MED ORDER — DIPHENHYDRAMINE HCL 25 MG PO CAPS
25.0000 mg | ORAL_CAPSULE | Freq: Once | ORAL | Status: AC
  Administered 2011-03-30: 25 mg via ORAL
  Filled 2011-03-30 (×2): qty 1

## 2011-03-30 NOTE — ED Provider Notes (Signed)
History     CSN: [id number] Arrival date & time: 03/30/2011 12:36 PM  Chief Complaint  Patient presents with  . Hand Pain   HPI A 60 year old female with non-insulin-dependent diabetes and high blood pressure who had some redness and swelling to her left hand and left elbow this morning. She states she awoke with some itching and redness and felt something on her and smashed a spider. She brings the site or in she states that the redness and itching has gotten better since this morning. She states that she felt like she needed to have it checked out because of her diabetes and concern that this was a brown recluse spider. Past Medical History  Diagnosis Date  . Hypertension   . Diabetes mellitus    review reviewed  Past Surgical History  Procedure Date  . Breast lumpectomy     left  . Ovarian cyst surgery     Family History  Problem Relation Age of Onset  . Coronary artery disease    . Prostate cancer    . Diabetes Mother   . Heart disease Mother 5    MI  . Cancer Father 40    prostate  . Diabetes Maternal Grandmother   . Cancer Sister 74    colon CA  . Diabetes Brother    Reviewed History  Substance Use Topics  . Smoking status: Never Smoker   . Smokeless tobacco: Not on file  . Alcohol Use: Yes    OB History    Grav Para Term Preterm Abortions TAB SAB Ect Mult Living                  Review of Systems  All other systems reviewed and are negative.    Physical Exam  BP 113/80  Pulse 66  Temp(Src) 98.2 F (36.8 C) (Oral)  Resp 20  SpO2 100%  Physical Exam  Vitals reviewed. Constitutional: She is oriented to person, place, and time. She appears well-developed and well-nourished.  HENT:  Head: Normocephalic.  Eyes: Pupils are equal, round, and reactive to light.  Neck: Normal range of motion. Neck supple.  Cardiovascular: Normal rate.   Abdominal: Soft.  Musculoskeletal: Normal range of motion.       Left hand with slight erythema at the palmar  aspect on the ulnar side. There is also mild edema. There is no tenderness. The left elbow does not show any evidence of erythema or swelling.  Neurological: She is alert and oriented to person, place, and time.  Skin: Skin is warm.  Psychiatric: She has a normal mood and affect.    ED Course  Procedures  MDM Patient does not have any signs of acute infection. She does have a spider with her that I'm not able to identify. She was given Benadryl and is given instructions to return if there is increase in the redness or swelling.      Hilario Quarry, MD 03/30/11 1352

## 2011-03-30 NOTE — ED Notes (Signed)
Woke with am with pain in her left hand. Found a spider on the same arm and thinks she may have been bitten by the spider. Swelling and redness noted.

## 2011-04-23 ENCOUNTER — Other Ambulatory Visit: Payer: Self-pay | Admitting: Family Medicine

## 2011-05-06 ENCOUNTER — Ambulatory Visit (AMBULATORY_SURGERY_CENTER): Admitting: *Deleted

## 2011-05-06 ENCOUNTER — Encounter

## 2011-05-06 VITALS — Ht 65.0 in | Wt 184.3 lb

## 2011-05-06 DIAGNOSIS — Z1211 Encounter for screening for malignant neoplasm of colon: Secondary | ICD-10-CM

## 2011-05-06 MED ORDER — MOVIPREP 100 G PO SOLR: ORAL | Status: DC

## 2011-05-20 ENCOUNTER — Ambulatory Visit (AMBULATORY_SURGERY_CENTER): Admitting: Gastroenterology

## 2011-05-20 ENCOUNTER — Encounter: Payer: Self-pay | Admitting: Gastroenterology

## 2011-05-20 ENCOUNTER — Other Ambulatory Visit: Payer: Self-pay | Admitting: Family Medicine

## 2011-05-20 VITALS — BP 128/81 | HR 53 | Temp 95.0°F | Resp 15 | Ht 65.0 in | Wt 184.0 lb

## 2011-05-20 DIAGNOSIS — Z86010 Personal history of colonic polyps: Secondary | ICD-10-CM

## 2011-05-20 DIAGNOSIS — Z800 Family history of malignant neoplasm of digestive organs: Secondary | ICD-10-CM

## 2011-05-20 DIAGNOSIS — Z1211 Encounter for screening for malignant neoplasm of colon: Secondary | ICD-10-CM

## 2011-05-20 MED ORDER — SODIUM CHLORIDE 0.9 % IV SOLN: 500.0000 mL | INTRAVENOUS | Status: DC

## 2011-05-20 NOTE — Progress Notes (Signed)
Pt tolerated the colonoscopy very well. maw 

## 2011-05-20 NOTE — Patient Instructions (Signed)
FOLLOW YOUR DISCHARGE INSTRUCTIONS ON THE GREEN AND BLUE INSTRUCTIONS SHEETS.  CONTINUE YOUR MEDICATIONS.  NEXT COLONOSCOPY IN 5 YEARS.

## 2011-05-21 ENCOUNTER — Telehealth: Payer: Self-pay | Admitting: *Deleted

## 2011-05-21 NOTE — Telephone Encounter (Signed)

## 2011-06-25 ENCOUNTER — Other Ambulatory Visit: Payer: Self-pay | Admitting: Family Medicine

## 2011-07-29 ENCOUNTER — Other Ambulatory Visit: Payer: Self-pay | Admitting: Family Medicine

## 2011-07-31 ENCOUNTER — Other Ambulatory Visit: Payer: Self-pay | Admitting: Family Medicine

## 2011-08-13 ENCOUNTER — Other Ambulatory Visit: Payer: Self-pay | Admitting: Family Medicine

## 2011-08-17 ENCOUNTER — Other Ambulatory Visit: Payer: Self-pay | Admitting: Family Medicine

## 2011-08-17 DIAGNOSIS — E119 Type 2 diabetes mellitus without complications: Secondary | ICD-10-CM

## 2011-08-17 DIAGNOSIS — E785 Hyperlipidemia, unspecified: Secondary | ICD-10-CM

## 2011-08-18 ENCOUNTER — Other Ambulatory Visit

## 2011-08-19 ENCOUNTER — Other Ambulatory Visit

## 2011-08-19 ENCOUNTER — Other Ambulatory Visit (INDEPENDENT_AMBULATORY_CARE_PROVIDER_SITE_OTHER)

## 2011-08-19 DIAGNOSIS — E785 Hyperlipidemia, unspecified: Secondary | ICD-10-CM

## 2011-08-19 DIAGNOSIS — E119 Type 2 diabetes mellitus without complications: Secondary | ICD-10-CM

## 2011-08-19 LAB — BASIC METABOLIC PANEL
BUN: 13 mg/dL (ref 6–23)
Chloride: 101 mEq/L (ref 96–112)
Glucose, Bld: 77 mg/dL (ref 70–99)
Potassium: 3.8 mEq/L (ref 3.5–5.1)

## 2011-08-19 LAB — LIPID PANEL
HDL: 80.5 mg/dL (ref >39.00)
VLDL: 8.4 mg/dL (ref 0.0–40.0)

## 2011-08-19 LAB — HEPATIC FUNCTION PANEL
ALT: 16 U/L (ref 0–35)
Alkaline Phosphatase: 52 U/L (ref 39–117)
Bilirubin, Direct: 0.1 mg/dL (ref 0.0–0.3)
Total Bilirubin: 1.1 mg/dL (ref 0.3–1.2)

## 2011-08-19 LAB — LDL CHOLESTEROL, DIRECT: Direct LDL: 176.7 mg/dL

## 2011-08-31 DIAGNOSIS — E785 Hyperlipidemia, unspecified: Secondary | ICD-10-CM

## 2011-09-07 ENCOUNTER — Encounter: Payer: Self-pay | Admitting: *Deleted

## 2011-09-07 ENCOUNTER — Ambulatory Visit (INDEPENDENT_AMBULATORY_CARE_PROVIDER_SITE_OTHER): Admitting: Pharmacist

## 2011-09-07 DIAGNOSIS — E785 Hyperlipidemia, unspecified: Secondary | ICD-10-CM

## 2011-09-07 MED ORDER — ROSUVASTATIN CALCIUM 5 MG PO TABS: ORAL_TABLET | ORAL | Status: DC

## 2011-09-07 NOTE — Telephone Encounter (Signed)
Error

## 2011-09-07 NOTE — Patient Instructions (Addendum)
Start Crestor 5mg  on Monday, Wednesday and Friday.  If you have any problems taking the medication, please call Kennon Rounds at [phone].   Continue your healthy diet.   Try to start exercising at least 30 minutes 3 days of the week  We will call you with your appt for March.

## 2011-09-07 NOTE — Assessment & Plan Note (Addendum)
[name] had a lipid panel done on 08/19/11 which showed excellent control of her triglycerides at 42 and HDL at 80. Her LDL is 176.7 which has progressively increased over the past few years. Her optimal LDL goal is <70 (or at least 100) and slight modifications in diet and exercise will not get patient to goal. LFTs wnl.   We discussed medication options with the patient including statin, fibrate, or niacin with the ideal choice being crestor 5mg  3 times a week.  She is willing to try the crestor at 3 times a week and monitor for side effects.   Plan:  1. Begin crestor 5mg  by mouth every Monday, Wednesday and Friday 2. Try to exercise at least 30 minutes three times a week 3. Schedule a follow up lipid appointment and lipid panel in one month

## 2011-09-07 NOTE — Progress Notes (Deleted)
HPI   Current Outpatient Prescriptions  Medication Sig Dispense Refill  . Ascorbic Acid (VITAMIN C) 100 MG tablet Take 100 mg by mouth daily.        Marland Kitchen aspirin 81 MG chewable tablet Chew 81 mg by mouth daily.        . fish oil-omega-3 fatty acids 1000 MG capsule Take 1 g by mouth daily.      . Ginkgo Biloba 40 MG TABS Take by mouth.        Marland Kitchen glucose blood (FREESTYLE LITE) test strip 1 each by Other route as needed. Use as instructed       . hydrochlorothiazide (HYDRODIURIL) 25 MG tablet TAKE ONE-HALF TABLET DAILY  100 tablet  0  . JANUMET 50-1000 MG per tablet TAKE 1 TABLET BY MOUTH 2 (TWO) TIMES DAILY WITH A MEAL.  60 tablet  0  . Lancets (FREESTYLE) lancets 1 each by Other route as needed. Use as instructed       . MULTIPLE VITAMIN PO Take by mouth.        . valsartan (DIOVAN) 80 MG tablet Take 1 tablet (80 mg total) by mouth daily.  90 tablet  0    Allergies  Allergen Reactions  . Sulfonamide Derivatives     REACTION: HIVES

## 2011-09-08 NOTE — Progress Notes (Signed)
HPI: Adriana Collins is pleasant 61 year old female with a history of diabetes, hypertension, and dyslipidemia who presents to [facility] cardiology for a primary consultation at the lipid clinic. She has had great control of her diabetes with a recent A1c (08/19/11) of 5.8. Per patient, she had tried both Lipitor and Zocor before and may have had associated muscles pains but cannot fully recall. Patient is eager and willing to make necessary changes to improve her LDL.   Family History: Adriana Collins does not remark a family history of hypercholesterolemia. Her mother and her grandmother did have diabetes.   Social History: Non smoker and drinks 3oz of red wine occasionally.    Diet: Since the patient has such well control of her diabetes, her diet does not need excessive modification. She typically eats two meals a day.  Breakfast options include: a boiled egg, toast with apple butter, grits and eggs (+/- a biscuit), or yogurt Lunch: patient often skips  Dinner options include: baked chicken, salmon/talapia, or occasional red meat. Sides with her dinner include salads, greens, broccoli, sweet potatoes, or corn.  Sweets and snacks: patient does like chocolate but eats fruit as her sweet snack. Drinks include water and occasional lemonade on the weekends.   Exercise: patient admits to no regular fitness routine. Her husband goes to the gym everyday and she would like to get into to a routine. She enjoys the treadmill/eliptical and light weights.   Allergies  Allergen Reactions  . Sulfonamide Derivatives     REACTION: HIVES   Current Outpatient Prescriptions on File Prior to Visit  Medication Sig Dispense Refill  . Ascorbic Acid (VITAMIN C) 100 MG tablet Take 100 mg by mouth daily.        Marland Kitchen aspirin 81 MG chewable tablet Chew 81 mg by mouth daily.        . Ginkgo Biloba 40 MG TABS Take by mouth.        Marland Kitchen glucose blood (FREESTYLE LITE) test strip 1 each by Other route as needed. Use as instructed        . hydrochlorothiazide (HYDRODIURIL) 25 MG tablet TAKE ONE-HALF TABLET DAILY  100 tablet  0  . JANUMET 50-1000 MG per tablet TAKE 1 TABLET BY MOUTH 2 (TWO) TIMES DAILY WITH A MEAL.  60 tablet  0  . Lancets (FREESTYLE) lancets 1 each by Other route as needed. Use as instructed       . MULTIPLE VITAMIN PO Take by mouth.        . valsartan (DIOVAN) 80 MG tablet Take 1 tablet (80 mg total) by mouth daily.  90 tablet  0

## 2011-09-14 ENCOUNTER — Other Ambulatory Visit: Payer: Self-pay | Admitting: Family Medicine

## 2011-10-05 ENCOUNTER — Other Ambulatory Visit (INDEPENDENT_AMBULATORY_CARE_PROVIDER_SITE_OTHER)

## 2011-10-05 ENCOUNTER — Other Ambulatory Visit

## 2011-10-05 DIAGNOSIS — E785 Hyperlipidemia, unspecified: Secondary | ICD-10-CM

## 2011-10-05 LAB — HEPATIC FUNCTION PANEL
ALT: 16 U/L (ref 0–35)
AST: 17 U/L (ref 0–37)
Albumin: 4.2 g/dL (ref 3.5–5.2)
Alkaline Phosphatase: 51 U/L (ref 39–117)

## 2011-10-05 LAB — LIPID PANEL
Cholesterol: 201 mg/dL — ABNORMAL HIGH (ref 0–200)
Total CHOL/HDL Ratio: 2
Triglycerides: 39 mg/dL (ref 0.0–149.0)

## 2011-10-05 LAB — LDL CHOLESTEROL, DIRECT: Direct LDL: 105.1 mg/dL

## 2011-10-12 ENCOUNTER — Ambulatory Visit

## 2011-10-12 ENCOUNTER — Ambulatory Visit (INDEPENDENT_AMBULATORY_CARE_PROVIDER_SITE_OTHER): Admitting: Pharmacist

## 2011-10-12 VITALS — BP 124/90 | Wt 186.5 lb

## 2011-10-12 DIAGNOSIS — E785 Hyperlipidemia, unspecified: Secondary | ICD-10-CM

## 2011-10-12 NOTE — Assessment & Plan Note (Addendum)
Assessment:  60yoF with hyperlipidemia not yet at goal, but improved since last visit after initiating Crestor 5mg  MWF: [10/05/11: Total Chol 201 (Goal <200), Trig 39 (Goal <150), HDL 82.8 (Goal >45), LDL 105.1 (Goal <70)]; [08/19/11: Total Chol 268, Trig 42, HDL 80.5, LDL 176.7]. LFT's within normal limits.  Patient denies any issues with side effects and is willing to increase Crestor to >3 days/week.  Plan:  1. Increase Crestor 5mg  to 4-5 days per week. If experiences side effects, decrease back to 3x/week. 2. Try to exercise at least 30 minutes three times a week  3. Follow-up with office visit and fasting blood work in 3 months

## 2011-10-12 NOTE — Patient Instructions (Addendum)
Your cholesterol levels 10/05/11: Total cholesterol 201, Triglycerides 39, HDL 82.8, LDL 105.1 (Goal LDL less than 70)  1) Great job with increasing your exercise! Continue to try to work towards 30 minutes at least 3 times per week. 2) Increase your Crestor 5mg  to 4-5 days per week. If you start to experience any side effects, you can go back to 3 times a week and call the clinic to let us know. 3) Continue to choose healthy food choices.  Follow-up office visit and fasting lab work  in 2-3 months. We will call you once the schedule is up to make your appointment

## 2011-10-12 NOTE — Progress Notes (Signed)
HPI: [name] is a 60yoF with a history of diabetes (A1C 5.8 in Jan 2013), hypertension, and dyslipidemia who presented to the clinic for lipid management follow-up. She denies any issues/side effects since initiating Crestor 5mg  times weekly in February 2013.   Family History: [name] does not remark a family history of hypercholesterolemia. Her mother and her grandmother did have diabetes.  Social History: Non smoker and drinks 3oz of red wine occasionally.    Diet: (10/12/11) Patient reports no changes to diet. Since the patient has such well control of her diabetes, her diet does not need excessive modification. She typically eats two meals a day.  Breakfast options include: a boiled egg, toast with apple butter, grits and eggs (+/- a biscuit), or yogurt Lunch: patient often skips  Dinner options include: baked chicken, salmon/talapia, or occasional red meat. Sides with her dinner include salads, greens, broccoli, sweet potatoes, or corn.  Sweets and snacks: patient does like chocolate but eats fruit as her sweet snack. Drinks include water and occasional lemonade on the weekends.   Exercise: Has increased exercise to 30-60 minutes 2x/week. Generally does walking, elliptical, and some upper body strength training.   Lipid Panel     Component Value Date/Time   CHOL 201* 10/05/2011 1007   TRIG 39.0 10/05/2011 1007   HDL 82.80 10/05/2011 1007   CHOLHDL 2 10/05/2011 1007   VLDL 7.8 10/05/2011 1007       LDL-Direct             105.1     10/05/2011 1007  Allergies  Allergen Reactions  . Sulfonamide Derivatives     REACTION: HIVES   Current Outpatient Prescriptions on File Prior to Visit  Medication Sig Dispense Refill  . Ascorbic Acid (VITAMIN C) 100 MG tablet Take 1,000 mg by mouth daily. Take 1000mg  daily, then increases to 3000mg  daily if feeling illness      . aspirin 81 MG chewable tablet Chew 81 mg by mouth daily.        . fish oil-omega-3 fatty acids 1000 MG capsule Take 1  g by mouth daily.      . Ginkgo Biloba 40 MG TABS Take by mouth.        Marland Kitchen glucose blood (FREESTYLE LITE) test strip 1 each by Other route as needed. Use as instructed       . hydrochlorothiazide (HYDRODIURIL) 25 MG tablet TAKE ONE-HALF TABLET DAILY  100 tablet  0  . Lancets (FREESTYLE) lancets 1 each by Other route as needed. Use as instructed       . MULTIPLE VITAMIN PO Take by mouth.        . rosuvastatin (CRESTOR) 5 MG tablet Take 1 tablet by mouth on Monday, Wednesday and Friday  21 tablet  0  . sitaGLIPtan-metformin (JANUMET) 50-1000 MG per tablet Take 1 tablet by mouth 2 (two) times daily with a meal.  60 tablet  2  . valsartan (DIOVAN) 80 MG tablet Take 1 tablet (80 mg total) by mouth daily.  90 tablet  0  . sitaGLIPtan-metformin (JANUMET) 50-1000 MG per tablet Take 1 tablet by mouth 2 (two) times daily with a meal.  60 tablet  2

## 2011-10-13 ENCOUNTER — Other Ambulatory Visit: Payer: Self-pay | Admitting: Family Medicine

## 2011-10-13 DIAGNOSIS — Z1231 Encounter for screening mammogram for malignant neoplasm of breast: Secondary | ICD-10-CM

## 2011-10-21 ENCOUNTER — Ambulatory Visit: Admitting: Family Medicine

## 2011-10-21 ENCOUNTER — Other Ambulatory Visit (INDEPENDENT_AMBULATORY_CARE_PROVIDER_SITE_OTHER)

## 2011-10-21 DIAGNOSIS — E119 Type 2 diabetes mellitus without complications: Secondary | ICD-10-CM

## 2011-10-21 LAB — HEMOGLOBIN A1C: Hgb A1c MFr Bld: 5.8 % (ref 4.6–6.5)

## 2011-10-29 ENCOUNTER — Ambulatory Visit
Admission: RE | Admit: 2011-10-29 | Discharge: 2011-10-29 | Disposition: A | Discharge status: Home / Self Care | Source: Ambulatory Visit | Attending: Family Medicine | Admitting: Family Medicine

## 2011-10-29 DIAGNOSIS — Z1231 Encounter for screening mammogram for malignant neoplasm of breast: Secondary | ICD-10-CM

## 2011-11-06 ENCOUNTER — Other Ambulatory Visit: Payer: Self-pay | Admitting: Family Medicine

## 2011-11-10 ENCOUNTER — Other Ambulatory Visit: Payer: Self-pay | Admitting: Family Medicine

## 2011-11-10 NOTE — Telephone Encounter (Signed)
Refill done.  

## 2011-12-01 ENCOUNTER — Telehealth: Payer: Self-pay | Admitting: Pharmacist

## 2011-12-01 NOTE — Telephone Encounter (Signed)
Called patient to schedule follow-up lipid appointment. Patient reports that her insurance does not cover these visits and so will not be making any follow-ups in the lipid clinic. Will have to follow-up with PCP.

## 2012-01-16 ENCOUNTER — Other Ambulatory Visit: Payer: Self-pay | Admitting: Family Medicine

## 2012-02-12 ENCOUNTER — Other Ambulatory Visit: Payer: Self-pay | Admitting: Family Medicine

## 2012-02-26 ENCOUNTER — Ambulatory Visit (INDEPENDENT_AMBULATORY_CARE_PROVIDER_SITE_OTHER): Admitting: Family Medicine

## 2012-02-26 ENCOUNTER — Encounter: Payer: Self-pay | Admitting: Family Medicine

## 2012-02-26 VITALS — BP 126/80 | HR 67 | Temp 98.1°F | Wt 181.2 lb

## 2012-02-26 DIAGNOSIS — E119 Type 2 diabetes mellitus without complications: Secondary | ICD-10-CM

## 2012-02-26 DIAGNOSIS — E785 Hyperlipidemia, unspecified: Secondary | ICD-10-CM | POA: Insufficient documentation

## 2012-02-26 DIAGNOSIS — I10 Essential (primary) hypertension: Secondary | ICD-10-CM

## 2012-02-26 LAB — HEPATIC FUNCTION PANEL
ALT: 18 U/L (ref 0–35)
Albumin: 4.1 g/dL (ref 3.5–5.2)
Total Bilirubin: 1.1 mg/dL (ref 0.3–1.2)
Total Protein: 7 g/dL (ref 6.0–8.3)

## 2012-02-26 LAB — POCT URINALYSIS DIPSTICK
Bilirubin, UA: NEGATIVE
Glucose, UA: NEGATIVE
Ketones, UA: NEGATIVE
Leukocytes, UA: NEGATIVE
Nitrite, UA: NEGATIVE

## 2012-02-26 LAB — BASIC METABOLIC PANEL
Calcium: 9.1 mg/dL (ref 8.4–10.5)
Creatinine, Ser: 0.8 mg/dL (ref 0.4–1.2)

## 2012-02-26 LAB — LIPID PANEL
Cholesterol: 258 mg/dL — ABNORMAL HIGH (ref 0–200)
Total CHOL/HDL Ratio: 3
Triglycerides: 47 mg/dL (ref 0.0–149.0)

## 2012-02-26 LAB — MICROALBUMIN / CREATININE URINE RATIO: Microalb Creat Ratio: 0.4 mg/g (ref 0.0–30.0)

## 2012-02-26 LAB — LDL CHOLESTEROL, DIRECT: Direct LDL: 166.3 mg/dL

## 2012-02-26 MED ORDER — VALSARTAN 80 MG PO TABS: 80.0000 mg | ORAL_TABLET | Freq: Every day | ORAL | Status: DC

## 2012-02-26 NOTE — Assessment & Plan Note (Signed)
Stable con't meds 

## 2012-02-26 NOTE — Assessment & Plan Note (Signed)
Check labs con't meds 

## 2012-02-26 NOTE — Progress Notes (Signed)
  Subjective:    Patient ID: Adriana Collins, female    DOB: 03/10/1951, 61 y.o.   MRN: [id number]  HPI Pt here for f/u-- no complaints.  HYPERTENSION Disease Monitoring Blood pressure range-good Chest pain- no      Dyspnea- no Medications Compliance- goof Lightheadedness- no   Edema- no   DIABETES Disease Monitoring Blood Sugar ranges-80-120 Polyuria- no New Visual problems- no Medications Compliance- good Hypoglycemic symptoms- no   HYPERLIPIDEMIA Disease Monitoring See symptoms for Hypertension Medications Compliance- poor--secondary to side effects RUQ pain- no  Muscle aches- not now--off med  ROS See HPI above   PMH Smoking Status noted      Review of Systems    as above Objective:   Physical Exam  Constitutional: She is oriented to person, place, and time. She appears well-developed and well-nourished.  Cardiovascular: Normal rate, regular rhythm and normal heart sounds.   No murmur heard. Pulmonary/Chest: Effort normal and breath sounds normal. No respiratory distress. She has no wheezes. She has no rales. She exhibits no tenderness.  Musculoskeletal: Normal range of motion. She exhibits no edema and no tenderness.       Sensory exam of the foot is normal, tested with the monofilament. Good pulses, no lesions or ulcers, good peripheral pulses.   Neurological: She is alert and oriented to person, place, and time.  Psychiatric: She has a normal mood and affect. Her behavior is normal. Judgment and thought content normal.          Assessment & Plan:

## 2012-02-26 NOTE — Assessment & Plan Note (Signed)
Pt is off meds secondary to side effects Check labs today

## 2012-02-26 NOTE — Patient Instructions (Signed)

## 2012-04-21 ENCOUNTER — Other Ambulatory Visit: Payer: Self-pay | Admitting: Family Medicine

## 2012-07-12 ENCOUNTER — Encounter: Payer: Self-pay | Admitting: Family Medicine

## 2012-07-12 ENCOUNTER — Ambulatory Visit (INDEPENDENT_AMBULATORY_CARE_PROVIDER_SITE_OTHER): Admitting: Family Medicine

## 2012-07-12 VITALS — BP 134/82 | HR 76 | Temp 99.6°F | Wt 184.2 lb

## 2012-07-12 DIAGNOSIS — J40 Bronchitis, not specified as acute or chronic: Secondary | ICD-10-CM

## 2012-07-12 MED ORDER — GUAIFENESIN-CODEINE 100-10 MG/5ML PO SYRP: ORAL_SOLUTION | ORAL | Status: DC

## 2012-07-12 MED ORDER — AZITHROMYCIN 250 MG PO TABS: ORAL_TABLET | ORAL | Status: DC

## 2012-07-12 NOTE — Patient Instructions (Signed)

## 2012-07-12 NOTE — Progress Notes (Signed)
  Subjective:     Adriana Collins is a 61 y.o. female here for evaluation of a cough. Onset of symptoms was 2 days ago. Symptoms have been gradually worsening since that time. The cough is barky and dry and is aggravated by reclining position. Associated symptoms include: postnasal drip. Patient does not have a history of asthma. Patient does not have a history of environmental allergens. Patient has not traveled recently. Patient does not have a history of smoking. Patient has not had a previous chest x-ray. Patient has not had a PPD done.  The following portions of the patient's history were reviewed and updated as appropriate: allergies, current medications, past family history, past medical history, past social history, past surgical history and problem list.  Review of Systems Pertinent items are noted in HPI.    Objective:    Oxygen saturation 98% on room air BP 134/82  Pulse 76  Temp 99.6 F (37.6 C) (Oral)  Wt 184 lb 3.2 oz (83.553 kg)  SpO2 98% General appearance: alert, cooperative, appears stated age and no distress Ears: normal TM's and external ear canals both ears Nose: Nares normal. Septum midline. Mucosa normal. No drainage or sinus tenderness. Throat: abnormal findings: mild oropharyngeal erythema and pnd Neck: no adenopathy, supple, symmetrical, trachea midline and thyroid not enlarged, symmetric, no tenderness/mass/nodules Lungs: clear to auscultation bilaterally Heart: S1, S2 normal    Assessment:    URI with Post Nasal Drip    Plan:    Explained lack of efficacy of antibiotics in viral disease. Antitussives per medication orders. Avoid exposure to tobacco smoke and fumes. Call if shortness of breath worsens, blood in sputum, change in character of cough, development of fever or chills, inability to maintain nutrition and hydration. Avoid exposure to tobacco smoke and fumes. Trial of antihistamines. mucinex

## 2012-08-05 ENCOUNTER — Other Ambulatory Visit: Payer: Self-pay | Admitting: Family Medicine

## 2012-08-09 ENCOUNTER — Ambulatory Visit (INDEPENDENT_AMBULATORY_CARE_PROVIDER_SITE_OTHER): Admitting: Family Medicine

## 2012-08-09 ENCOUNTER — Encounter: Payer: Self-pay | Admitting: Family Medicine

## 2012-08-09 VITALS — BP 116/74 | HR 66 | Temp 98.7°F | Wt 184.2 lb

## 2012-08-09 DIAGNOSIS — R319 Hematuria, unspecified: Secondary | ICD-10-CM

## 2012-08-09 DIAGNOSIS — N898 Other specified noninflammatory disorders of vagina: Secondary | ICD-10-CM | POA: Insufficient documentation

## 2012-08-09 DIAGNOSIS — N841 Polyp of cervix uteri: Secondary | ICD-10-CM

## 2012-08-09 DIAGNOSIS — L293 Anogenital pruritus, unspecified: Secondary | ICD-10-CM

## 2012-08-09 LAB — POCT URINALYSIS DIPSTICK
Glucose, UA: NEGATIVE
Nitrite, UA: NEGATIVE
Protein, UA: NEGATIVE
Urobilinogen, UA: 0.2

## 2012-08-09 NOTE — Assessment & Plan Note (Signed)
Resolved but since pt not sure what her husband was tested for and dx with we will send wet prep and gc/chlamydia rto prn

## 2012-08-09 NOTE — Assessment & Plan Note (Signed)
Refer to Gyn Pt preference Polyp not removed in office today

## 2012-08-09 NOTE — Progress Notes (Signed)
  Subjective:    Patient ID: Adriana Collins, female    DOB: 26-Feb-1951, 62 y.o.   MRN: [id number]  HPI Pt here c/o vaginal itching x few days that has now resolved.  Her husband was dx with something but she does not know what. No d/c , no odor.   Review of Systems As above    Objective:   Physical Exam  BP 116/74  Pulse 66  Temp 98.7 F (37.1 C) (Oral)  Wt 184 lb 3.2 oz (83.553 kg)  SpO2 97% General appearance: alert, cooperative, appears stated age and no distress Abdomen: soft, non-tender; bowel sounds normal; no masses,  no organomegaly Pelvic: cervix normal in appearance, external genitalia normal, no adnexal masses or tenderness, no cervical motion tenderness, positive findings: cervical polyp, rectovaginal septum normal, uterus normal size, shape, and consistency and vagina normal without discharge       Assessment & Plan:

## 2012-08-10 LAB — WET PREP BY MOLECULAR PROBE: Gardnerella vaginalis: NEGATIVE

## 2012-08-10 LAB — GC/CHLAMYDIA PROBE AMP, URINE: Chlamydia, Swab/Urine, PCR: NEGATIVE

## 2012-08-11 LAB — URINE CULTURE

## 2012-09-05 ENCOUNTER — Other Ambulatory Visit: Payer: Self-pay | Admitting: Family Medicine

## 2012-09-10 ENCOUNTER — Other Ambulatory Visit: Payer: Self-pay | Admitting: Family Medicine

## 2012-09-10 ENCOUNTER — Other Ambulatory Visit: Payer: Self-pay

## 2012-10-21 ENCOUNTER — Other Ambulatory Visit: Payer: Self-pay | Admitting: Family Medicine

## 2012-10-25 ENCOUNTER — Ambulatory Visit (INDEPENDENT_AMBULATORY_CARE_PROVIDER_SITE_OTHER): Admitting: Family Medicine

## 2012-10-25 ENCOUNTER — Other Ambulatory Visit

## 2012-10-25 ENCOUNTER — Encounter: Payer: Self-pay | Admitting: Family Medicine

## 2012-10-25 VITALS — BP 118/72 | HR 66 | Temp 98.6°F | Wt 182.8 lb

## 2012-10-25 DIAGNOSIS — E119 Type 2 diabetes mellitus without complications: Secondary | ICD-10-CM

## 2012-10-25 DIAGNOSIS — I10 Essential (primary) hypertension: Secondary | ICD-10-CM

## 2012-10-25 DIAGNOSIS — E785 Hyperlipidemia, unspecified: Secondary | ICD-10-CM

## 2012-10-25 LAB — POCT URINALYSIS DIPSTICK
Blood, UA: NEGATIVE
Glucose, UA: NEGATIVE
Nitrite, UA: NEGATIVE
Protein, UA: NEGATIVE
Urobilinogen, UA: 0.2

## 2012-10-25 LAB — CBC WITH DIFFERENTIAL/PLATELET
Eosinophils Relative: 2.3 % (ref 0.0–5.0)
HCT: 37.1 % (ref 36.0–46.0)
Hemoglobin: 12.7 g/dL (ref 12.0–15.0)
Lymphs Abs: 1.8 10*3/uL (ref 0.7–4.0)
Monocytes Relative: 9.8 % (ref 3.0–12.0)
Neutro Abs: 1.6 10*3/uL (ref 1.4–7.7)
WBC: 3.9 10*3/uL — ABNORMAL LOW (ref 4.5–10.5)

## 2012-10-25 LAB — BASIC METABOLIC PANEL
GFR: 90.08 mL/min (ref >60.00)
Potassium: 3.3 mEq/L — ABNORMAL LOW (ref 3.5–5.1)
Sodium: 135 mEq/L (ref 135–145)

## 2012-10-25 LAB — HEPATIC FUNCTION PANEL
AST: 21 U/L (ref 0–37)
Albumin: 4 g/dL (ref 3.5–5.2)
Alkaline Phosphatase: 52 U/L (ref 39–117)
Total Protein: 6.8 g/dL (ref 6.0–8.3)

## 2012-10-25 LAB — HEMOGLOBIN A1C: Hgb A1c MFr Bld: 5.7 % (ref 4.6–6.5)

## 2012-10-25 LAB — MICROALBUMIN / CREATININE URINE RATIO
Creatinine,U: 142.2 mg/dL
Microalb Creat Ratio: 0.6 mg/g (ref 0.0–30.0)
Microalb, Ur: 0.8 mg/dL (ref 0.0–1.9)

## 2012-10-25 LAB — LDL CHOLESTEROL, DIRECT: Direct LDL: 195.2 mg/dL

## 2012-10-25 MED ORDER — HYDROCHLOROTHIAZIDE 25 MG PO TABS: 25.0000 mg | ORAL_TABLET | Freq: Every day | ORAL | Status: DC

## 2012-10-25 NOTE — Assessment & Plan Note (Signed)
Check labs con't meds 

## 2012-10-25 NOTE — Assessment & Plan Note (Signed)
Stable con't meds 

## 2012-10-25 NOTE — Assessment & Plan Note (Signed)
Check labs Pt does not tolerated statins

## 2012-10-25 NOTE — Patient Instructions (Signed)
Diabetes and Standards of Medical Care  Diabetes is complicated. You may find that your diabetes team includes a dietitian, nurse, diabetes educator, eye doctor, and more. To help everyone know what is going on and to help you get the care you deserve, the following schedule of care was developed to help keep you on track. Below are the tests, exams, vaccines, medicines, education, and plans you will need. A1c test  Performed at least 2 times a year if you are meeting treatment goals.  Performed 4 times a year if therapy has changed or if you are not meeting therapy/glycemic goals. Aspirin medicine  Take daily as directed by your caregiver. Blood pressure test  Performed at every routine medical visit. The goal is less than 130/80 mm/Hg. Dental exam  Get a dental exam at least 2 times a year. Dilated eye exam (retinal exam)  Type 1 diabetes: Get an exam within 5 years of diagnosis and then yearly.  Type 2 diabetes: Get an exam at diagnosis and then yearly. All exams thereafter can be extended to every 2 to 3 years if one or more exams have been normal. Foot care exam  Visual foot exams are performed at every routine medical visit. The exams check for cuts, injuries, or other problems with the feet.  A comprehensive foot exam should be done yearly. This includes visual inspection as well as assessing foot pulses and testing for loss of sensation. Kidney function test (urine microalbumin)  Performed once a year.  Type 1 diabetes: The first test is performed 5 years after diagnosis.  Type 2 diabetes: The first test is performed at the time of diagnosis.  A serum creatinine and estimated glomerular filtration rate (eGFR) test is done once a year to tell the level of chronic kidney disease (CKD), if present. Lipid profile (Cholesterol, HDL, LDL, Triglycerides)  Performed once a year for most people. If at low risk, may be assessed every 2 years.  The goal for LDL is less than 100  mg/dl. If at high risk, the goal is less than 70 mg/dl.  The goal for HDL is higher than 40 mg/dl for men and higher than 50 mg/dl for women.  The goal for triglycerides is less than 150 mg/dl. Flu vaccine, pneumonia vaccine, and hepatitis B vaccine  The flu vaccine is recommended yearly.  The pneumonia vaccine is generally given once in a lifetime. However, there are some instances where another vaccine is recommended. Check with your caregiver.  The hepatitis B vaccine is also recommended for adults with diabetes. Diabetes self-management education  Recommended at diagnosis and ongoing as needed. Treatment plan  Reviewed at every medical visit. Document Released: 05/10/2009 Document Revised: 10/05/2011 Document Reviewed: 01/13/2011 Exit[employer] Patient Information 2013 [employer].  

## 2012-10-25 NOTE — Progress Notes (Signed)
  Subjective:    Patient ID: Adriana Collins, female    DOB: 12-19-1950, 62 y.o.   MRN: [id number]  HPI HYPERTENSION Disease Monitoring Blood pressure range-good Chest pain- no      Dyspnea- no Medications Compliance- good Lightheadedness- no   Edema- no   DIABETES Disease Monitoring Blood Sugar ranges-under 100 Polyuria- no New Visual problems- no Medications Compliance- good Hypoglycemic symptoms- no   HYPERLIPIDEMIA Disease Monitoring See symptoms for Hypertension Medications Compliance- poor RUQ pain- no  Muscle aches- yes with statins  ROS See HPI above   PMH Smoking Status noted     Review of Systems As above    Objective:   Physical Exam  BP 118/72  Pulse 66  Temp(Src) 98.6 F (37 C) (Oral)  Wt 182 lb 12.8 oz (82.918 kg)  BMI 30.42 kg/m2  SpO2 96% General appearance: alert, cooperative, appears stated age and no distress Neck: no adenopathy, supple, symmetrical, trachea midline and thyroid not enlarged, symmetric, no tenderness/mass/nodules Lungs: clear to auscultation bilaterally Heart: S1, S2 normal Extremities: extremities normal, atraumatic, no cyanosis or edema Sensory exam of the foot is normal, tested with the monofilament. Good pulses, no lesions or ulcers, good peripheral pulses.       Assessment & Plan:

## 2012-11-03 ENCOUNTER — Other Ambulatory Visit: Payer: Self-pay

## 2012-11-03 MED ORDER — ATORVASTATIN CALCIUM 20 MG PO TABS: 20.0000 mg | ORAL_TABLET | Freq: Every day | ORAL | Status: DC

## 2012-11-03 MED ORDER — POTASSIUM CHLORIDE CRYS ER 20 MEQ PO TBCR: 20.0000 meq | EXTENDED_RELEASE_TABLET | Freq: Every day | ORAL | Status: DC

## 2012-11-24 ENCOUNTER — Encounter: Payer: Self-pay | Admitting: Family Medicine

## 2012-11-24 ENCOUNTER — Ambulatory Visit (INDEPENDENT_AMBULATORY_CARE_PROVIDER_SITE_OTHER): Admitting: Family Medicine

## 2012-11-24 ENCOUNTER — Other Ambulatory Visit: Payer: Self-pay | Admitting: Family Medicine

## 2012-11-24 VITALS — BP 118/70 | HR 73 | Temp 98.6°F | Wt 185.4 lb

## 2012-11-24 DIAGNOSIS — W57XXXA Bitten or stung by nonvenomous insect and other nonvenomous arthropods, initial encounter: Secondary | ICD-10-CM

## 2012-11-24 MED ORDER — PREDNISONE 10 MG PO TABS: ORAL_TABLET | ORAL | Status: DC

## 2012-11-24 NOTE — Patient Instructions (Signed)
Insect Bite Mosquitoes, flies, fleas, bedbugs, and many other insects can bite. Insect bites are different from insect stings. A sting is when venom is injected into the skin. Some insect bites can transmit infectious diseases. SYMPTOMS  Insect bites usually turn red, swell, and itch for 2 to 4 days. They often go away on their own. TREATMENT  Your caregiver may prescribe antibiotic medicines if a bacterial infection develops in the bite. HOME CARE INSTRUCTIONS  Do not scratch the bite area.  Keep the bite area clean and dry. Wash the bite area thoroughly with soap and water.  Put ice or cool compresses on the bite area.  Put ice in a plastic bag.  Place a towel between your skin and the bag.  Leave the ice on for 20 minutes, 4 times a day for the first 2 to 3 days, or as directed.  You may apply a baking soda paste, cortisone cream, or calamine lotion to the bite area as directed by your caregiver. This can help reduce itching and swelling.  Only take over-the-counter or prescription medicines as directed by your caregiver.  If you are given antibiotics, take them as directed. Finish them even if you start to feel better. You may need a tetanus shot if:  You cannot remember when you had your last tetanus shot.  You have never had a tetanus shot.  The injury broke your skin. If you get a tetanus shot, your arm may swell, get red, and feel warm to the touch. This is common and not a problem. If you need a tetanus shot and you choose not to have one, there is a rare chance of getting tetanus. Sickness from tetanus can be serious. SEEK IMMEDIATE MEDICAL CARE IF:   You have increased pain, redness, or swelling in the bite area.  You see a red line on the skin coming from the bite.  You have a fever.  You have joint pain.  You have a headache or neck pain.  You have unusual weakness.  You have a rash.  You have chest pain or shortness of breath.  You have abdominal pain,  nausea, or vomiting.  You feel unusually tired or sleepy. MAKE SURE YOU:   Understand these instructions.  Will watch your condition.  Will get help right away if you are not doing well or get worse. Document Released: 08/20/2004 Document Revised: 10/05/2011 Document Reviewed: 02/11/2011 [employer] Patient Information 2013 [employer].  

## 2012-11-24 NOTE — Progress Notes (Signed)
  Subjective:    Patient ID: Adriana Collins, female    DOB: April 09, 1951, 62 y.o.   MRN: [id number]  HPI  Pt here c/o insect bites on back and forearm.  It is very itchy and she can not reach her back to put lotion on.  Review of Systems As above    Objective:   Physical Exam  BP 118/70  Pulse 73  Temp(Src) 98.6 F (37 C) (Oral)  Wt 185 lb 6.4 oz (84.097 kg)  BMI 30.85 kg/m2  SpO2 97% General appearance: alert, cooperative, appears stated age and no distress Skin: + area of errythma on L forearm and mid back  warm to touch      Assessment & Plan:  Insect bites---- pred taper                 Pt was given insulin sliding scale                 Benadryl for itching                 Call or rto prn

## 2012-11-25 ENCOUNTER — Emergency Department (HOSPITAL_BASED_OUTPATIENT_CLINIC_OR_DEPARTMENT_OTHER)
Admission: EM | Admit: 2012-11-25 | Discharge: 2012-11-25 | Disposition: A | Discharge status: Home / Self Care | Attending: Emergency Medicine | Admitting: Emergency Medicine

## 2012-11-25 ENCOUNTER — Encounter (HOSPITAL_BASED_OUTPATIENT_CLINIC_OR_DEPARTMENT_OTHER): Payer: Self-pay | Admitting: Emergency Medicine

## 2012-11-25 DIAGNOSIS — Z79899 Other long term (current) drug therapy: Secondary | ICD-10-CM | POA: Insufficient documentation

## 2012-11-25 DIAGNOSIS — IMO0001 Reserved for inherently not codable concepts without codable children: Secondary | ICD-10-CM | POA: Insufficient documentation

## 2012-11-25 DIAGNOSIS — Y929 Unspecified place or not applicable: Secondary | ICD-10-CM | POA: Insufficient documentation

## 2012-11-25 DIAGNOSIS — I10 Essential (primary) hypertension: Secondary | ICD-10-CM | POA: Insufficient documentation

## 2012-11-25 DIAGNOSIS — E785 Hyperlipidemia, unspecified: Secondary | ICD-10-CM | POA: Insufficient documentation

## 2012-11-25 DIAGNOSIS — Z7982 Long term (current) use of aspirin: Secondary | ICD-10-CM | POA: Insufficient documentation

## 2012-11-25 DIAGNOSIS — W57XXXA Bitten or stung by nonvenomous insect and other nonvenomous arthropods, initial encounter: Secondary | ICD-10-CM | POA: Insufficient documentation

## 2012-11-25 DIAGNOSIS — S40269A Insect bite (nonvenomous) of unspecified shoulder, initial encounter: Secondary | ICD-10-CM | POA: Insufficient documentation

## 2012-11-25 DIAGNOSIS — Y939 Activity, unspecified: Secondary | ICD-10-CM | POA: Insufficient documentation

## 2012-11-25 DIAGNOSIS — E119 Type 2 diabetes mellitus without complications: Secondary | ICD-10-CM | POA: Insufficient documentation

## 2012-11-25 HISTORY — DX: Hyperlipidemia, unspecified: E78.5

## 2012-11-25 MED ORDER — TRIAMCINOLONE ACETONIDE 0.1 % EX CREA: TOPICAL_CREAM | Freq: Two times a day (BID) | CUTANEOUS | Status: DC

## 2012-11-25 MED ORDER — DOXYCYCLINE HYCLATE 100 MG PO CAPS: 100.0000 mg | ORAL_CAPSULE | Freq: Two times a day (BID) | ORAL | Status: DC

## 2012-11-25 MED ORDER — HYDROXYZINE HCL 25 MG PO TABS: 25.0000 mg | ORAL_TABLET | Freq: Four times a day (QID) | ORAL | Status: DC

## 2012-11-25 MED ORDER — CEPHALEXIN 500 MG PO CAPS: 500.0000 mg | ORAL_CAPSULE | Freq: Four times a day (QID) | ORAL | Status: DC

## 2012-11-25 NOTE — ED Provider Notes (Signed)
History     CSN: [id number]  Arrival date & time 11/25/12  1928   First MD Initiated Contact with Patient 11/25/12 1943      Chief Complaint  Patient presents with  . Insect Bite    (Consider location/radiation/quality/duration/timing/severity/associated sxs/prior treatment) Patient is a 62 y.o. female presenting with rash.  Rash Location:  Torso and shoulder/arm Shoulder/arm rash location:  R forearm Torso rash location:  L chest Quality: itchiness, redness and swelling   Severity:  Moderate Onset quality:  Gradual Duration:  36 hours Timing:  Intermittent Chronicity:  New Context: insect bite/sting   Ineffective treatments:  Anti-itch cream, antibiotic cream and antihistamines Associated symptoms: induration   Associated symptoms: no fever, no shortness of breath, no throat swelling, no tongue swelling and not wheezing     Past Medical History  Diagnosis Date  . Hypertension   . Diabetes mellitus   . Hyperlipemia     Past Surgical History  Procedure Laterality Date  . Ovarian cyst surgery  1980  . Colonoscopy    . Polypectomy    . Cesarean section    . Breast lumpectomy      left    Family History  Problem Relation Age of Onset  . Coronary artery disease    . Prostate cancer    . Diabetes Mother   . Heart disease Mother 73    MI  . Cancer Father 47    prostate  . Diabetes Maternal Grandmother   . Cancer Sister 81    colon CA  . Diabetes Brother   . Colon cancer Neg Hx     History  Substance Use Topics  . Smoking status: Never Smoker   . Smokeless tobacco: Never Used  . Alcohol Use: 0.5 oz/week    1 drink(s) per week    OB History   Grav Para Term Preterm Abortions TAB SAB Ect Mult Living                  Review of Systems  Constitutional: Negative for fever.  Respiratory: Negative for shortness of breath and wheezing.   Skin: Positive for rash.  All other systems reviewed and are negative.    Allergies  Sulfonamide  derivatives  Home Medications   Current Outpatient Rx  Name  Route  Sig  Dispense  Refill  . Ascorbic Acid (VITAMIN C) 100 MG tablet   Oral   Take 1,000 mg by mouth daily. Take 1000mg  daily, then increases to 3000mg  daily if feeling illness         . aspirin 81 MG chewable tablet   Oral   Chew 81 mg by mouth daily.           Marland Kitchen atorvastatin (LIPITOR) 20 MG tablet   Oral   Take 1 tablet (20 mg total) by mouth daily.   30 tablet   2   . CALCIUM/MAGNESIUM/ZINC FORMULA PO   Oral   Take 3,000 mg by mouth daily.         . cephALEXin (KEFLEX) 500 MG capsule   Oral   Take 1 capsule (500 mg total) by mouth 4 (four) times daily.   28 capsule   0   . DIOVAN 80 MG tablet      TAKE 1 TABLET (80 MG TOTAL) BY MOUTH DAILY.   30 tablet   5     CYCLE FILL MEDICATION. Authorization is required f ...   . fish oil-omega-3 fatty acids 1000 MG capsule  Oral   Take 1 g by mouth daily.         . Ginkgo Biloba 40 MG TABS   Oral   Take by mouth.           Marland Kitchen glucose blood (FREESTYLE LITE) test strip   Other   1 each by Other route as needed. Use as instructed          . hydrochlorothiazide (HYDRODIURIL) 25 MG tablet   Oral   Take 1 tablet (25 mg total) by mouth daily.   100 tablet   1     CYCLE FILL MEDICATION.   . hydrOXYzine (ATARAX/VISTARIL) 25 MG tablet   Oral   Take 1 tablet (25 mg total) by mouth every 6 (six) hours.   12 tablet   0   . Lancets (FREESTYLE) lancets   Other   1 each by Other route as needed. Use as instructed          . MULTIPLE VITAMIN PO   Oral   Take by mouth.           . potassium chloride SA (K-DUR,KLOR-CON) 20 MEQ tablet   Oral   Take 1 tablet (20 mEq total) by mouth daily.   30 tablet   2   . predniSONE (DELTASONE) 10 MG tablet      3 po qd for 3 days then 2 po qd for 3 days the 1 po qd for 3 days   18 tablet   0   . sitaGLIPtan-metformin (JANUMET) 50-1000 MG per tablet   Oral   Take 1 tablet by mouth 2 (two) times  daily with a meal.   60 tablet   2     CYCLE FILL MEDICATION. Authorization is required f ...   . triamcinolone cream (KENALOG) 0.1 %   Topical   Apply topically 2 (two) times daily.   30 g   0     BP 133/78  Pulse 74  Temp(Src) 98.9 F (37.2 C) (Oral)  Resp 12  Ht 5\' 5"  (1.651 m)  Wt 185 lb (83.915 kg)  BMI 30.79 kg/m2  SpO2 98%  Physical Exam  Nursing note and vitals reviewed. Constitutional: She is oriented to person, place, and time. She appears well-developed and well-nourished.  HENT:  Head: Normocephalic.  Eyes: Pupils are equal, round, and reactive to light.  Neck: Normal range of motion.  Cardiovascular: Normal rate and regular rhythm.   Pulmonary/Chest: Effort normal and breath sounds normal.  Abdominal: Soft. Bowel sounds are normal.  Musculoskeletal: Normal range of motion. She exhibits no edema and no tenderness.  Lymphadenopathy:    She has no cervical adenopathy.  Neurological: She is alert and oriented to person, place, and time.  Skin: Skin is warm and dry. Rash noted. There is erythema.     Psychiatric: She has a normal mood and affect. Her behavior is normal. Judgment and thought content normal.    ED Course  Procedures (including critical care time)  Labs Reviewed - No data to display No results found.   1. Insect bite    Forearm bite with concern for developing cellulitis.  Will start keflex.  Patient to follow-up with her PCP.   MDM          Jimmye Norman, NP 11/25/12 2008

## 2012-11-25 NOTE — ED Notes (Signed)
C/o "bites" to left forearm and back.  States areas itch.  She took Benadryl this am with some relief.  No SHOB.

## 2012-11-25 NOTE — ED Notes (Signed)
Pt reports a "bite" starting Thursday morning, on her right forearm and left shoulder, felt something bite her arm at about 4:30 am, then when she got up she noticed a couple of bites on her back as well. Sister marked around it about a half hour ago. Has been putting apple cider vinegar, neosporin, alcohol, and lanacaine to try and help but it is continuing to spread.

## 2012-11-28 NOTE — ED Provider Notes (Signed)
Medical screening examination/treatment/procedure(s) were conducted as a shared visit with non-physician practitioner(s) and myself.  I personally evaluated the patient during the encounter  Patient with local reaction or early cellulitis and risk for MRSA, recommend treating with doxy instead of keflex. Non toxic NAD.    Shelda Jakes, MD 11/28/12 [phone]

## 2013-01-26 ENCOUNTER — Other Ambulatory Visit: Payer: Self-pay

## 2013-01-26 ENCOUNTER — Telehealth: Payer: Self-pay | Admitting: Family Medicine

## 2013-01-26 ENCOUNTER — Emergency Department (HOSPITAL_BASED_OUTPATIENT_CLINIC_OR_DEPARTMENT_OTHER)

## 2013-01-26 ENCOUNTER — Encounter (HOSPITAL_BASED_OUTPATIENT_CLINIC_OR_DEPARTMENT_OTHER): Payer: Self-pay | Admitting: *Deleted

## 2013-01-26 ENCOUNTER — Emergency Department (HOSPITAL_BASED_OUTPATIENT_CLINIC_OR_DEPARTMENT_OTHER)
Admission: EM | Admit: 2013-01-26 | Discharge: 2013-01-27 | Disposition: A | Discharge status: Home / Self Care | Attending: Emergency Medicine | Admitting: Emergency Medicine

## 2013-01-26 DIAGNOSIS — I10 Essential (primary) hypertension: Secondary | ICD-10-CM | POA: Insufficient documentation

## 2013-01-26 DIAGNOSIS — E119 Type 2 diabetes mellitus without complications: Secondary | ICD-10-CM | POA: Insufficient documentation

## 2013-01-26 DIAGNOSIS — Z79899 Other long term (current) drug therapy: Secondary | ICD-10-CM | POA: Insufficient documentation

## 2013-01-26 DIAGNOSIS — R0601 Orthopnea: Secondary | ICD-10-CM | POA: Insufficient documentation

## 2013-01-26 DIAGNOSIS — Z7982 Long term (current) use of aspirin: Secondary | ICD-10-CM | POA: Insufficient documentation

## 2013-01-26 DIAGNOSIS — I491 Atrial premature depolarization: Secondary | ICD-10-CM | POA: Insufficient documentation

## 2013-01-26 DIAGNOSIS — E785 Hyperlipidemia, unspecified: Secondary | ICD-10-CM | POA: Insufficient documentation

## 2013-01-26 LAB — BASIC METABOLIC PANEL
BUN: 16 mg/dL (ref 6–23)
CO2: 23 mEq/L (ref 19–32)
Calcium: 10.2 mg/dL (ref 8.4–10.5)
Chloride: 98 mEq/L (ref 96–112)
Creatinine, Ser: 0.8 mg/dL (ref 0.50–1.10)

## 2013-01-26 LAB — URINALYSIS, ROUTINE W REFLEX MICROSCOPIC
Bilirubin Urine: NEGATIVE
Glucose, UA: NEGATIVE mg/dL
Hgb urine dipstick: NEGATIVE
Ketones, ur: NEGATIVE mg/dL
Nitrite: NEGATIVE
Specific Gravity, Urine: 1.012 (ref 1.005–1.030)
pH: 5.5 (ref 5.0–8.0)

## 2013-01-26 LAB — CBC WITH DIFFERENTIAL/PLATELET
Basophils Absolute: 0 10*3/uL (ref 0.0–0.1)
Basophils Relative: 1 % (ref 0–1)
Eosinophils Relative: 5 % (ref 0–5)
HCT: 39.7 % (ref 36.0–46.0)
Hemoglobin: 13.8 g/dL (ref 12.0–15.0)
Lymphocytes Relative: 45 % (ref 12–46)
MCHC: 34.8 g/dL (ref 30.0–36.0)
MCV: 86.7 fL (ref 78.0–100.0)
Monocytes Absolute: 0.5 10*3/uL (ref 0.1–1.0)
Monocytes Relative: 9 % (ref 3–12)
RDW: 13.7 % (ref 11.5–15.5)

## 2013-01-26 LAB — TROPONIN I: Troponin I: <0.3 ng/mL (ref <0.30)

## 2013-01-26 NOTE — Telephone Encounter (Signed)
Patient Information:  Caller Name: Shanyah  Phone: [phone]  Patient: Adriana Collins  Gender: Female  DOB: 05-Mar-1951  Age: 62 Years  PCP: Lelon Perla.  Office Follow Up:  Does the office need to follow up with this patient?: Yes  Instructions For The Office: Appts not available within dispositioned time frame krs/can  RN Note:  States has noticed heart rate changes within past few days.  States she is also having hot flashes.  Per heart rate/heartbeat protocol, disposition See in Office Today; no appts available in office within dispositioned time frame.  Info to office via Epic/for appt management/workin/callback.  May reach patient at [phone].  krs/can  Symptoms  Reason For Call & Symptoms: palpitations  Reviewed Health History In EMR: Yes  Reviewed Medications In EMR: Yes  Reviewed Allergies In EMR: Yes  Reviewed Surgeries / Procedures: Yes  Date of Onset of Symptoms: 01/23/2013  Guideline(s) Used:  Heart Rate and Heartbeat Questions  Disposition Per Guideline:   See Today in Office  Reason For Disposition Reached:   Skipped or extra beat(s) and occurs 4 or more times per minute  Advice Given:  N/A  Patient Will Follow Care Advice:  YES

## 2013-01-26 NOTE — ED Notes (Signed)
Heart palpitations for several days.

## 2013-01-26 NOTE — Telephone Encounter (Signed)
Late entry from this morning: Spoke with patient she c/o chest pain, palpitations and Sob x's 3 days. I have advised her to go to the ED for Evaluation and she stated she would do so.     KP

## 2013-01-26 NOTE — ED Notes (Signed)
Patient transported to X-ray 

## 2013-01-26 NOTE — ED Provider Notes (Signed)
History    CSN: [id number] Arrival date & time 01/26/13  2225  First MD Initiated Contact with Patient 01/26/13 2301     Chief Complaint  Patient presents with  . Palpitations   (Consider location/radiation/quality/duration/timing/severity/associated sxs/prior Treatment) Patient is a 62 y.o. female presenting with palpitations. The history is provided by the patient.  Palpitations Palpitations quality:  Irregular Onset quality:  Gradual Timing:  Intermittent Progression:  Unchanged Chronicity:  New Context: not anxiety, not appetite suppressants, not bronchodilators, not caffeine, not exercise, not hyperventilation, not illicit drugs, not nicotine and not stimulant use   Relieved by:  Nothing Worsened by:  Nothing tried Ineffective treatments:  None tried Associated symptoms: orthopnea   Associated symptoms: no chest pain, no chest pressure, no diaphoresis, no dizziness, no hemoptysis, no leg pain, no lower extremity edema, no malaise/fatigue, no nausea, no shortness of breath, no syncope, no vomiting and no weakness   Risk factors: no hyperthyroidism    Past Medical History  Diagnosis Date  . Hypertension   . Diabetes mellitus   . Hyperlipemia    Past Surgical History  Procedure Laterality Date  . Ovarian cyst surgery  1980  . Colonoscopy    . Polypectomy    . Cesarean section    . Breast lumpectomy      left   Family History  Problem Relation Age of Onset  . Coronary artery disease    . Prostate cancer    . Diabetes Mother   . Heart disease Mother 49    MI  . Cancer Father 30    prostate  . Diabetes Maternal Grandmother   . Cancer Sister 31    colon CA  . Diabetes Brother   . Colon cancer Neg Hx    History  Substance Use Topics  . Smoking status: Never Smoker   . Smokeless tobacco: Never Used  . Alcohol Use: 0.5 oz/week    1 drink(s) per week   OB History   Grav Para Term Preterm Abortions TAB SAB Ect Mult Living                 Review of  Systems  Constitutional: Negative for malaise/fatigue and diaphoresis.  HENT: Negative for neck pain and neck stiffness.   Respiratory: Negative for hemoptysis and shortness of breath.   Cardiovascular: Positive for palpitations and orthopnea. Negative for chest pain and syncope.  Gastrointestinal: Negative for nausea and vomiting.  Neurological: Negative for dizziness.  All other systems reviewed and are negative.    Allergies  Sulfonamide derivatives  Home Medications   Current Outpatient Rx  Name  Route  Sig  Dispense  Refill  . Ascorbic Acid (VITAMIN C) 100 MG tablet   Oral   Take 1,000 mg by mouth daily. Take 1000mg  daily, then increases to 3000mg  daily if feeling illness         . aspirin 81 MG chewable tablet   Oral   Chew 81 mg by mouth daily.           Marland Kitchen atorvastatin (LIPITOR) 20 MG tablet   Oral   Take 1 tablet (20 mg total) by mouth daily.   30 tablet   2   . CALCIUM/MAGNESIUM/ZINC FORMULA PO   Oral   Take 3,000 mg by mouth daily.         Marland Kitchen DIOVAN 80 MG tablet      TAKE 1 TABLET (80 MG TOTAL) BY MOUTH DAILY.   30 tablet   5  CYCLE FILL MEDICATION. Authorization is required f ...   . doxycycline (VIBRAMYCIN) 100 MG capsule   Oral   Take 1 capsule (100 mg total) by mouth 2 (two) times daily.   20 capsule   0   . fish oil-omega-3 fatty acids 1000 MG capsule   Oral   Take 1 g by mouth daily.         . Ginkgo Biloba 40 MG TABS   Oral   Take by mouth.           Marland Kitchen glucose blood (FREESTYLE LITE) test strip   Other   1 each by Other route as needed. Use as instructed          . hydrochlorothiazide (HYDRODIURIL) 25 MG tablet   Oral   Take 1 tablet (25 mg total) by mouth daily.   100 tablet   1     CYCLE FILL MEDICATION.   . hydrOXYzine (ATARAX/VISTARIL) 25 MG tablet   Oral   Take 1 tablet (25 mg total) by mouth every 6 (six) hours.   12 tablet   0   . Lancets (FREESTYLE) lancets   Other   1 each by Other route as needed.  Use as instructed          . MULTIPLE VITAMIN PO   Oral   Take by mouth.           . potassium chloride SA (K-DUR,KLOR-CON) 20 MEQ tablet   Oral   Take 1 tablet (20 mEq total) by mouth daily.   30 tablet   2   . predniSONE (DELTASONE) 10 MG tablet      3 po qd for 3 days then 2 po qd for 3 days the 1 po qd for 3 days   18 tablet   0   . sitaGLIPtan-metformin (JANUMET) 50-1000 MG per tablet   Oral   Take 1 tablet by mouth 2 (two) times daily with a meal.   60 tablet   2     CYCLE FILL MEDICATION. Authorization is required f ...   . triamcinolone cream (KENALOG) 0.1 %   Topical   Apply topically 2 (two) times daily.   30 g   0    BP 138/91  Pulse 77  Temp(Src) 98.1 F (36.7 C) (Oral)  Resp 20  Ht 5\' 5"  (1.651 m)  Wt 183 lb (83.008 kg)  BMI 30.45 kg/m2  SpO2 98% Physical Exam  Constitutional: She is oriented to person, place, and time. She appears well-developed and well-nourished. No distress.  HENT:  Head: Normocephalic and atraumatic.  Mouth/Throat: Oropharynx is clear and moist.  Eyes: Conjunctivae are normal. Pupils are equal, round, and reactive to light.  Neck: Normal range of motion. Neck supple.  Cardiovascular: Normal rate, regular rhythm and intact distal pulses.   Pulmonary/Chest: Effort normal and breath sounds normal. She has no wheezes. She has no rales.  Abdominal: Soft. Bowel sounds are normal. There is no tenderness. There is no rebound and no guarding.  Musculoskeletal: Normal range of motion.  Neurological: She is alert and oriented to person, place, and time.  Skin: Skin is warm and dry.  Psychiatric: She has a normal mood and affect.    ED Course  Procedures (including critical care time) Labs Reviewed  CBC WITH DIFFERENTIAL - Abnormal; Notable for the following:    Neutrophils Relative % 41 (*)    All other components within normal limits  BASIC METABOLIC PANEL - Abnormal; Notable  for the following:    Potassium 3.4 (*)     Glucose, Bld 128 (*)    GFR calc non Af Amer 79 (*)    All other components within normal limits  TROPONIN I  URINALYSIS, ROUTINE W REFLEX MICROSCOPIC  TROPONIN I   Dg Chest 2 View  01/26/2013   *RADIOLOGY REPORT*  Clinical Data: Chest pain, shortness of breath and dizziness.  CHEST - 2 VIEW  Comparison: PA and lateral chest 05/25/2006.  Findings: The lungs are clear.  Heart size is normal.  No pneumothorax or pleural effusion.  No focal bony abnormality.  IMPRESSION: Negative chest.   Original Report Authenticated By: Holley Dexter, M.D.   No diagnosis found.  MDM   Date: 01/27/2013  Rate: 72  Rhythm: normal sinus rhythm  QRS Axis: normal  Intervals: normal  ST/T Wave abnormalities: normal  Conduction Disutrbances: none  Narrative Interpretation: one PAC   2 negative troponins.  Follow up with your family doctor for ongoing testing of PACs.  Return for chest pain shortness of breath or any concerns   Gwenivere Hiraldo K Canaan Prue-Rasch, MD 01/27/13 [phone]

## 2013-01-27 MED ORDER — ALUM & MAG HYDROXIDE-SIMETH 200-200-20 MG/5ML PO SUSP
ORAL | Status: AC
  Administered 2013-01-27: 15 mL via ORAL
  Filled 2013-01-27: qty 30

## 2013-01-27 MED ORDER — ALUM & MAG HYDROXIDE-SIMETH 200-200-20 MG/5ML PO SUSP: 15.0000 mL | Freq: Once | ORAL | Status: AC

## 2013-01-27 NOTE — ED Notes (Signed)
D/c home with ride 

## 2013-02-02 ENCOUNTER — Other Ambulatory Visit: Payer: Self-pay

## 2013-02-06 ENCOUNTER — Other Ambulatory Visit: Payer: Self-pay | Admitting: Family Medicine

## 2013-03-01 ENCOUNTER — Other Ambulatory Visit: Payer: Self-pay | Admitting: Family Medicine

## 2013-05-22 ENCOUNTER — Ambulatory Visit: Admitting: Internal Medicine

## 2013-06-01 ENCOUNTER — Other Ambulatory Visit: Payer: Self-pay

## 2013-06-12 ENCOUNTER — Ambulatory Visit (INDEPENDENT_AMBULATORY_CARE_PROVIDER_SITE_OTHER): Admitting: Family Medicine

## 2013-06-12 ENCOUNTER — Encounter: Payer: Self-pay | Admitting: Family Medicine

## 2013-06-12 VITALS — BP 132/74 | HR 58 | Temp 98.2°F | Wt 187.2 lb

## 2013-06-12 DIAGNOSIS — E669 Obesity, unspecified: Secondary | ICD-10-CM | POA: Insufficient documentation

## 2013-06-12 DIAGNOSIS — E785 Hyperlipidemia, unspecified: Secondary | ICD-10-CM

## 2013-06-12 DIAGNOSIS — E119 Type 2 diabetes mellitus without complications: Secondary | ICD-10-CM

## 2013-06-12 DIAGNOSIS — E1059 Type 1 diabetes mellitus with other circulatory complications: Secondary | ICD-10-CM

## 2013-06-12 DIAGNOSIS — I10 Essential (primary) hypertension: Secondary | ICD-10-CM

## 2013-06-12 LAB — HEMOGLOBIN A1C: Hgb A1c MFr Bld: 6.5 % (ref 4.6–6.5)

## 2013-06-12 LAB — LIPID PANEL
Cholesterol: 293 mg/dL — ABNORMAL HIGH (ref 0–200)
HDL: 88.4 mg/dL (ref >39.00)

## 2013-06-12 LAB — BASIC METABOLIC PANEL
BUN: 12 mg/dL (ref 6–23)
CO2: 28 mEq/L (ref 19–32)
Chloride: 103 mEq/L (ref 96–112)
GFR: 98.03 mL/min (ref >60.00)
Glucose, Bld: 99 mg/dL (ref 70–99)
Potassium: 3.6 mEq/L (ref 3.5–5.1)
Sodium: 138 mEq/L (ref 135–145)

## 2013-06-12 LAB — CBC WITH DIFFERENTIAL/PLATELET
Basophils Absolute: 0 10*3/uL (ref 0.0–0.1)
HCT: 40.8 % (ref 36.0–46.0)
Hemoglobin: 13.7 g/dL (ref 12.0–15.0)
Lymphs Abs: 1.9 10*3/uL (ref 0.7–4.0)
MCHC: 33.5 g/dL (ref 30.0–36.0)
MCV: 88 fl (ref 78.0–100.0)
Monocytes Absolute: 0.3 10*3/uL (ref 0.1–1.0)
Monocytes Relative: 6.9 % (ref 3.0–12.0)
Neutro Abs: 1.8 10*3/uL (ref 1.4–7.7)
Platelets: 321 10*3/uL (ref 150.0–400.0)
RDW: 14.5 % (ref 11.5–14.6)

## 2013-06-12 LAB — HEPATIC FUNCTION PANEL
Albumin: 4.2 g/dL (ref 3.5–5.2)
Alkaline Phosphatase: 56 U/L (ref 39–117)
Total Protein: 7.3 g/dL (ref 6.0–8.3)

## 2013-06-12 NOTE — Assessment & Plan Note (Signed)
Check labs 
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
## 2013-06-12 NOTE — Assessment & Plan Note (Signed)
Check labs Cont' meds 

## 2013-06-12 NOTE — Patient Instructions (Signed)

## 2013-06-12 NOTE — Assessment & Plan Note (Signed)
Stable con't meds 

## 2013-06-12 NOTE — Progress Notes (Signed)
Pre visit review using our clinic review tool, if applicable. No additional management support is needed unless otherwise documented below in the visit note. 

## 2013-06-17 ENCOUNTER — Other Ambulatory Visit: Payer: Self-pay | Admitting: Family Medicine

## 2013-06-19 MED ORDER — ATORVASTATIN CALCIUM 10 MG PO TABS: 10.0000 mg | ORAL_TABLET | Freq: Every day | ORAL | Status: DC

## 2013-07-08 DIAGNOSIS — S2220XA Unspecified fracture of sternum, initial encounter for closed fracture: Secondary | ICD-10-CM

## 2013-07-08 DIAGNOSIS — S2239XA Fracture of one rib, unspecified side, initial encounter for closed fracture: Secondary | ICD-10-CM

## 2013-07-08 HISTORY — DX: Fracture of one rib, unspecified side, initial encounter for closed fracture: S22.39XA

## 2013-07-08 HISTORY — DX: Unspecified fracture of sternum, initial encounter for closed fracture: S22.20XA

## 2013-07-25 ENCOUNTER — Encounter: Payer: Self-pay | Admitting: Family Medicine

## 2013-07-25 ENCOUNTER — Ambulatory Visit (INDEPENDENT_AMBULATORY_CARE_PROVIDER_SITE_OTHER): Admitting: Family Medicine

## 2013-07-25 VITALS — BP 136/88 | HR 58 | Temp 98.7°F | Wt 187.8 lb

## 2013-07-25 DIAGNOSIS — S2239XA Fracture of one rib, unspecified side, initial encounter for closed fracture: Secondary | ICD-10-CM

## 2013-07-25 DIAGNOSIS — S2232XA Fracture of one rib, left side, initial encounter for closed fracture: Secondary | ICD-10-CM

## 2013-07-25 DIAGNOSIS — S2220XA Unspecified fracture of sternum, initial encounter for closed fracture: Secondary | ICD-10-CM
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
## 2013-07-25 NOTE — Progress Notes (Signed)
Pre visit review using our clinic review tool, if applicable. No additional management support is needed unless otherwise documented below in the visit note. 

## 2013-07-25 NOTE — Assessment & Plan Note (Signed)
Healing well F/u prn  

## 2013-08-10 ENCOUNTER — Other Ambulatory Visit: Payer: Self-pay | Admitting: Family Medicine

## 2014-02-09 ENCOUNTER — Telehealth: Payer: Self-pay

## 2014-02-09 NOTE — Telephone Encounter (Signed)
Per diabetic bundle, pt in need of yearly CPX//LMOVM to call back to schedule.

## 2014-03-16 ENCOUNTER — Encounter: Payer: Self-pay | Admitting: Family Medicine

## 2014-03-16 ENCOUNTER — Ambulatory Visit (INDEPENDENT_AMBULATORY_CARE_PROVIDER_SITE_OTHER): Admitting: Family Medicine

## 2014-03-16 ENCOUNTER — Ambulatory Visit (HOSPITAL_BASED_OUTPATIENT_CLINIC_OR_DEPARTMENT_OTHER)
Admission: RE | Admit: 2014-03-16 | Discharge: 2014-03-16 | Disposition: A | Discharge status: Home / Self Care | Source: Ambulatory Visit | Attending: Family Medicine | Admitting: Family Medicine

## 2014-03-16 ENCOUNTER — Other Ambulatory Visit (HOSPITAL_COMMUNITY)
Admission: RE | Admit: 2014-03-16 | Discharge: 2014-03-16 | Disposition: A | Discharge status: Home / Self Care | Source: Ambulatory Visit | Attending: Family Medicine | Admitting: Family Medicine

## 2014-03-16 VITALS — BP 132/76 | HR 54 | Temp 97.6°F | Ht 66.0 in | Wt 186.0 lb

## 2014-03-16 DIAGNOSIS — Z1151 Encounter for screening for human papillomavirus (HPV): Secondary | ICD-10-CM | POA: Diagnosis present

## 2014-03-16 DIAGNOSIS — IMO0002 Reserved for concepts with insufficient information to code with codable children: Secondary | ICD-10-CM | POA: Diagnosis present

## 2014-03-16 DIAGNOSIS — IMO0001 Reserved for inherently not codable concepts without codable children: Secondary | ICD-10-CM | POA: Insufficient documentation

## 2014-03-16 DIAGNOSIS — Z01419 Encounter for gynecological examination (general) (routine) without abnormal findings: Secondary | ICD-10-CM | POA: Insufficient documentation

## 2014-03-16 DIAGNOSIS — S2220XD Unspecified fracture of sternum, subsequent encounter for fracture with routine healing: Secondary | ICD-10-CM

## 2014-03-16 DIAGNOSIS — I10 Essential (primary) hypertension: Secondary | ICD-10-CM

## 2014-03-16 DIAGNOSIS — F411 Generalized anxiety disorder: Secondary | ICD-10-CM

## 2014-03-16 DIAGNOSIS — S2232XS Fracture of one rib, left side, sequela: Secondary | ICD-10-CM

## 2014-03-16 DIAGNOSIS — Z0000 Encounter for general adult medical examination without abnormal findings: Secondary | ICD-10-CM

## 2014-03-16 DIAGNOSIS — E785 Hyperlipidemia, unspecified: Secondary | ICD-10-CM

## 2014-03-16 DIAGNOSIS — Z124 Encounter for screening for malignant neoplasm of cervix: Secondary | ICD-10-CM

## 2014-03-16 DIAGNOSIS — E1159 Type 2 diabetes mellitus with other circulatory complications: Secondary | ICD-10-CM

## 2014-03-16 MED ORDER — ALPRAZOLAM 0.25 MG PO TABS: 0.2500 mg | ORAL_TABLET | Freq: Three times a day (TID) | ORAL | Status: DC | PRN

## 2014-03-16 NOTE — Progress Notes (Signed)
Pre visit review using our clinic review tool, if applicable. No additional management support is needed unless otherwise documented below in the visit note. 
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
## 2014-03-16 NOTE — Patient Instructions (Signed)
Preventive Care for Adults A healthy lifestyle and preventive care can promote health and wellness. Preventive health guidelines for women include the following key practices.  A routine yearly physical is a good way to check with your health care provider about your health and preventive screening. It is a chance to share any concerns and updates on your health and to receive a thorough exam.  Visit your dentist for a routine exam and preventive care every 6 months. Brush your teeth twice a day and floss once a day. Good oral hygiene prevents tooth decay and gum disease.  The frequency of eye exams is based on your age, health, family medical history, use of contact lenses, and other factors. Follow your health care provider's recommendations for frequency of eye exams.  Eat a healthy diet. Foods like vegetables, fruits, whole grains, low-fat dairy products, and lean protein foods contain the nutrients you need without too many calories. Decrease your intake of foods high in solid fats, added sugars, and salt. Eat the right amount of calories for you.Get information about a proper diet from your health care provider, if necessary.  Regular physical exercise is one of the most important things you can do for your health. Most adults should get at least 150 minutes of moderate-intensity exercise (any activity that increases your heart rate and causes you to sweat) each week. In addition, most adults need muscle-strengthening exercises on 2 or more days a week.  Maintain a healthy weight. The body mass index (BMI) is a screening tool to identify possible weight problems. It provides an estimate of body fat based on height and weight. Your health care provider can find your BMI and can help you achieve or maintain a healthy weight.For adults 20 years and older:  A BMI below 18.5 is considered underweight.  A BMI of 18.5 to 24.9 is normal.  A BMI of 25 to 29.9 is considered overweight.  A BMI of  30 and above is considered obese.  Maintain normal blood lipids and cholesterol levels by exercising and minimizing your intake of saturated fat. Eat a balanced diet with plenty of fruit and vegetables. Blood tests for lipids and cholesterol should begin at age 76 and be repeated every 5 years. If your lipid or cholesterol levels are high, you are over 50, or you are at high risk for heart disease, you may need your cholesterol levels checked more frequently.Ongoing high lipid and cholesterol levels should be treated with medicines if diet and exercise are not working.  If you smoke, find out from your health care provider how to quit. If you do not use tobacco, do not start.  Lung cancer screening is recommended for adults aged 22-80 years who are at high risk for developing lung cancer because of a history of smoking. A yearly low-dose CT scan of the lungs is recommended for people who have at least a 30-pack-year history of smoking and are a current smoker or have quit within the past 15 years. A pack year of smoking is smoking an average of 1 pack of cigarettes a day for 1 year (for example: 1 pack a day for 30 years or 2 packs a day for 15 years). Yearly screening should continue until the smoker has stopped smoking for at least 15 years. Yearly screening should be stopped for people who develop a health problem that would prevent them from having lung cancer treatment.  If you are pregnant, do not drink alcohol. If you are breastfeeding,  be very cautious about drinking alcohol. If you are not pregnant and choose to drink alcohol, do not have more than 1 drink per day. One drink is considered to be 12 ounces (355 mL) of beer, 5 ounces (148 mL) of wine, or 1.5 ounces (44 mL) of liquor.  Avoid use of street drugs. Do not share needles with anyone. Ask for help if you need support or instructions about stopping the use of drugs.  High blood pressure causes heart disease and increases the risk of  stroke. Your blood pressure should be checked at least every 1 to 2 years. Ongoing high blood pressure should be treated with medicines if weight loss and exercise do not work.  If you are 75-52 years old, ask your health care provider if you should take aspirin to prevent strokes.  Diabetes screening involves taking a blood sample to check your fasting blood sugar level. This should be done once every 3 years, after age 15, if you are within normal weight and without risk factors for diabetes. Testing should be considered at a younger age or be carried out more frequently if you are overweight and have at least 1 risk factor for diabetes.  Breast cancer screening is essential preventive care for women. You should practice "breast self-awareness." This means understanding the normal appearance and feel of your breasts and may include breast self-examination. Any changes detected, no matter how small, should be reported to a health care provider. Women in their 58s and 30s should have a clinical breast exam (CBE) by a health care provider as part of a regular health exam every 1 to 3 years. After age 16, women should have a CBE every year. Starting at age 53, women should consider having a mammogram (breast X-ray test) every year. Women who have a family history of breast cancer should talk to their health care provider about genetic screening. Women at a high risk of breast cancer should talk to their health care providers about having an MRI and a mammogram every year.  Breast cancer gene (BRCA)-related cancer risk assessment is recommended for women who have family members with BRCA-related cancers. BRCA-related cancers include breast, ovarian, tubal, and peritoneal cancers. Having family members with these cancers may be associated with an increased risk for harmful changes (mutations) in the breast cancer genes BRCA1 and BRCA2. Results of the assessment will determine the need for genetic counseling and  BRCA1 and BRCA2 testing.  Routine pelvic exams to screen for cancer are no longer recommended for nonpregnant women who are considered low risk for cancer of the pelvic organs (ovaries, uterus, and vagina) and who do not have symptoms. Ask your health care provider if a screening pelvic exam is right for you.  If you have had past treatment for cervical cancer or a condition that could lead to cancer, you need Pap tests and screening for cancer for at least 20 years after your treatment. If Pap tests have been discontinued, your risk factors (such as having a new sexual partner) need to be reassessed to determine if screening should be resumed. Some women have medical problems that increase the chance of getting cervical cancer. In these cases, your health care provider may recommend more frequent screening and Pap tests.  The HPV test is an additional test that may be used for cervical cancer screening. The HPV test looks for the virus that can cause the cell changes on the cervix. The cells collected during the Pap test can be  tested for HPV. The HPV test could be used to screen women aged 30 years and older, and should be used in women of any age who have unclear Pap test results. After the age of 30, women should have HPV testing at the same frequency as a Pap test.  Colorectal cancer can be detected and often prevented. Most routine colorectal cancer screening begins at the age of 50 years and continues through age 75 years. However, your health care provider may recommend screening at an earlier age if you have risk factors for colon cancer. On a yearly basis, your health care provider may provide home test kits to check for hidden blood in the stool. Use of a small camera at the end of a tube, to directly examine the colon (sigmoidoscopy or colonoscopy), can detect the earliest forms of colorectal cancer. Talk to your health care provider about this at age 50, when routine screening begins. Direct  exam of the colon should be repeated every 5-10 years through age 75 years, unless early forms of pre-cancerous polyps or small growths are found.  People who are at an increased risk for hepatitis B should be screened for this virus. You are considered at high risk for hepatitis B if:  You were born in a country where hepatitis B occurs often. Talk with your health care provider about which countries are considered high risk.  Your parents were born in a high-risk country and you have not received a shot to protect against hepatitis B (hepatitis B vaccine).  You have HIV or AIDS.  You use needles to inject street drugs.  You live with, or have sex with, someone who has hepatitis B.  You get hemodialysis treatment.  You take certain medicines for conditions like cancer, organ transplantation, and autoimmune conditions.  Hepatitis C blood testing is recommended for all people born from 1945 through 1965 and any individual with known risks for hepatitis C.  Practice safe sex. Use condoms and avoid high-risk sexual practices to reduce the spread of sexually transmitted infections (STIs). STIs include gonorrhea, chlamydia, syphilis, trichomonas, herpes, HPV, and human immunodeficiency virus (HIV). Herpes, HIV, and HPV are viral illnesses that have no cure. They can result in disability, cancer, and death.  You should be screened for sexually transmitted illnesses (STIs) including gonorrhea and chlamydia if:  You are sexually active and are younger than 24 years.  You are older than 24 years and your health care provider tells you that you are at risk for this type of infection.  Your sexual activity has changed since you were last screened and you are at an increased risk for chlamydia or gonorrhea. Ask your health care provider if you are at risk.  If you are at risk of being infected with HIV, it is recommended that you take a prescription medicine daily to prevent HIV infection. This is  called preexposure prophylaxis (PrEP). You are considered at risk if:  You are a heterosexual woman, are sexually active, and are at increased risk for HIV infection.  You take drugs by injection.  You are sexually active with a partner who has HIV.  Talk with your health care provider about whether you are at high risk of being infected with HIV. If you choose to begin PrEP, you should first be tested for HIV. You should then be tested every 3 months for as long as you are taking PrEP.  Osteoporosis is a disease in which the bones lose minerals and strength   with aging. This can result in serious bone fractures or breaks. The risk of osteoporosis can be identified using a bone density scan. Women ages 65 years and over and women at risk for fractures or osteoporosis should discuss screening with their health care providers. Ask your health care provider whether you should take a calcium supplement or vitamin D to reduce the rate of osteoporosis.  Menopause can be associated with physical symptoms and risks. Hormone replacement therapy is available to decrease symptoms and risks. You should talk to your health care provider about whether hormone replacement therapy is right for you.  Use sunscreen. Apply sunscreen liberally and repeatedly throughout the day. You should seek shade when your shadow is shorter than you. Protect yourself by wearing long sleeves, pants, a wide-brimmed hat, and sunglasses year round, whenever you are outdoors.  Once a month, do a whole body skin exam, using a mirror to look at the skin on your back. Tell your health care provider of new moles, moles that have irregular borders, moles that are larger than a pencil eraser, or moles that have changed in shape or color.  Stay current with required vaccines (immunizations).  Influenza vaccine. All adults should be immunized every year.  Tetanus, diphtheria, and acellular pertussis (Td, Tdap) vaccine. Pregnant women should  receive 1 dose of Tdap vaccine during each pregnancy. The dose should be obtained regardless of the length of time since the last dose. Immunization is preferred during the 27th-36th week of gestation. An adult who has not previously received Tdap or who does not know her vaccine status should receive 1 dose of Tdap. This initial dose should be followed by tetanus and diphtheria toxoids (Td) booster doses every 10 years. Adults with an unknown or incomplete history of completing a 3-dose immunization series with Td-containing vaccines should begin or complete a primary immunization series including a Tdap dose. Adults should receive a Td booster every 10 years.  Varicella vaccine. An adult without evidence of immunity to varicella should receive 2 doses or a second dose if she has previously received 1 dose. Pregnant females who do not have evidence of immunity should receive the first dose after pregnancy. This first dose should be obtained before leaving the health care facility. The second dose should be obtained 4-8 weeks after the first dose.  Human papillomavirus (HPV) vaccine. Females aged 13-26 years who have not received the vaccine previously should obtain the 3-dose series. The vaccine is not recommended for use in pregnant females. However, pregnancy testing is not needed before receiving a dose. If a female is found to be pregnant after receiving a dose, no treatment is needed. In that case, the remaining doses should be delayed until after the pregnancy. Immunization is recommended for any person with an immunocompromised condition through the age of 26 years if she did not get any or all doses earlier. During the 3-dose series, the second dose should be obtained 4-8 weeks after the first dose. The third dose should be obtained 24 weeks after the first dose and 16 weeks after the second dose.  Zoster vaccine. One dose is recommended for adults aged 60 years or older unless certain conditions are  present.  Measles, mumps, and rubella (MMR) vaccine. Adults born before 1957 generally are considered immune to measles and mumps. Adults born in 1957 or later should have 1 or more doses of MMR vaccine unless there is a contraindication to the vaccine or there is laboratory evidence of immunity to   each of the three diseases. A routine second dose of MMR vaccine should be obtained at least 28 days after the first dose for students attending postsecondary schools, health care workers, or international travelers. People who received inactivated measles vaccine or an unknown type of measles vaccine during 1963-1967 should receive 2 doses of MMR vaccine. People who received inactivated mumps vaccine or an unknown type of mumps vaccine before 1979 and are at high risk for mumps infection should consider immunization with 2 doses of MMR vaccine. For females of childbearing age, rubella immunity should be determined. If there is no evidence of immunity, females who are not pregnant should be vaccinated. If there is no evidence of immunity, females who are pregnant should delay immunization until after pregnancy. Unvaccinated health care workers born before 1957 who lack laboratory evidence of measles, mumps, or rubella immunity or laboratory confirmation of disease should consider measles and mumps immunization with 2 doses of MMR vaccine or rubella immunization with 1 dose of MMR vaccine.  Pneumococcal 13-valent conjugate (PCV13) vaccine. When indicated, a person who is uncertain of her immunization history and has no record of immunization should receive the PCV13 vaccine. An adult aged 19 years or older who has certain medical conditions and has not been previously immunized should receive 1 dose of PCV13 vaccine. This PCV13 should be followed with a dose of pneumococcal polysaccharide (PPSV23) vaccine. The PPSV23 vaccine dose should be obtained at least 8 weeks after the dose of PCV13 vaccine. An adult aged 19  years or older who has certain medical conditions and previously received 1 or more doses of PPSV23 vaccine should receive 1 dose of PCV13. The PCV13 vaccine dose should be obtained 1 or more years after the last PPSV23 vaccine dose.  Pneumococcal polysaccharide (PPSV23) vaccine. When PCV13 is also indicated, PCV13 should be obtained first. All adults aged 65 years and older should be immunized. An adult younger than age 65 years who has certain medical conditions should be immunized. Any person who resides in a nursing home or long-term care facility should be immunized. An adult smoker should be immunized. People with an immunocompromised condition and certain other conditions should receive both PCV13 and PPSV23 vaccines. People with human immunodeficiency virus (HIV) infection should be immunized as soon as possible after diagnosis. Immunization during chemotherapy or radiation therapy should be avoided. Routine use of PPSV23 vaccine is not recommended for American Indians, [place] Natives, or people younger than 65 years unless there are medical conditions that require PPSV23 vaccine. When indicated, people who have unknown immunization and have no record of immunization should receive PPSV23 vaccine. One-time revaccination 5 years after the first dose of PPSV23 is recommended for people aged 19-64 years who have chronic kidney failure, nephrotic syndrome, asplenia, or immunocompromised conditions. People who received 1-2 doses of PPSV23 before age 65 years should receive another dose of PPSV23 vaccine at age 65 years or later if at least 5 years have passed since the previous dose. Doses of PPSV23 are not needed for people immunized with PPSV23 at or after age 65 years.  Meningococcal vaccine. Adults with asplenia or persistent complement component deficiencies should receive 2 doses of quadrivalent meningococcal conjugate (MenACWY-D) vaccine. The doses should be obtained at least 2 months apart.  Microbiologists working with certain meningococcal bacteria, military recruits, people at risk during an outbreak, and people who travel to or live in countries with a high rate of meningitis should be immunized. A first-year college student up through age   21 years who is living in a residence hall should receive a dose if she did not receive a dose on or after her 16th birthday. Adults who have certain high-risk conditions should receive one or more doses of vaccine.  Hepatitis A vaccine. Adults who wish to be protected from this disease, have certain high-risk conditions, work with hepatitis A-infected animals, work in hepatitis A research labs, or travel to or work in countries with a high rate of hepatitis A should be immunized. Adults who were previously unvaccinated and who anticipate close contact with an international adoptee during the first 60 days after arrival in the [place] from a country with a high rate of hepatitis A should be immunized.  Hepatitis B vaccine. Adults who wish to be protected from this disease, have certain high-risk conditions, may be exposed to blood or other infectious body fluids, are household contacts or sex partners of hepatitis B positive people, are clients or workers in certain care facilities, or travel to or work in countries with a high rate of hepatitis B should be immunized.  Haemophilus influenzae type b (Hib) vaccine. A previously unvaccinated person with asplenia or sickle cell disease or having a scheduled splenectomy should receive 1 dose of Hib vaccine. Regardless of previous immunization, a recipient of a hematopoietic stem cell transplant should receive a 3-dose series 6-12 months after her successful transplant. Hib vaccine is not recommended for adults with HIV infection. Preventive Services / Frequency Ages 64 to 68 years  Blood pressure check.** / Every 1 to 2 years.  Lipid and cholesterol check.** / Every 5 years beginning at age  22.  Clinical breast exam.** / Every 3 years for women in their 88s and 53s.  BRCA-related cancer risk assessment.** / For women who have family members with a BRCA-related cancer (breast, ovarian, tubal, or peritoneal cancers).  Pap test.** / Every 2 years from ages 90 through 51. Every 3 years starting at age 21 through age 56 or 3 with a history of 3 consecutive normal Pap tests.  HPV screening.** / Every 3 years from ages 24 through ages 1 to 46 with a history of 3 consecutive normal Pap tests.  Hepatitis C blood test.** / For any individual with known risks for hepatitis C.  Skin self-exam. / Monthly.  Influenza vaccine. / Every year.  Tetanus, diphtheria, and acellular pertussis (Tdap, Td) vaccine.** / Consult your health care provider. Pregnant women should receive 1 dose of Tdap vaccine during each pregnancy. 1 dose of Td every 10 years.  Varicella vaccine.** / Consult your health care provider. Pregnant females who do not have evidence of immunity should receive the first dose after pregnancy.  HPV vaccine. / 3 doses over 6 months, if 72 and younger. The vaccine is not recommended for use in pregnant females. However, pregnancy testing is not needed before receiving a dose.  Measles, mumps, rubella (MMR) vaccine.** / You need at least 1 dose of MMR if you were born in 1957 or later. You may also need a 2nd dose. For females of childbearing age, rubella immunity should be determined. If there is no evidence of immunity, females who are not pregnant should be vaccinated. If there is no evidence of immunity, females who are pregnant should delay immunization until after pregnancy.  Pneumococcal 13-valent conjugate (PCV13) vaccine.** / Consult your health care provider.  Pneumococcal polysaccharide (PPSV23) vaccine.** / 1 to 2 doses if you smoke cigarettes or if you have certain conditions.  Meningococcal vaccine.** /  1 dose if you are age 19 to 21 years and a first-year college  student living in a residence hall, or have one of several medical conditions, you need to get vaccinated against meningococcal disease. You may also need additional booster doses.  Hepatitis A vaccine.** / Consult your health care provider.  Hepatitis B vaccine.** / Consult your health care provider.  Haemophilus influenzae type b (Hib) vaccine.** / Consult your health care provider. Ages 40 to 64 years  Blood pressure check.** / Every 1 to 2 years.  Lipid and cholesterol check.** / Every 5 years beginning at age 20 years.  Lung cancer screening. / Every year if you are aged 55-80 years and have a 30-pack-year history of smoking and currently smoke or have quit within the past 15 years. Yearly screening is stopped once you have quit smoking for at least 15 years or develop a health problem that would prevent you from having lung cancer treatment.  Clinical breast exam.** / Every year after age 40 years.  BRCA-related cancer risk assessment.** / For women who have family members with a BRCA-related cancer (breast, ovarian, tubal, or peritoneal cancers).  Mammogram.** / Every year beginning at age 40 years and continuing for as long as you are in good health. Consult with your health care provider.  Pap test.** / Every 3 years starting at age 30 years through age 65 or 70 years with a history of 3 consecutive normal Pap tests.  HPV screening.** / Every 3 years from ages 30 years through ages 65 to 70 years with a history of 3 consecutive normal Pap tests.  Fecal occult blood test (FOBT) of stool. / Every year beginning at age 50 years and continuing until age 75 years. You may not need to do this test if you get a colonoscopy every 10 years.  Flexible sigmoidoscopy or colonoscopy.** / Every 5 years for a flexible sigmoidoscopy or every 10 years for a colonoscopy beginning at age 50 years and continuing until age 75 years.  Hepatitis C blood test.** / For all people born from 1945 through  1965 and any individual with known risks for hepatitis C.  Skin self-exam. / Monthly.  Influenza vaccine. / Every year.  Tetanus, diphtheria, and acellular pertussis (Tdap/Td) vaccine.** / Consult your health care provider. Pregnant women should receive 1 dose of Tdap vaccine during each pregnancy. 1 dose of Td every 10 years.  Varicella vaccine.** / Consult your health care provider. Pregnant females who do not have evidence of immunity should receive the first dose after pregnancy.  Zoster vaccine.** / 1 dose for adults aged 60 years or older.  Measles, mumps, rubella (MMR) vaccine.** / You need at least 1 dose of MMR if you were born in 1957 or later. You may also need a 2nd dose. For females of childbearing age, rubella immunity should be determined. If there is no evidence of immunity, females who are not pregnant should be vaccinated. If there is no evidence of immunity, females who are pregnant should delay immunization until after pregnancy.  Pneumococcal 13-valent conjugate (PCV13) vaccine.** / Consult your health care provider.  Pneumococcal polysaccharide (PPSV23) vaccine.** / 1 to 2 doses if you smoke cigarettes or if you have certain conditions.  Meningococcal vaccine.** / Consult your health care provider.  Hepatitis A vaccine.** / Consult your health care provider.  Hepatitis B vaccine.** / Consult your health care provider.  Haemophilus influenzae type b (Hib) vaccine.** / Consult your health care provider. Ages 65   years and over  Blood pressure check.** / Every 1 to 2 years.  Lipid and cholesterol check.** / Every 5 years beginning at age 22 years.  Lung cancer screening. / Every year if you are aged 73-80 years and have a 30-pack-year history of smoking and currently smoke or have quit within the past 15 years. Yearly screening is stopped once you have quit smoking for at least 15 years or develop a health problem that would prevent you from having lung cancer  treatment.  Clinical breast exam.** / Every year after age 4 years.  BRCA-related cancer risk assessment.** / For women who have family members with a BRCA-related cancer (breast, ovarian, tubal, or peritoneal cancers).  Mammogram.** / Every year beginning at age 40 years and continuing for as long as you are in good health. Consult with your health care provider.  Pap test.** / Every 3 years starting at age 9 years through age 34 or 91 years with 3 consecutive normal Pap tests. Testing can be stopped between 65 and 70 years with 3 consecutive normal Pap tests and no abnormal Pap or HPV tests in the past 10 years.  HPV screening.** / Every 3 years from ages 57 years through ages 64 or 45 years with a history of 3 consecutive normal Pap tests. Testing can be stopped between 65 and 70 years with 3 consecutive normal Pap tests and no abnormal Pap or HPV tests in the past 10 years.  Fecal occult blood test (FOBT) of stool. / Every year beginning at age 15 years and continuing until age 17 years. You may not need to do this test if you get a colonoscopy every 10 years.  Flexible sigmoidoscopy or colonoscopy.** / Every 5 years for a flexible sigmoidoscopy or every 10 years for a colonoscopy beginning at age 86 years and continuing until age 71 years.  Hepatitis C blood test.** / For all people born from [birth year] through 1964 and any individual with known risks for hepatitis C.  Osteoporosis screening.** / A one-time screening for women ages 83 years and over and women at risk for fractures or osteoporosis.  Skin self-exam. / Monthly.  Influenza vaccine. / Every year.  Tetanus, diphtheria, and acellular pertussis (Tdap/Td) vaccine.** / 1 dose of Td every 10 years.  Varicella vaccine.** / Consult your health care provider.  Zoster vaccine.** / 1 dose for adults aged 61 years or older.  Pneumococcal 13-valent conjugate (PCV13) vaccine.** / Consult your health care provider.  Pneumococcal  polysaccharide (PPSV23) vaccine.** / 1 dose for all adults aged 28 years and older.  Meningococcal vaccine.** / Consult your health care provider.  Hepatitis A vaccine.** / Consult your health care provider.  Hepatitis B vaccine.** / Consult your health care provider.  Haemophilus influenzae type b (Hib) vaccine.** / Consult your health care provider. ** Family history and personal history of risk and conditions may change your health care provider's recommendations. Document Released: 09/08/2001 Document Revised: 11/27/2013 Document Reviewed: 12/08/2010 [facility] Patient Information 2015 [place], [place]. This information is not intended to replace advice given to you by your health care provider. Make sure you discuss any questions you have with your health care provider.

## 2014-03-17 LAB — CBC WITH DIFFERENTIAL/PLATELET
BASOS PCT: 0.6 % (ref 0.0–3.0)
Basophils Absolute: 0 10*3/uL (ref 0.0–0.1)
EOS PCT: 2.9 % (ref 0.0–5.0)
Eosinophils Absolute: 0.1 10*3/uL (ref 0.0–0.7)
HCT: 40.9 % (ref 36.0–46.0)
Hemoglobin: 13.6 g/dL (ref 12.0–15.0)
LYMPHS PCT: 39.9 % (ref 12.0–46.0)
Lymphs Abs: 1.7 10*3/uL (ref 0.7–4.0)
MCHC: 33.2 g/dL (ref 30.0–36.0)
MCV: 91.6 fl (ref 78.0–100.0)
MONO ABS: 0.3 10*3/uL (ref 0.1–1.0)
MONOS PCT: 6.7 % (ref 3.0–12.0)
NEUTROS PCT: 49.9 % (ref 43.0–77.0)
Neutro Abs: 2.2 10*3/uL (ref 1.4–7.7)
Platelets: 274 10*3/uL (ref 150.0–400.0)
RBC: 4.47 Mil/uL (ref 3.87–5.11)
RDW: 14.4 % (ref 11.5–15.5)
WBC: 4.4 10*3/uL (ref 4.0–10.5)

## 2014-03-17 LAB — LIPID PANEL
CHOL/HDL RATIO: 3
Cholesterol: 291 mg/dL — ABNORMAL HIGH (ref 0–200)
HDL: 98.8 mg/dL (ref >39.00)
LDL CALC: 182 mg/dL — AB (ref 0–99)
NonHDL: 192.2
Triglycerides: 52 mg/dL (ref 0.0–149.0)
VLDL: 10.4 mg/dL (ref 0.0–40.0)

## 2014-03-17 LAB — BASIC METABOLIC PANEL
BUN: 13 mg/dL (ref 6–23)
CHLORIDE: 102 meq/L (ref 96–112)
CO2: 28 mEq/L (ref 19–32)
CREATININE: 0.8 mg/dL (ref 0.4–1.2)
Calcium: 9.2 mg/dL (ref 8.4–10.5)
GFR: 90.93 mL/min (ref >60.00)
Glucose, Bld: 76 mg/dL (ref 70–99)
Potassium: 3.9 mEq/L (ref 3.5–5.1)
Sodium: 138 mEq/L (ref 135–145)

## 2014-03-17 LAB — MICROALBUMIN / CREATININE URINE RATIO
CREATININE, U: 47.6 mg/dL
MICROALB UR: 0.2 mg/dL (ref 0.0–1.9)
Microalb Creat Ratio: 0.4 mg/g (ref 0.0–30.0)

## 2014-03-17 LAB — HEMOGLOBIN A1C: HEMOGLOBIN A1C: 6.1 % (ref 4.6–6.5)

## 2014-03-17 LAB — TSH: TSH: 0.15 u[IU]/mL — AB (ref 0.35–4.50)

## 2014-03-17 LAB — HEPATIC FUNCTION PANEL
ALK PHOS: 55 U/L (ref 39–117)
ALT: 32 U/L (ref 0–35)
AST: 33 U/L (ref 0–37)
Albumin: 4.2 g/dL (ref 3.5–5.2)
BILIRUBIN DIRECT: 0.1 mg/dL (ref 0.0–0.3)
BILIRUBIN TOTAL: 1.1 mg/dL (ref 0.2–1.2)
Total Protein: 7 g/dL (ref 6.0–8.3)

## 2014-03-19 LAB — POCT URINALYSIS DIPSTICK
Bilirubin, UA: NEGATIVE
Glucose, UA: NEGATIVE
KETONES UA: NEGATIVE
Leukocytes, UA: NEGATIVE
Nitrite, UA: NEGATIVE
PROTEIN UA: NEGATIVE
RBC UA: NEGATIVE
UROBILINOGEN UA: 0.2
pH, UA: 6

## 2014-03-19 LAB — CYTOLOGY - PAP

## 2014-03-26 ENCOUNTER — Telehealth: Payer: Self-pay

## 2014-03-26 DIAGNOSIS — E0590 Thyrotoxicosis, unspecified without thyrotoxic crisis or storm: Secondary | ICD-10-CM

## 2014-03-26 MED ORDER — ATORVASTATIN CALCIUM 20 MG PO TABS: 20.0000 mg | ORAL_TABLET | Freq: Every day | ORAL | Status: DC

## 2014-03-26 NOTE — Telephone Encounter (Signed)
Spoke with patient and she voiced understanding of abnormal labs, I advised TSH was also low and Dr.Lowne would like to do a repeat along with a T3 and T4. She voiced understanding, Orders in.    KP

## 2014-03-26 NOTE — Telephone Encounter (Signed)
Message copied by Ewing Schlein on Mon Mar 26, 2014 12:05 PM ------      Message from: Rosalita Chessman      Created: Mon Mar 19, 2014  1:02 PM       Cholesterol--- LDL goal < 70,  HDL >40,  TG < 150.  Diet and exercise will increase HDL and decrease LDL and TG.  Fish,  Fish Oil, Flaxseed oil will also help increase the HDL and decrease Triglycerides.   Recheck labs in 3 months------ increase Lipitor 20 mg #30  1 each night, 2 refills      Rest of labs are good.            250.00 272.4  Lipid , hep, bmp, hgba1c.             ------

## 2014-03-28 ENCOUNTER — Other Ambulatory Visit (INDEPENDENT_AMBULATORY_CARE_PROVIDER_SITE_OTHER)

## 2014-03-28 DIAGNOSIS — E0590 Thyrotoxicosis, unspecified without thyrotoxic crisis or storm: Secondary | ICD-10-CM

## 2014-03-29 LAB — T4, FREE: Free T4: 0.92 ng/dL (ref 0.60–1.60)

## 2014-03-29 LAB — TSH: TSH: 0.45 u[IU]/mL (ref 0.35–4.50)

## 2014-03-29 LAB — T3, FREE: T3 FREE: 2.9 pg/mL (ref 2.3–4.2)

## 2014-04-20 ENCOUNTER — Encounter: Admitting: Family Medicine

## 2014-05-03 ENCOUNTER — Encounter: Admitting: Family Medicine

## 2014-07-09 ENCOUNTER — Encounter: Payer: Self-pay | Admitting: Family Medicine

## 2014-07-09 ENCOUNTER — Ambulatory Visit (INDEPENDENT_AMBULATORY_CARE_PROVIDER_SITE_OTHER): Admitting: Family Medicine

## 2014-07-09 VITALS — BP 140/80 | HR 75 | Temp 99.2°F | Wt 188.4 lb

## 2014-07-09 DIAGNOSIS — E785 Hyperlipidemia, unspecified: Secondary | ICD-10-CM

## 2014-07-09 DIAGNOSIS — E119 Type 2 diabetes mellitus without complications: Secondary | ICD-10-CM

## 2014-07-09 DIAGNOSIS — E669 Obesity, unspecified: Secondary | ICD-10-CM

## 2014-07-09 DIAGNOSIS — F411 Generalized anxiety disorder: Secondary | ICD-10-CM

## 2014-07-09 DIAGNOSIS — I10 Essential (primary) hypertension: Secondary | ICD-10-CM

## 2014-07-09 DIAGNOSIS — E1169 Type 2 diabetes mellitus with other specified complication: Secondary | ICD-10-CM

## 2014-07-09 MED ORDER — ALPRAZOLAM 0.25 MG PO TABS: 0.2500 mg | ORAL_TABLET | Freq: Three times a day (TID) | ORAL | Status: DC | PRN

## 2014-07-09 MED ORDER — HYDROCHLOROTHIAZIDE 25 MG PO TABS: 25.0000 mg | ORAL_TABLET | Freq: Every day | ORAL | Status: DC

## 2014-07-09 NOTE — Progress Notes (Signed)
Pre visit review using our clinic review tool, if applicable. No additional management support is needed unless otherwise documented below in the visit note. 
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
## 2014-07-09 NOTE — Patient Instructions (Signed)

## 2014-07-17 ENCOUNTER — Other Ambulatory Visit

## 2014-07-24 ENCOUNTER — Other Ambulatory Visit (INDEPENDENT_AMBULATORY_CARE_PROVIDER_SITE_OTHER)

## 2014-07-24 ENCOUNTER — Other Ambulatory Visit: Payer: Self-pay

## 2014-07-24 DIAGNOSIS — I10 Essential (primary) hypertension: Secondary | ICD-10-CM

## 2014-07-24 DIAGNOSIS — E119 Type 2 diabetes mellitus without complications: Secondary | ICD-10-CM

## 2014-07-24 DIAGNOSIS — Z1231 Encounter for screening mammogram for malignant neoplasm of breast: Secondary | ICD-10-CM

## 2014-07-24 DIAGNOSIS — F411 Generalized anxiety disorder: Secondary | ICD-10-CM

## 2014-07-24 DIAGNOSIS — E1169 Type 2 diabetes mellitus with other specified complication: Secondary | ICD-10-CM

## 2014-07-24 DIAGNOSIS — E785 Hyperlipidemia, unspecified: Secondary | ICD-10-CM

## 2014-07-24 DIAGNOSIS — E669 Obesity, unspecified: Secondary | ICD-10-CM

## 2014-07-24 LAB — HEPATIC FUNCTION PANEL
ALK PHOS: 54 U/L (ref 39–117)
ALT: 24 U/L (ref 0–35)
AST: 24 U/L (ref 0–37)
Albumin: 4.2 g/dL (ref 3.5–5.2)
BILIRUBIN DIRECT: 0 mg/dL (ref 0.0–0.3)
BILIRUBIN TOTAL: 0.8 mg/dL (ref 0.2–1.2)
TOTAL PROTEIN: 6.9 g/dL (ref 6.0–8.3)

## 2014-07-24 LAB — LIPID PANEL
CHOLESTEROL: 310 mg/dL — AB (ref 0–200)
HDL: 87.9 mg/dL (ref >39.00)
LDL CALC: 209 mg/dL — AB (ref 0–99)
NONHDL: 222.1
Total CHOL/HDL Ratio: 4
Triglycerides: 65 mg/dL (ref 0.0–149.0)
VLDL: 13 mg/dL (ref 0.0–40.0)

## 2014-07-24 LAB — BASIC METABOLIC PANEL
BUN: 11 mg/dL (ref 6–23)
CO2: 29 mEq/L (ref 19–32)
Calcium: 9 mg/dL (ref 8.4–10.5)
Chloride: 104 mEq/L (ref 96–112)
Creatinine, Ser: 0.8 mg/dL (ref 0.4–1.2)
GFR: 94.82 mL/min (ref >60.00)
GLUCOSE: 102 mg/dL — AB (ref 70–99)
POTASSIUM: 3.7 meq/L (ref 3.5–5.1)
Sodium: 139 mEq/L (ref 135–145)

## 2014-07-24 LAB — POCT URINALYSIS DIPSTICK
Blood, UA: NEGATIVE
Glucose, UA: NEGATIVE
KETONES UA: NEGATIVE
LEUKOCYTES UA: NEGATIVE
Nitrite, UA: NEGATIVE
Protein, UA: NEGATIVE
Spec Grav, UA: 1.02
Urobilinogen, UA: 0.2
pH, UA: 6

## 2014-07-24 LAB — MICROALBUMIN / CREATININE URINE RATIO
Creatinine,U: 108.1 mg/dL
MICROALB UR: 0.4 mg/dL (ref 0.0–1.9)
Microalb Creat Ratio: 0.4 mg/g (ref 0.0–30.0)

## 2014-07-24 LAB — HEMOGLOBIN A1C: Hgb A1c MFr Bld: 6.3 % (ref 4.6–6.5)

## 2014-07-30 ENCOUNTER — Telehealth: Payer: Self-pay | Admitting: Family Medicine

## 2014-07-30 DIAGNOSIS — Z1231 Encounter for screening mammogram for malignant neoplasm of breast: Secondary | ICD-10-CM

## 2014-07-30 DIAGNOSIS — E2839 Other primary ovarian failure: Secondary | ICD-10-CM

## 2014-07-30 NOTE — Telephone Encounter (Signed)
Mammogram order is already in and a detailed message left advising BMD in and she can call to schedule.     KP

## 2014-07-30 NOTE — Telephone Encounter (Signed)
Caller name: Eshika, Reckart Relation to pt: self  Call back number: [phone]   Reason for call:  Pt requesting a RX for Bone Density and Mamo please send to [facility]

## 2014-08-01 ENCOUNTER — Other Ambulatory Visit: Payer: Self-pay

## 2014-08-01 DIAGNOSIS — Z1231 Encounter for screening mammogram for malignant neoplasm of breast: Secondary | ICD-10-CM

## 2014-08-06 ENCOUNTER — Encounter (HOSPITAL_BASED_OUTPATIENT_CLINIC_OR_DEPARTMENT_OTHER): Payer: Self-pay

## 2014-08-06 ENCOUNTER — Ambulatory Visit: Admitting: Family Medicine

## 2014-08-06 ENCOUNTER — Emergency Department (HOSPITAL_BASED_OUTPATIENT_CLINIC_OR_DEPARTMENT_OTHER)
Admission: EM | Admit: 2014-08-06 | Discharge: 2014-08-06 | Disposition: A | Discharge status: Home / Self Care | Attending: Emergency Medicine | Admitting: Emergency Medicine

## 2014-08-06 DIAGNOSIS — Z880 Allergy status to penicillin: Secondary | ICD-10-CM | POA: Diagnosis not present

## 2014-08-06 DIAGNOSIS — M545 Low back pain: Secondary | ICD-10-CM | POA: Diagnosis not present

## 2014-08-06 DIAGNOSIS — Z8781 Personal history of (healed) traumatic fracture: Secondary | ICD-10-CM | POA: Diagnosis not present

## 2014-08-06 DIAGNOSIS — E119 Type 2 diabetes mellitus without complications: Secondary | ICD-10-CM | POA: Diagnosis not present

## 2014-08-06 DIAGNOSIS — I10 Essential (primary) hypertension: Secondary | ICD-10-CM | POA: Diagnosis not present

## 2014-08-06 DIAGNOSIS — Z79899 Other long term (current) drug therapy: Secondary | ICD-10-CM | POA: Insufficient documentation

## 2014-08-06 HISTORY — DX: Fracture of one rib, unspecified side, initial encounter for closed fracture: S22.39XA

## 2014-08-06 HISTORY — DX: Unspecified fracture of sternum, initial encounter for closed fracture: S22.20XA

## 2014-08-06 LAB — URINALYSIS, ROUTINE W REFLEX MICROSCOPIC
Bilirubin Urine: NEGATIVE
GLUCOSE, UA: NEGATIVE mg/dL
Hgb urine dipstick: NEGATIVE
KETONES UR: NEGATIVE mg/dL
LEUKOCYTES UA: NEGATIVE
Nitrite: NEGATIVE
PH: 7 (ref 5.0–8.0)
Protein, ur: NEGATIVE mg/dL
SPECIFIC GRAVITY, URINE: 1.003 — AB (ref 1.005–1.030)
UROBILINOGEN UA: 0.2 mg/dL (ref 0.0–1.0)

## 2014-08-06 LAB — BASIC METABOLIC PANEL
Anion gap: 6 (ref 5–15)
BUN: 9 mg/dL (ref 6–23)
CO2: 26 mmol/L (ref 19–32)
CREATININE: 0.67 mg/dL (ref 0.50–1.10)
Calcium: 9 mg/dL (ref 8.4–10.5)
Chloride: 104 mEq/L (ref 96–112)
GFR calc non Af Amer: >90 mL/min (ref >90)
Glucose, Bld: 88 mg/dL (ref 70–99)
POTASSIUM: 3.7 mmol/L (ref 3.5–5.1)
Sodium: 136 mmol/L (ref 135–145)

## 2014-08-06 NOTE — ED Notes (Signed)
D/c home- no new rx given

## 2014-08-06 NOTE — ED Notes (Signed)
MD at bedside. 

## 2014-08-06 NOTE — ED Provider Notes (Addendum)
CSN: [id number]     Arrival date & time 08/06/14  1335 History   First MD Initiated Contact with Patient 08/06/14 1426     Chief Complaint  Patient presents with  . Hypertension     (Consider location/radiation/quality/duration/timing/severity/associated sxs/prior Treatment) Patient is a 63 y.o. female presenting with hypertension. The history is provided by the patient.  Hypertension This is a new problem. Episode onset: 3 days ago. The problem occurs constantly. The problem has not changed since onset.Pertinent negatives include no chest pain, no abdominal pain, no headaches and no shortness of breath. Associated symptoms comments: States for the last 3-4 days some occasional low back pain but not consistent.  Also occassional right sided sinus pressure for the last 1-2 weeks but no fever, bloody discharge or persistent sinus pain.  Pt did have some salty meals over the weekend which is unusual but no new meds and no OTC meds.  Checks BP weekly and normally is 124/60.Marland Kitchen Nothing aggravates the symptoms. Nothing relieves the symptoms. She has tried nothing for the symptoms. The treatment provided no relief.    Past Medical History  Diagnosis Date  . Hypertension   . Diabetes mellitus   . Hyperlipemia   . Sternum fx   . Rib fracture    Past Surgical History  Procedure Laterality Date  . Ovarian cyst surgery  1980  . Colonoscopy    . Polypectomy    . Cesarean section    . Breast lumpectomy      left   Family History  Problem Relation Age of Onset  . Coronary artery disease    . Prostate cancer    . Diabetes Mother   . Heart disease Mother 47    MI  . Cancer Father 48    prostate  . Diabetes Maternal Grandmother   . Cancer Sister 13    colon CA  . Diabetes Brother   . Colon cancer Neg Hx    History  Substance Use Topics  . Smoking status: Never Smoker   . Smokeless tobacco: Never Used  . Alcohol Use: Yes     Comment: occ   OB History    No data available      Review of Systems  HENT: Positive for sinus pressure.   Respiratory: Negative for shortness of breath.   Cardiovascular: Negative for chest pain.  Gastrointestinal: Negative for abdominal pain.  Genitourinary: Negative for dysuria.  Neurological: Negative for weakness and headaches.  All other systems reviewed and are negative.     Allergies  Amoxil; Azor; Bystolic; Crestor; Diltiazem; Lipitor; Lisinopril; Prednisone; Sulfonamide derivatives; Diovan; and Hctz  Home Medications   Prior to Admission medications   Medication Sig Start Date End Date Taking? Authorizing Provider  ALPRAZolam (XANAX) 0.25 MG tablet Take 1 tablet (0.25 mg total) by mouth 3 (three) times daily as needed for anxiety. 07/09/14   Rosalita Chessman, DO  Ascorbic Acid (VITAMIN C) 100 MG tablet Take 1,000 mg by mouth daily. Take 1000mg  daily, then increases to 3000mg  daily if feeling illness    Historical Provider, MD  CALCIUM/MAGNESIUM/ZINC FORMULA PO Take 3,000 mg by mouth daily.    Historical Provider, MD  fish oil-omega-3 fatty acids 1000 MG capsule Take 1 g by mouth daily.    Historical Provider, MD  Ginkgo Biloba 40 MG TABS Take by mouth.      Historical Provider, MD  glucose blood (FREESTYLE LITE) test strip 1 each by Other route as needed. Use as instructed  Historical Provider, MD  Lancets (FREESTYLE) lancets 1 each by Other route as needed. Use as instructed     Historical Provider, MD  MULTIPLE VITAMIN PO Take by mouth.      Historical Provider, MD  potassium chloride SA (K-DUR,KLOR-CON) 20 MEQ tablet Take 1 tablet (20 mEq total) by mouth daily. 11/03/12   Yvonne R Lowne, DO   BP 188/96 mmHg  Pulse 56  Temp(Src) 97.9 F (36.6 C) (Oral)  Resp 18  Ht 5\' 5"  (1.651 m)  Wt 183 lb (83.008 kg)  BMI 30.45 kg/m2  SpO2 100% Physical Exam  Constitutional: She is oriented to person, place, and time. She appears well-developed and well-nourished. No distress.  HENT:  Head: Normocephalic and atraumatic.   Right Ear: Tympanic membrane and ear canal normal.  Left Ear: Tympanic membrane and ear canal normal.  Mouth/Throat: Oropharynx is clear and moist.  Eyes: Conjunctivae and EOM are normal. Pupils are equal, round, and reactive to light.  Neck: Normal range of motion. Neck supple.  Cardiovascular: Normal rate, regular rhythm and intact distal pulses.   No murmur heard. Pulmonary/Chest: Effort normal and breath sounds normal. No respiratory distress. She has no wheezes. She has no rales.  Abdominal: Soft. She exhibits no distension. There is no tenderness. There is no rebound and no guarding.  Musculoskeletal: Normal range of motion. She exhibits no edema or tenderness.  Neurological: She is alert and oriented to person, place, and time.  Skin: Skin is warm and dry. No rash noted. No erythema.  Psychiatric: She has a normal mood and affect. Her behavior is normal.  Nursing note and vitals reviewed.   ED Course  Procedures (including critical care time) Labs Review Labs Reviewed  URINALYSIS, ROUTINE W REFLEX MICROSCOPIC - Abnormal; Notable for the following:    Specific Gravity, Urine 1.003 (*)    All other components within normal limits  BASIC METABOLIC PANEL    Imaging Review No results found.   EKG Interpretation None      MDM   Final diagnoses:  Essential hypertension    Patient here with new onset hypertension for the last 3 days. She does check her blood pressure regularly and normally runs about 124/80 however for the last 3 days that's been anywhere from 160-180. She denies taking any new over-the-counter medications but did admit to eating several salty meals over the weekendand also having intermittent low back pain and feeling slightly feverish. She has had right-sided nasal congestion for 2weekswithout symptoms consistent with sinusitis. She's had no bloody drainage, fever or severe sinus pain. She says her nose is just stuffy. She denies chest pain, shortness of  breath, abdominal pain, dysuria.  Will ensure normal renal function with a BMP and a UA. We'll continue to follow blood pressure.  3:47 PM BMP and UA within normal limits. On recheck patient's blood pressure is 146/82. Also with further discussion of the patient she states she started eating. She has which she normally does not eat what could also be contributing to her hypertension. She will stop eating nuts continue to follow her blood pressure and follow-up with her PCP  Blanchie Dessert, MD 08/06/14 [place], MD 08/06/14 [id number]

## 2014-08-06 NOTE — Discharge Instructions (Signed)

## 2014-08-06 NOTE — ED Notes (Signed)
C/o elevated BP x 3 days-pt states BP meds stopped per her 1 year ago due to side effects- lower back pain and head congestin x 3 days-no OTC meds except nasal

## 2014-08-06 NOTE — ED Notes (Signed)
Pt reports she has been treating HTN with home remedies and has been controlled until recently.  This am BP was 177/100 per pt.  Sinus symptoms x 2 weeks.

## 2014-08-08 ENCOUNTER — Encounter: Payer: Self-pay | Admitting: Medical

## 2014-08-08 ENCOUNTER — Emergency Department (HOSPITAL_BASED_OUTPATIENT_CLINIC_OR_DEPARTMENT_OTHER)

## 2014-08-08 ENCOUNTER — Encounter (HOSPITAL_BASED_OUTPATIENT_CLINIC_OR_DEPARTMENT_OTHER): Payer: Self-pay | Admitting: *Deleted

## 2014-08-08 ENCOUNTER — Ambulatory Visit (INDEPENDENT_AMBULATORY_CARE_PROVIDER_SITE_OTHER): Admitting: Medical

## 2014-08-08 ENCOUNTER — Observation Stay (HOSPITAL_BASED_OUTPATIENT_CLINIC_OR_DEPARTMENT_OTHER)
Admission: EM | Admit: 2014-08-08 | Discharge: 2014-08-10 | Disposition: A | Discharge status: Home / Self Care | Attending: Interventional Cardiology | Admitting: Interventional Cardiology

## 2014-08-08 VITALS — BP 157/94 | HR 59 | Temp 98.1°F | Ht 66.0 in | Wt 185.2 lb

## 2014-08-08 DIAGNOSIS — E119 Type 2 diabetes mellitus without complications: Secondary | ICD-10-CM

## 2014-08-08 DIAGNOSIS — I200 Unstable angina: Principal | ICD-10-CM | POA: Insufficient documentation

## 2014-08-08 DIAGNOSIS — I77810 Thoracic aortic ectasia: Secondary | ICD-10-CM

## 2014-08-08 DIAGNOSIS — R0789 Other chest pain: Secondary | ICD-10-CM | POA: Insufficient documentation

## 2014-08-08 DIAGNOSIS — E669 Obesity, unspecified: Secondary | ICD-10-CM | POA: Diagnosis present

## 2014-08-08 DIAGNOSIS — R079 Chest pain, unspecified: Secondary | ICD-10-CM | POA: Insufficient documentation

## 2014-08-08 DIAGNOSIS — R0602 Shortness of breath: Secondary | ICD-10-CM

## 2014-08-08 DIAGNOSIS — E785 Hyperlipidemia, unspecified: Secondary | ICD-10-CM | POA: Insufficient documentation

## 2014-08-08 DIAGNOSIS — R6884 Jaw pain: Secondary | ICD-10-CM | POA: Diagnosis not present

## 2014-08-08 DIAGNOSIS — E8881 Metabolic syndrome: Secondary | ICD-10-CM | POA: Diagnosis present

## 2014-08-08 DIAGNOSIS — I10 Essential (primary) hypertension: Secondary | ICD-10-CM | POA: Diagnosis present

## 2014-08-08 DIAGNOSIS — Z8249 Family history of ischemic heart disease and other diseases of the circulatory system: Secondary | ICD-10-CM

## 2014-08-08 HISTORY — DX: Personal history of other diseases of the digestive system: Z87.19

## 2014-08-08 HISTORY — DX: Anxiety disorder, unspecified: F41.9

## 2014-08-08 HISTORY — DX: Type 2 diabetes mellitus without complications: E11.9

## 2014-08-08 LAB — CBC WITH DIFFERENTIAL/PLATELET
BASOS PCT: 1 % (ref 0–1)
Basophils Absolute: 0 10*3/uL (ref 0.0–0.1)
EOS ABS: 0.1 10*3/uL (ref 0.0–0.7)
Eosinophils Relative: 2 % (ref 0–5)
HCT: 42.1 % (ref 36.0–46.0)
Hemoglobin: 14.3 g/dL (ref 12.0–15.0)
LYMPHS ABS: 1.4 10*3/uL (ref 0.7–4.0)
Lymphocytes Relative: 38 % (ref 12–46)
MCH: 30.4 pg (ref 26.0–34.0)
MCHC: 34 g/dL (ref 30.0–36.0)
MCV: 89.6 fL (ref 78.0–100.0)
Monocytes Absolute: 0.4 10*3/uL (ref 0.1–1.0)
Monocytes Relative: 11 % (ref 3–12)
Neutro Abs: 1.8 10*3/uL (ref 1.7–7.7)
Neutrophils Relative %: 48 % (ref 43–77)
PLATELETS: 264 10*3/uL (ref 150–400)
RBC: 4.7 MIL/uL (ref 3.87–5.11)
RDW: 13.4 % (ref 11.5–15.5)
WBC: 3.7 10*3/uL — ABNORMAL LOW (ref 4.0–10.5)

## 2014-08-08 LAB — CBC
HCT: 40.5 % (ref 36.0–46.0)
Hemoglobin: 13.8 g/dL (ref 12.0–15.0)
MCH: 30.1 pg (ref 26.0–34.0)
MCHC: 34.1 g/dL (ref 30.0–36.0)
MCV: 88.4 fL (ref 78.0–100.0)
PLATELETS: 263 10*3/uL (ref 150–400)
RBC: 4.58 MIL/uL (ref 3.87–5.11)
RDW: 13.7 % (ref 11.5–15.5)
WBC: 3.7 10*3/uL — ABNORMAL LOW (ref 4.0–10.5)

## 2014-08-08 LAB — COMPREHENSIVE METABOLIC PANEL
ALBUMIN: 4.5 g/dL (ref 3.5–5.2)
ALK PHOS: 67 U/L (ref 39–117)
ALT: 30 U/L (ref 0–35)
ANION GAP: 5 (ref 5–15)
AST: 28 U/L (ref 0–37)
BUN: 8 mg/dL (ref 6–23)
CO2: 28 mmol/L (ref 19–32)
Calcium: 9.2 mg/dL (ref 8.4–10.5)
Chloride: 104 mEq/L (ref 96–112)
Creatinine, Ser: 0.75 mg/dL (ref 0.50–1.10)
GFR calc Af Amer: >90 mL/min (ref >90)
GFR, EST NON AFRICAN AMERICAN: 89 mL/min — AB (ref >90)
Glucose, Bld: 110 mg/dL — ABNORMAL HIGH (ref 70–99)
Potassium: 3.8 mmol/L (ref 3.5–5.1)
SODIUM: 137 mmol/L (ref 135–145)
Total Bilirubin: 1.2 mg/dL (ref 0.3–1.2)
Total Protein: 7.7 g/dL (ref 6.0–8.3)

## 2014-08-08 LAB — TROPONIN I: Troponin I: <0.03 ng/mL (ref <0.031)

## 2014-08-08 LAB — CREATININE, SERUM
Creatinine, Ser: 0.83 mg/dL (ref 0.50–1.10)
GFR, EST AFRICAN AMERICAN: 86 mL/min — AB (ref >90)
GFR, EST NON AFRICAN AMERICAN: 74 mL/min — AB (ref >90)

## 2014-08-08 MED ORDER — VITAMIN C 500 MG PO TABS: 1000.0000 mg | ORAL_TABLET | Freq: Every day | ORAL | Status: DC

## 2014-08-08 MED ORDER — ONDANSETRON HCL 4 MG/2ML IJ SOLN: 4.0000 mg | Freq: Four times a day (QID) | INTRAMUSCULAR | Status: DC | PRN

## 2014-08-08 MED ORDER — ASPIRIN EC 81 MG PO TBEC
81.0000 mg | DELAYED_RELEASE_TABLET | Freq: Every day | ORAL | Status: DC
  Administered 2014-08-09: 81 mg via ORAL
  Filled 2014-08-08: qty 1

## 2014-08-08 MED ORDER — POTASSIUM CHLORIDE CRYS ER 20 MEQ PO TBCR
20.0000 meq | EXTENDED_RELEASE_TABLET | Freq: Every day | ORAL | Status: DC
  Filled 2014-08-08: qty 1

## 2014-08-08 MED ORDER — ACETAMINOPHEN 325 MG PO TABS
650.0000 mg | ORAL_TABLET | ORAL | Status: DC | PRN
  Administered 2014-08-08: 650 mg via ORAL
  Filled 2014-08-08: qty 2

## 2014-08-08 MED ORDER — TRAMADOL HCL 50 MG PO TABS
50.0000 mg | ORAL_TABLET | Freq: Once | ORAL | Status: AC
  Administered 2014-08-08: 50 mg via ORAL
  Filled 2014-08-08: qty 1

## 2014-08-08 MED ORDER — VITAMIN C 500 MG PO TABS
1000.0000 mg | ORAL_TABLET | Freq: Every day | ORAL | Status: DC
  Administered 2014-08-09: 1000 mg via ORAL
  Filled 2014-08-08: qty 2

## 2014-08-08 MED ORDER — HEPARIN SODIUM (PORCINE) 5000 UNIT/ML IJ SOLN
5000.0000 [IU] | Freq: Three times a day (TID) | INTRAMUSCULAR | Status: DC
  Administered 2014-08-08 – 2014-08-10 (×4): 5000 [IU] via SUBCUTANEOUS
  Filled 2014-08-08 (×5): qty 1

## 2014-08-08 MED ORDER — ASPIRIN 81 MG PO CHEW
324.0000 mg | CHEWABLE_TABLET | Freq: Once | ORAL | Status: AC
  Administered 2014-08-08: 324 mg via ORAL
  Filled 2014-08-08: qty 4

## 2014-08-08 MED ORDER — ALPRAZOLAM 0.25 MG PO TABS: 0.2500 mg | ORAL_TABLET | Freq: Three times a day (TID) | ORAL | Status: DC | PRN

## 2014-08-08 MED ORDER — OMEGA-3 FATTY ACIDS 1000 MG PO CAPS: 1.0000 g | ORAL_CAPSULE | Freq: Every day | ORAL | Status: DC

## 2014-08-08 MED ORDER — OMEGA-3-ACID ETHYL ESTERS 1 G PO CAPS
1.0000 g | ORAL_CAPSULE | Freq: Every day | ORAL | Status: DC
  Administered 2014-08-09: 1 g via ORAL
  Filled 2014-08-08: qty 1

## 2014-08-08 MED ORDER — NITROGLYCERIN 2 % TD OINT
1.0000 [in_us] | TOPICAL_OINTMENT | Freq: Four times a day (QID) | TRANSDERMAL | Status: DC
  Administered 2014-08-08: 1 [in_us] via TOPICAL
  Filled 2014-08-08: qty 1

## 2014-08-08 MED ORDER — NITROGLYCERIN 0.4 MG SL SUBL: 0.4000 mg | SUBLINGUAL_TABLET | SUBLINGUAL | Status: DC | PRN

## 2014-08-08 NOTE — Patient Instructions (Signed)
With your risk factors of htn, hyperlipidemia,  diabetes as well as family hx Mi mom and curren chest t pressure with mild jaw pain, I will send you to the ED now. You need work up for this present pain which can't be done in our office. I have talked with charge nurse and she knows you are on your way.  Follow up with [place] after ED eval.

## 2014-08-08 NOTE — ED Notes (Signed)
Report given to [facility] EMT-P with [facility].

## 2014-08-08 NOTE — ED Notes (Signed)
C/o palpitations since last pm with jaw, shoulder pain on left, no nausea. Has had SOB and dizzy. No other sx.

## 2014-08-08 NOTE — ED Notes (Signed)
[facility] here to transport pt 

## 2014-08-08 NOTE — ED Notes (Signed)
Report called to [facility] to [facility].

## 2014-08-08 NOTE — ED Notes (Signed)
Attempt to call report to 3W. Unable to take report at this time.

## 2014-08-08 NOTE — Assessment & Plan Note (Signed)
With your risk factors of htn, hyperlipidemia,  diabetes as well as family hx Mi mom and curren chest t pressure with mild jaw pain, I will send you to the ED now. You need work up for this present pain which can't be done in our office. I have talked with charge nurse and she knows you are on your way.  Follow up with [place] after ED eval.

## 2014-08-08 NOTE — ED Notes (Signed)
Pt discharged in stable condition with [facility] to transport her. IV intact for transport.

## 2014-08-08 NOTE — Progress Notes (Signed)
Subjective:    Patient ID: Adriana Collins, female    DOB: 1951/06/06, 64 y.o.   MRN: [id number]  HPI   Pt has history of high blood pressure for a couple of years. Pt not on any blood pressure medications.   Pt had some dizziness mild last night and some yesterday. She states this was transient last night. When she would get up to use bathroom would feel woozy/light headed.   Pt had some chest pain last night. Also some jaw pain off and on. Even this am when she was driving here she had jaw pain. This has been going on for a few days as well. Pt did notice a little pain last night in her left arm. That lasted for a minute then went away. Pt states last night her heart was pounding. Pt had pressure mid sternal. Pt states pain was for about 1 hour and 15 minutes.(moderate and constant). When she woke up at 2:30 eased up. This morning faint pain. Mild pressure right now in her midchest. Pt states presently jaw discomfort feels like a ping. Shoulder. Feels fine.  Yesterday also had some palpitations.  Last night some shortness of breath.  Pt is diabetic. Hx of high cholesterol. Htn. No mi history. Mom had MI at age 76 yo. Fatal. Pt lipids are not controlled cholesterol 310. Ldl 209.  Last night chest pressure level 7-8/10. Now pressure is level 3/10.  Past Medical History  Diagnosis Date  . Hypertension   . Diabetes mellitus   . Hyperlipemia   . Sternum fx   . Rib fracture     History   Social History  . Marital Status: Married    Spouse Name: N/A    Number of Children: N/A  . Years of Education: N/A   Occupational History  . Not on file.   Social History Main Topics  . Smoking status: Never Smoker   . Smokeless tobacco: Never Used  . Alcohol Use: Yes     Comment: occ  . Drug Use: No  . Sexual Activity: Not on file   Other Topics Concern  . Not on file   Social History Narrative   Married   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   Regular  exercise- no    Past Surgical History  Procedure Laterality Date  . Ovarian cyst surgery  1980  . Colonoscopy    . Polypectomy    . Cesarean section    . Breast lumpectomy      left    Family History  Problem Relation Age of Onset  . Coronary artery disease    . Prostate cancer    . Diabetes Mother   . Heart disease Mother 21    MI  . Cancer Father 59    prostate  . Diabetes Maternal Grandmother   . Cancer Sister 37    colon CA  . Diabetes Brother   . Colon cancer Neg Hx     Allergies  Allergen Reactions  . Amoxil [Amoxicillin] Other (See Comments)    Loopy feeling  . Azor [Amlodipine-Olmesartan] Other (See Comments)    Pain   . Bystolic [Nebivolol Hcl] Itching  . Crestor [Rosuvastatin] Other (See Comments)    Pain   . Diltiazem Nausea And Vomiting  . Lipitor [Atorvastatin] Other (See Comments)    Pain   . Lisinopril Cough  . Prednisone Other (See Comments)    Elevated BP  . Sulfonamide Derivatives  REACTION: HIVES  . Diovan [Valsartan] Palpitations  . Hctz [Hydrochlorothiazide] Palpitations    No current facility-administered medications on file prior to visit.   Current Outpatient Prescriptions on File Prior to Visit  Medication Sig Dispense Refill  . ALPRAZolam (XANAX) 0.25 MG tablet Take 1 tablet (0.25 mg total) by mouth 3 (three) times daily as needed for anxiety. 20 tablet 0  . Ascorbic Acid (VITAMIN C) 100 MG tablet Take 1,000 mg by mouth daily. Take 1000mg  daily, then increases to 3000mg  daily if feeling illness    . CALCIUM/MAGNESIUM/ZINC FORMULA PO Take 3,000 mg by mouth daily.    . fish oil-omega-3 fatty acids 1000 MG capsule Take 1 g by mouth daily.    . Ginkgo Biloba 40 MG TABS Take by mouth.      Marland Kitchen glucose blood (FREESTYLE LITE) test strip 1 each by Other route as needed. Use as instructed     . Lancets (FREESTYLE) lancets 1 each by Other route as needed. Use as instructed     . MULTIPLE VITAMIN PO Take by mouth.      . potassium  chloride SA (K-DUR,KLOR-CON) 20 MEQ tablet Take 1 tablet (20 mEq total) by mouth daily. 30 tablet 2    BP 157/94 mmHg  Pulse 59  Temp(Src) 98.1 F (36.7 C) (Oral)  Ht 5\' 6"  (1.676 m)  Wt 185 lb 3.2 oz (84.006 kg)  BMI 29.91 kg/m2  SpO2 99%      Review of Systems  Constitutional: Negative for fever, chills and fatigue.  HENT:       Lt jaw pain.  Respiratory: Positive for shortness of breath. Negative for cough and wheezing.   Cardiovascular: Positive for chest pain and palpitations.  Gastrointestinal: Negative for abdominal pain.  Musculoskeletal: Negative for back pain.  Neurological: Negative for tremors, seizures, syncope, facial asymmetry, speech difficulty, weakness, light-headedness, numbness and headaches.  Psychiatric/Behavioral: Negative for behavioral problems and confusion.       Objective:   Physical Exam   General Mental Status- Alert. General Appearance- Not in acute distress.   Skin General: Color- Normal Color. Moisture- Normal Moisture.  Neck Carotid Arteries- Normal color. Moisture- Normal Moisture. No carotid bruits. No JVD.  Chest and Lung Exam Auscultation: Breath Sounds:-Normal.  Cardiovascular Auscultation:Rythm- Regular. Murmurs & Other Heart Sounds:Auscultation of the heart reveals- No Murmurs.  Abdomen Inspection:-Inspeection Normal. Palpation/Percussion:Note:No mass. Palpation and Percussion of the abdomen reveal- Non Tender, Non Distended + BS, no rebound or guarding.    Neurologic Cranial Nerve exam:- CN III-XII intact(No nystagmus), symmetric smile. Drift Test:- No drift. Romberg Exam:- Negative.  Heal to Toe Gait exam:-Normal. Finger to Nose:- Normal/Intact Strength:- 5/5 equal and symmetric strength both upper and lower extremities.       Assessment & Plan:

## 2014-08-08 NOTE — H&P (Addendum)
Patient ID: Adriana Collins MRN: [id number], DOB/AGE: 12-08-1960   Admit date: 08/08/2014   Primary Physician: Garnet Koyanagi, DO Primary Cardiologist: New   Pt. Profile:   64 year old African-American female with history of HTN, HLD, and DM presented to [place] with CP who was subsequently transferred to [facility] for further evaluation  Problem List  Past Medical History  Diagnosis Date  . Hypertension   . Diabetes mellitus   . Hyperlipemia   . Sternum fx   . Rib fracture     Past Surgical History  Procedure Laterality Date  . Ovarian cyst surgery  1980  . Colonoscopy    . Polypectomy    . Cesarean section    . Breast lumpectomy      left     Allergies  Allergies  Allergen Reactions  . Amoxil [Amoxicillin] Other (See Comments)    Loopy feeling  . Azor [Amlodipine-Olmesartan] Other (See Comments)    Pain   . Bystolic [Nebivolol Hcl] Itching  . Crestor [Rosuvastatin] Other (See Comments)    Pain   . Diltiazem Nausea And Vomiting  . Lipitor [Atorvastatin] Other (See Comments)    Pain   . Lisinopril Cough  . Prednisone Other (See Comments)    Elevated BP  . Sulfonamide Derivatives     REACTION: HIVES  . Diovan [Valsartan] Palpitations  . Hctz [Hydrochlorothiazide] Palpitations    HPI  The patient is a 64 year old African-American female with history of HTN, HLD, and DM. She has no prior cardiac history and never had any prior cardiac workup before. According to the patient, she leads a fairly active lifestyle. She walk on the treadmill for 35-40 minutes every single day had a fairly fast pace. She has never experienced prior history of exertional chest discomfort or shortness of breath. The last time she exercised on the treadmill was yesterday morning which she did fine without any chest pain.  She went to her church to attend a meeting between 7 and 8 PM last night. While standing in the church, she noted some substernal dull chest pain  radiating to the left jaw. The chest pain eventually went away. Upon returning home, she tries to go to sleep. However, chest pain came back around 10 PM and lasted around 11:30 PM. She also endorsed some mild shortness of breath, dizziness, however no diaphoresis, fever or cough. She denies any recent lower extremity edema, orthopnea or paroxysmal nocturnal dyspnea. She eventually went to sleep, however given the abnormal symptom, she decided to seek medical attention in the morning of 08/08/2014 at [facility]. On arrival her blood pressure was 149/89. Heart rate 50s. O2 saturation 100% on room air. She has essentially normal CBC and BeMET, and a negative troponin. Only abnormal lab include white blood cell count 3.7, glucose 110. Chest x-ray is negative for acute process. EKG shows sinus bradycardia with diffuse T-wave inversion in V1 through V5 which is chronic. Given the abnormal EKG finding, the patient was transferred to [facility] for further evaluation.  Home Medications  Prior to Admission medications   Medication Sig Start Date End Date Taking? Authorizing Provider  Ascorbic Acid (VITAMIN C) 100 MG tablet Take 1,000 mg by mouth daily. Take 1000mg  daily, then increases to 3000mg  daily if feeling illness   Yes Historical Provider, MD  CALCIUM/MAGNESIUM/ZINC FORMULA PO Take 3,000 mg by mouth daily.   Yes Historical Provider, MD  fish oil-omega-3 fatty acids 1000 MG capsule Take 1 g  by mouth daily.   Yes Historical Provider, MD  Ginkgo Biloba 40 MG TABS Take by mouth.     Yes Historical Provider, MD  glucose blood (FREESTYLE LITE) test strip 1 each by Other route as needed. Use as instructed    Yes Historical Provider, MD  HAWTHORNE BERRY PO Take by mouth.   Yes Historical Provider, MD  Lancets (FREESTYLE) lancets 1 each by Other route as needed. Use as instructed    Yes Historical Provider, MD  MULTIPLE VITAMIN PO Take by mouth.     Yes Historical Provider, MD  Turmeric,  Lear Ng, (CURCUMIN) POWD by Does not apply route.   Yes Historical Provider, MD  ALPRAZolam (XANAX) 0.25 MG tablet Take 1 tablet (0.25 mg total) by mouth 3 (three) times daily as needed for anxiety. 07/09/14   Rosalita Chessman, DO  potassium chloride SA (K-DUR,KLOR-CON) 20 MEQ tablet Take 1 tablet (20 mEq total) by mouth daily. 11/03/12   Rosalita Chessman, DO    Family History  Family History  Problem Relation Age of Onset  . Coronary artery disease    . Prostate cancer    . Diabetes Mother   . Heart disease Mother 53    MI  . Cancer Father 27    prostate  . Diabetes Maternal Grandmother   . Cancer Sister 68    colon CA  . Diabetes Brother   . Colon cancer Neg Hx     Social History  History   Social History  . Marital Status: Married    Spouse Name: N/A    Number of Children: N/A  . Years of Education: N/A   Occupational History  . Not on file.   Social History Main Topics  . Smoking status: Never Smoker   . Smokeless tobacco: Never Used  . Alcohol Use: 0.0 oz/week    0 Not specified per week     Comment: occasionally  . Drug Use: No  . Sexual Activity: Not on file   Other Topics Concern  . Not on file   Social History Narrative   Married   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   Regular exercise- no     Review of Systems General:  No chills, fever, night sweats or weight changes.  Cardiovascular:  No dyspnea on exertion, edema, orthopnea, palpitations, paroxysmal nocturnal dyspnea. +chest pain radiating to L jaw Dermatological: No rash, lesions/masses Respiratory: No cough +dyspnea during CP Urologic: No hematuria, dysuria Abdominal:   No nausea, vomiting, diarrhea, bright red blood per rectum, melena, or hematemesis Neurologic:  No visual changes, wkns, changes in mental status. All other systems reviewed and are otherwise negative except as noted above.  Physical Exam  Blood pressure 120/79, pulse 51, temperature 98.4 F (36.9 C),  temperature source Oral, resp. rate 20, height 5\' 5"  (1.651 m), weight 183 lb (83.008 kg), SpO2 98 %.  General: Pleasant, NAD Psych: Normal affect. Neuro: Alert and oriented X 3. Moves all extremities spontaneously. HEENT: Normal  Neck: Supple without bruits or JVD. Lungs:  Resp regular and unlabored, CTA. Heart: RRR no s3, s4, or murmurs. Abdomen: Soft, non-tender, non-distended, BS + x 4.  Extremities: No clubbing, cyanosis or edema. DP/PT/Radials 2+ and equal bilaterally.  Labs  Troponin (Point of Care Test) No results for input(s): TROPIPOC in the last 72 hours.  Recent Labs  08/08/14 1055  TROPONINI <0.03   Lab Results  Component Value Date   WBC 3.7* 08/08/2014   HGB 14.3  08/08/2014   HCT 42.1 08/08/2014   MCV 89.6 08/08/2014   PLT 264 08/08/2014     Recent Labs Lab 08/08/14 1055  NA 137  K 3.8  CL 104  CO2 28  BUN 8  CREATININE 0.75  CALCIUM 9.2  PROT 7.7  BILITOT 1.2  ALKPHOS 67  ALT 30  AST 28  GLUCOSE 110*   Lab Results  Component Value Date   CHOL 310* 07/24/2014   HDL 87.90 07/24/2014   LDLCALC 209* 07/24/2014   TRIG 65.0 07/24/2014   No results found for: DDIMER   Radiology/Studies  Dg Chest 2 View  08/08/2014   CLINICAL DATA:  Hypertension for 5 days. Mid chest pressure. Heart palpitations. Mild shortness of breath since last night.  EXAM: CHEST  2 VIEW  COMPARISON:  03/16/2014  FINDINGS: The heart size and mediastinal contours are within normal limits. Both lungs are clear. The visualized skeletal structures are unremarkable.  IMPRESSION: No active cardiopulmonary disease.   Electronically Signed   By: Rolm Baptise M.D.   On: 08/08/2014 11:18    ECG  Sinus bradycardia with T-wave inversion anteriorly. When compared to the previous EKG, anterior T wave inversion has been present since at least 2014.  ASSESSMENT AND PLAN  1. Chest pain  - EKG showed TWI in anterior lead which is chronic, interestingly, patient has been exercising every  day for 35-40 minutes without chest pain. Chest pain started last night while she was at rest.  - Cardiac risk factors include hypertension, hyperlipidemia, diabetes, early family history of CAD (mother died of MI at age 48)  - Observe overnight, trend troponin  - will plan for treadmill Myoview in AM  2. HTN-controlled 3. HLD 4. DM- managed by PMD  Signed, Almyra Deforest, PA-C 08/08/2014, 4:47 PM  I have examined the patient and reviewed assessment and plan and discussed with patient.  Agree with above as stated.  Patient currently pain-free. We'll plan for ischemia evaluation with stress test. Some atypical features to her pain but she does have multiple risk factors for coronary artery disease.  Jamel Dunton S.

## 2014-08-08 NOTE — Progress Notes (Signed)
Pre visit review using our clinic review tool, if applicable. No additional management support is needed unless otherwise documented below in the visit note. 

## 2014-08-08 NOTE — ED Notes (Signed)
Pt has no pain at present. Instructed to alert ns when she does.

## 2014-08-08 NOTE — ED Provider Notes (Signed)
CSN: [id number]     Arrival date & time 08/08/14  1035 History   First MD Initiated Contact with Patient 08/08/14 1041     Chief Complaint  Patient presents with  . Palpitations      HPI  Patient presents evaluation of chest pain and palpitations. Seen her primary care physician upstairs and referred to the emergency room. Symptom onset yesterday. Describes a tightening in her anterior chest starting proximal leg o'clock. Seemed to wax and wane over the course of the evening. She went to bed about 10:30. When laying down she was feeling some palpitations as her heart rate beating harder fast. Was not syncopal. Continued with discomfort. She estimates she fell sleep approximately 11:30 or midnight.  Awakened this morning a proximally 7. When up and around her house started feeling tightness in her left chest and a tightness in her left jaw. No palpitations this morning.  No history of heart disease. No previous cardiac testing. History of hypertension, diabetes, and hypercholesterolemia. Was offered medications because she began exercising and changed her diet was "control things medically". Recently seen for worsening hypertension. Is not back on her medications as yet.  Lifetime nonsmoker.  Mother died of a "fatal heart attack" at age 20.  Past Medical History  Diagnosis Date  . Hypertension   . Diabetes mellitus   . Hyperlipemia   . Sternum fx   . Rib fracture    Past Surgical History  Procedure Laterality Date  . Ovarian cyst surgery  1980  . Colonoscopy    . Polypectomy    . Cesarean section    . Breast lumpectomy      left   Family History  Problem Relation Age of Onset  . Coronary artery disease    . Prostate cancer    . Diabetes Mother   . Heart disease Mother 40    MI  . Cancer Father 40    prostate  . Diabetes Maternal Grandmother   . Cancer Sister 69    colon CA  . Diabetes Brother   . Colon cancer Neg Hx    History  Substance Use Topics  . Smoking  status: Never Smoker   . Smokeless tobacco: Never Used  . Alcohol Use: Yes     Comment: occ   OB History    No data available     Review of Systems  Constitutional: Negative for fever, chills, diaphoresis, appetite change and fatigue.  HENT: Negative for mouth sores, sore throat and trouble swallowing.        Left jaw pain  Eyes: Negative for visual disturbance.  Respiratory: Positive for chest tightness and shortness of breath. Negative for cough and wheezing.   Cardiovascular: Positive for chest pain.  Gastrointestinal: Negative for nausea, vomiting, abdominal pain, diarrhea and abdominal distention.  Endocrine: Negative for polydipsia, polyphagia and polyuria.  Genitourinary: Negative for dysuria, frequency and hematuria.  Musculoskeletal: Negative for gait problem.  Skin: Negative for color change, pallor and rash.  Neurological: Negative for dizziness, syncope, light-headedness and headaches.  Hematological: Does not bruise/bleed easily.  Psychiatric/Behavioral: Negative for behavioral problems and confusion.      Allergies  Amoxil; Azor; Bystolic; Crestor; Diltiazem; Lipitor; Lisinopril; Prednisone; Sulfonamide derivatives; Diovan; and Hctz  Home Medications   Prior to Admission medications   Medication Sig Start Date End Date Taking? Authorizing Provider  Ascorbic Acid (VITAMIN C) 100 MG tablet Take 1,000 mg by mouth daily. Take 1000mg  daily, then increases to 3000mg  daily if feeling  illness   Yes Historical Provider, MD  CALCIUM/MAGNESIUM/ZINC FORMULA PO Take 3,000 mg by mouth daily.   Yes Historical Provider, MD  fish oil-omega-3 fatty acids 1000 MG capsule Take 1 g by mouth daily.   Yes Historical Provider, MD  Ginkgo Biloba 40 MG TABS Take by mouth.     Yes Historical Provider, MD  glucose blood (FREESTYLE LITE) test strip 1 each by Other route as needed. Use as instructed    Yes Historical Provider, MD  HAWTHORNE BERRY PO Take by mouth.   Yes Historical Provider,  MD  Lancets (FREESTYLE) lancets 1 each by Other route as needed. Use as instructed    Yes Historical Provider, MD  MULTIPLE VITAMIN PO Take by mouth.     Yes Historical Provider, MD  Turmeric, Lear Ng, (CURCUMIN) POWD by Does not apply route.   Yes Historical Provider, MD  ALPRAZolam (XANAX) 0.25 MG tablet Take 1 tablet (0.25 mg total) by mouth 3 (three) times daily as needed for anxiety. 07/09/14   Rosalita Chessman, DO  potassium chloride SA (K-DUR,KLOR-CON) 20 MEQ tablet Take 1 tablet (20 mEq total) by mouth daily. 11/03/12   Yvonne R Lowne, DO   BP 161/89 mmHg  Pulse 50  Temp(Src) 97.9 F (36.6 C) (Oral)  Resp 11  Ht 5\' 5"  (1.651 m)  Wt 183 lb (83.008 kg)  BMI 30.45 kg/m2  SpO2 100% Physical Exam  Constitutional: She is oriented to person, place, and time. She appears well-developed and well-nourished. No distress.  HENT:  Head: Normocephalic.  Eyes: Conjunctivae are normal. Pupils are equal, round, and reactive to light. No scleral icterus.  Neck: Normal range of motion. Neck supple. No thyromegaly present.  Cardiovascular: Normal rate and regular rhythm.  Exam reveals no gallop and no friction rub.   No murmur heard. Pulmonary/Chest: Effort normal and breath sounds normal. No respiratory distress. She has no wheezes. She has no rales.  Abdominal: Soft. Bowel sounds are normal. She exhibits no distension. There is no tenderness. There is no rebound.  Musculoskeletal: Normal range of motion.  Neurological: She is alert and oriented to person, place, and time.  Skin: Skin is warm and dry. No rash noted.  Psychiatric: She has a normal mood and affect. Her behavior is normal.    ED Course  Procedures (including critical care time) Labs Review Labs Reviewed  COMPREHENSIVE METABOLIC PANEL - Abnormal; Notable for the following:    Glucose, Bld 110 (*)    GFR calc non Af Amer 89 (*)    All other components within normal limits  CBC WITH DIFFERENTIAL - Abnormal; Notable for the  following:    WBC 3.7 (*)    All other components within normal limits  TROPONIN I    Imaging Review Dg Chest 2 View  08/08/2014   CLINICAL DATA:  Hypertension for 5 days. Mid chest pressure. Heart palpitations. Mild shortness of breath since last night.  EXAM: CHEST  2 VIEW  COMPARISON:  03/16/2014  FINDINGS: The heart size and mediastinal contours are within normal limits. Both lungs are clear. The visualized skeletal structures are unremarkable.  IMPRESSION: No active cardiopulmonary disease.   Electronically Signed   By: Rolm Baptise M.D.   On: 08/08/2014 11:18     EKG Interpretation   Date/Time:  Wednesday August 08 2014 10:48:51 EST Ventricular Rate:  54 PR Interval:  162 QRS Duration: 100 QT Interval:  472 QTC Calculation: 447 R Axis:   33 Text Interpretation:  Sinus  bradycardia Nonspecific T wave abnormality  Abnormal ECG Confirmed by Jeneen Rinks  MD, [place] ([id number]) on 08/08/2014 10:57:40 AM      MDM   Final diagnoses:  Unstable angina    Symptoms concerning for angina patient with multiple risk factors.  EKG has inverted T waves across her anterior precordium. These were noted previously.  Heart score of 5. Early pain-free. Will need further evaluation to exclude coronary artery disease/ACS.  12:15:  Patient remains pain-free. First troponin normal. I discussed the case with Dr. Irish Lack. He accepts the patient in transfer. 1 inch nitroglycerin paste applied here.. Given aspirin by mouth.    Tanna Furry, MD 08/08/14 1213

## 2014-08-09 ENCOUNTER — Observation Stay (HOSPITAL_COMMUNITY)

## 2014-08-09 ENCOUNTER — Encounter (HOSPITAL_COMMUNITY): Payer: Self-pay | Admitting: Radiology

## 2014-08-09 DIAGNOSIS — R079 Chest pain, unspecified: Secondary | ICD-10-CM | POA: Insufficient documentation

## 2014-08-09 LAB — BASIC METABOLIC PANEL
ANION GAP: 11 (ref 5–15)
BUN: 9 mg/dL (ref 6–23)
CO2: 27 mmol/L (ref 19–32)
Calcium: 9.4 mg/dL (ref 8.4–10.5)
Chloride: 103 mEq/L (ref 96–112)
Creatinine, Ser: 0.83 mg/dL (ref 0.50–1.10)
GFR calc Af Amer: 86 mL/min — ABNORMAL LOW (ref >90)
GFR, EST NON AFRICAN AMERICAN: 74 mL/min — AB (ref >90)
Glucose, Bld: 83 mg/dL (ref 70–99)
POTASSIUM: 3.8 mmol/L (ref 3.5–5.1)
Sodium: 141 mmol/L (ref 135–145)

## 2014-08-09 LAB — TROPONIN I: Troponin I: <0.03 ng/mL (ref <0.031)

## 2014-08-09 LAB — LIPID PANEL
Cholesterol: 289 mg/dL — ABNORMAL HIGH (ref 0–200)
HDL: 86 mg/dL (ref >39)
LDL Cholesterol: 191 mg/dL — ABNORMAL HIGH (ref 0–99)
TRIGLYCERIDES: 59 mg/dL (ref <150)
Total CHOL/HDL Ratio: 3.4 RATIO
VLDL: 12 mg/dL (ref 0–40)

## 2014-08-09 LAB — HEMOGLOBIN A1C
HEMOGLOBIN A1C: 6 % — AB (ref <5.7)
Mean Plasma Glucose: 126 mg/dL — ABNORMAL HIGH (ref <117)

## 2014-08-09 LAB — D-DIMER, QUANTITATIVE: D-Dimer, Quant: 1.55 ug/mL-FEU — ABNORMAL HIGH (ref 0.00–0.48)

## 2014-08-09 MED ORDER — CYCLOBENZAPRINE HCL 10 MG PO TABS
5.0000 mg | ORAL_TABLET | Freq: Two times a day (BID) | ORAL | Status: DC | PRN
  Administered 2014-08-09: 5 mg via ORAL
  Filled 2014-08-09: qty 1

## 2014-08-09 MED ORDER — PRAVASTATIN SODIUM 20 MG PO TABS
20.0000 mg | ORAL_TABLET | Freq: Every day | ORAL | Status: DC
  Administered 2014-08-09: 20 mg via ORAL
  Filled 2014-08-09: qty 1

## 2014-08-09 MED ORDER — CYCLOBENZAPRINE HCL 10 MG PO TABS
10.0000 mg | ORAL_TABLET | Freq: Once | ORAL | Status: AC
  Administered 2014-08-10: 10 mg via ORAL
  Filled 2014-08-09: qty 1

## 2014-08-09 MED ORDER — IOHEXOL 350 MG/ML SOLN
100.0000 mL | Freq: Once | INTRAVENOUS | Status: AC | PRN
  Administered 2014-08-09: 100 mL via INTRAVENOUS

## 2014-08-09 MED ORDER — HYDRALAZINE HCL 10 MG PO TABS
10.0000 mg | ORAL_TABLET | Freq: Three times a day (TID) | ORAL | Status: DC
Start: 2014-08-09 — End: 2014-08-10
  Administered 2014-08-09 – 2014-08-10 (×3): 10 mg via ORAL
  Filled 2014-08-09 (×3): qty 1

## 2014-08-09 MED ORDER — TECHNETIUM TC 99M SESTAMIBI GENERIC - CARDIOLITE
10.0000 | Freq: Once | INTRAVENOUS | Status: AC | PRN
  Administered 2014-08-09: 10 via INTRAVENOUS

## 2014-08-09 MED ORDER — TECHNETIUM TC 99M SESTAMIBI GENERIC - CARDIOLITE
30.0000 | Freq: Once | INTRAVENOUS | Status: AC | PRN
  Administered 2014-08-09: 30 via INTRAVENOUS

## 2014-08-09 NOTE — Progress Notes (Signed)
Pt complains of lower back tightness, ultam last pm did not help, will add flexeril.  Awaiting nuc study.

## 2014-08-09 NOTE — Progress Notes (Addendum)
Patient Name: Adriana Collins Date of Encounter: 08/09/2014     Active Problems:   Unstable angina   Chest pain    SUBJECTIVE  Seen in nuc med for ETT myoview. No CP or SOB. She tolerated the procedure well. She walked 10 min 40 sec on bruce protocol and exceeded her target HR on 134. She reached a max of 146 bmp. No ischemic ECG changes.   CURRENT MEDS . aspirin EC  81 mg Oral Daily  . heparin  5,000 Units Subcutaneous 3 times per day  . omega-3 acid ethyl esters  1 g Oral Daily  . potassium chloride SA  20 mEq Oral Daily  . vitamin C  1,000 mg Oral Daily    OBJECTIVE  Filed Vitals:   08/08/14 1559 08/08/14 2100 08/09/14 0500 08/09/14 0853  BP: 120/79 117/72 129/75 144/96  Pulse: 51 58 58   Temp: 98.4 F (36.9 C) 98.5 F (36.9 C) 98.2 F (36.8 C)   TempSrc: Oral     Resp:  13 12   Height: 5\' 5"  (1.651 m)     Weight:   181 lb (82.101 kg)   SpO2: 98% 97% 96%     Intake/Output Summary (Last 24 hours) at 08/09/14 0921 Last data filed at 08/08/14 2330  Gross per 24 hour  Intake    480 ml  Output    450 ml  Net     30 ml   Filed Weights   08/08/14 1047 08/09/14 0500  Weight: 183 lb (83.008 kg) 181 lb (82.101 kg)    PHYSICAL EXAM  General: Pleasant, NAD. Neuro: Alert and oriented X 3. Moves all extremities spontaneously. Psych: Normal affect. HEENT:  Normal  Neck: Supple without bruits or JVD. Lungs:  Resp regular and unlabored, CTA. Heart: RRR no s3, s4, or murmurs. Abdomen: Soft, non-tender, non-distended, BS + x 4.  Extremities: No clubbing, cyanosis or edema. DP/PT/Radials 2+ and equal bilaterally.  Accessory Clinical Findings  CBC  Recent Labs  08/08/14 1055 08/08/14 1839  WBC 3.7* 3.7*  NEUTROABS 1.8  --   HGB 14.3 13.8  HCT 42.1 40.5  MCV 89.6 88.4  PLT 264 606   Basic Metabolic Panel  Recent Labs  08/08/14 1055 08/08/14 1839 08/09/14 0448  NA 137  --  141  K 3.8  --  3.8  CL 104  --  103  CO2 28  --  27  GLUCOSE 110*   --  83  BUN 8  --  9  CREATININE 0.75 0.83 0.83  CALCIUM 9.2  --  9.4   Liver Function Tests  Recent Labs  08/08/14 1055  AST 28  ALT 30  ALKPHOS 67  BILITOT 1.2  PROT 7.7  ALBUMIN 4.5   No results for input(s): LIPASE, AMYLASE in the last 72 hours. Cardiac Enzymes  Recent Labs  08/08/14 1839 08/08/14 2316 08/09/14 0448  TROPONINI <0.03 <0.03 <0.03   Hemoglobin A1C  Recent Labs  08/08/14 1839  HGBA1C 6.0*   Fasting Lipid Panel  Recent Labs  08/09/14 0448  CHOL 289*  HDL 86  LDLCALC 191*  TRIG 59  CHOLHDL 3.4    TELE  NSR  Radiology/Studies  Dg Chest 2 View  08/08/2014   CLINICAL DATA:  Hypertension for 5 days. Mid chest pressure. Heart palpitations. Mild shortness of breath since last night.  EXAM: CHEST  2 VIEW  COMPARISON:  03/16/2014  FINDINGS: The heart size and mediastinal contours are within normal  limits. Both lungs are clear. The visualized skeletal structures are unremarkable.  IMPRESSION: No active cardiopulmonary disease.   Electronically Signed   By: Rolm Baptise M.D.   On: 08/08/2014 11:18    ASSESSMENT AND PLAN  64 year old African-American female with history of HTN, HLD, and DM presented to [place] with CP who was subsequently transferred to [facility] for further evaluation   1. Chest pain - EKG showed TWI in anterior lead which is chronic, interestingly, patient has been exercising every day for 35-40 minutes without chest pain. Chest pain started last night while she was at rest. - Cardiac risk factors include hypertension, hyperlipidemia, diabetes, early family history of CAD (mother died of MI at age 34) - Troponin neg x3 - ETT myoview today   2. HTN-controlled 3. HLD- uncontrolled LDL 191. Intolerant to statins due to myalgias.  4. DM- HgA1c 6. managed by PMD   Dispo- if nuclear scan returns normal she can likely be discharged home.    Judy Pimple  PA-C  Pager [phone]  I have personally seen and examined patient and agree with note as outlined by Angelena Form PA.  Admitted with CP and rule out for MI.  Now s/p stress myoview and awaiting results. Will check a D-Dimer as well. BP poorly controlled. She has multiple allergies and intolerances to BP meds.   Will add Hydralazine 10mg  TID.  Lipids are very high as well.  She has been intolerant to crestor, zocor and lipitor in the past.  She has not tried Pravachol.  Will start Pravachol 20mg  daily and will need FLP and ALT in 6 weeks.    Signed: Fransico Him, MD [facility] 08/09/2014

## 2014-08-09 NOTE — Progress Notes (Signed)
UR completed 

## 2014-08-09 NOTE — Progress Notes (Signed)
     Negative nuclear stress test. However, D dimer mildly elevated. Will do CTA tomorrow AM to r/o PE in the setting of chest pain.     Angelena Form PA-C  [facility]

## 2014-08-10 ENCOUNTER — Ambulatory Visit

## 2014-08-10 ENCOUNTER — Other Ambulatory Visit

## 2014-08-10 ENCOUNTER — Other Ambulatory Visit: Payer: Self-pay | Admitting: Cardiology

## 2014-08-10 DIAGNOSIS — E669 Obesity, unspecified: Secondary | ICD-10-CM

## 2014-08-10 DIAGNOSIS — I10 Essential (primary) hypertension: Secondary | ICD-10-CM | POA: Diagnosis not present

## 2014-08-10 DIAGNOSIS — R079 Chest pain, unspecified: Secondary | ICD-10-CM

## 2014-08-10 DIAGNOSIS — E785 Hyperlipidemia, unspecified: Secondary | ICD-10-CM | POA: Diagnosis not present

## 2014-08-10 DIAGNOSIS — I200 Unstable angina: Secondary | ICD-10-CM | POA: Diagnosis not present

## 2014-08-10 DIAGNOSIS — Z8249 Family history of ischemic heart disease and other diseases of the circulatory system: Secondary | ICD-10-CM

## 2014-08-10 DIAGNOSIS — R6884 Jaw pain: Secondary | ICD-10-CM | POA: Diagnosis not present

## 2014-08-10 DIAGNOSIS — I77810 Thoracic aortic ectasia: Secondary | ICD-10-CM

## 2014-08-10 MED ORDER — PRAVASTATIN SODIUM 20 MG PO TABS: 20.0000 mg | ORAL_TABLET | Freq: Every day | ORAL | Status: DC

## 2014-08-10 MED ORDER — HYDRALAZINE HCL 10 MG PO TABS: 10.0000 mg | ORAL_TABLET | Freq: Three times a day (TID) | ORAL | Status: DC

## 2014-08-10 MED ORDER — ACETAMINOPHEN 325 MG PO TABS: 650.0000 mg | ORAL_TABLET | ORAL | Status: DC | PRN

## 2014-08-10 MED ORDER — TRAMADOL HCL 50 MG PO TABS
50.0000 mg | ORAL_TABLET | Freq: Once | ORAL | Status: AC
  Administered 2014-08-10: 50 mg via ORAL
  Filled 2014-08-10: qty 1

## 2014-08-10 MED ORDER — ASPIRIN 81 MG PO TBEC
81.0000 mg | DELAYED_RELEASE_TABLET | Freq: Every day | ORAL | Status: DC
Start: 2014-08-10 — End: 2021-10-13

## 2014-08-10 NOTE — Progress Notes (Signed)
    Subjective:  No chest pain  Objective:  Vital Signs in the last 24 hours: Temp:  [98 F (36.7 C)-98.4 F (36.9 C)] 98 F (36.7 C) (01/15 0555) Pulse Rate:  [57-62] 57 (01/15 0555) Resp:  [17-18] 17 (01/15 0555) BP: (129-194)/(87-95) 145/92 mmHg (01/15 0555) SpO2:  [97 %-100 %] 97 % (01/15 0555) Weight:  [182 lb 12.8 oz (82.918 kg)] 182 lb 12.8 oz (82.918 kg) (01/15 0555)  Intake/Output from previous day:  Intake/Output Summary (Last 24 hours) at 08/10/14 0854 Last data filed at 08/10/14 0600  Gross per 24 hour  Intake   1040 ml  Output   2300 ml  Net  -1260 ml    Physical Exam: General appearance: alert, cooperative, no distress and moderately obese Lungs: clear to auscultation bilaterally Heart: regular rate and rhythm   Rate: 57  Rhythm: normal sinus rhythm and sinus bradycardia  Lab Results:  Recent Labs  08/08/14 1055 08/08/14 1839  WBC 3.7* 3.7*  HGB 14.3 13.8  PLT 264 263    Recent Labs  08/08/14 1055 08/08/14 1839 08/09/14 0448  NA 137  --  141  K 3.8  --  3.8  CL 104  --  103  CO2 28  --  27  GLUCOSE 110*  --  83  BUN 8  --  9  CREATININE 0.75 0.83 0.83    Recent Labs  08/08/14 2316 08/09/14 0448  TROPONINI <0.03 <0.03   No results for input(s): INR in the last 72 hours.  Imaging: Imaging results have been reviewed CTA- negative for PE  Cardiac Studies: Myoview negative   Assessment/Plan:   Active Problems:   Chest pain with moderate risk for cardiac etiology   Metabolic syndrome   Dyslipidemia- LDL 191 (hx of statin intol)   Essential hypertension   Obesity (BMI 30)   Family history of coronary artery disease in mother   PLAN: Non cardiac chest pain. She does have dyslipidemia, metabolic syndrome, and uncontrolled HTN.  Unfortunately she has a history of multiple medication intolerance- statins, ACE, ARB, HCTZ, and Ca++ blockers. F/U with PCP, low dose Pravachol added. I would re challenge with an ARB- will review  with MD.   Kerin Ransom PA-C [facility] [phone] 08/10/2014, 8:54 AM   I have personally seen and examined patient and agree with note as outlined by Kerin Ransom PA.  Patient admitted with chest pain and ruled out for MI by enzymes.  Nuclear stress test with no ischemia.  D-Dimer elevated but no PE on chest CT.  CT showed 4cm ascending aortic aneurysm.  Will get a baseline 2D echo as an outpt and follow with serial yearly echo to assess for enlargement of aorta.  She will need aggressive treatment of her HTN.  Started on statin and will need to recheck FLP and ALT in 6 weeks.  I have asked her to purchase a BP cuff and check her BP daily for a week and bring readings to OV.  Stable for discharge home with follow in our office in 1 week.  Signed: Fransico Him, MD [facility] 08/10/2014

## 2014-08-10 NOTE — Progress Notes (Signed)
Patient ID: Adriana Collins,  MRN: [id number], DOB/AGE: Dec 06, 1950 64 y.o.  Admit date: 08/08/2014 Discharge date: 08/10/2014  Primary Care Provider: Garnet Koyanagi, DO Primary Cardiologist: Dr Radford Pax (new)  Discharge Diagnoses Active Problems:   Chest pain with moderate risk for cardiac etiology   Metabolic syndrome   Dyslipidemia- LDL 191 (hx of statin intol)   Essential hypertension   Obesity (BMI 30)   Family history of coronary artery disease in mother   Dilated aortic root    Procedures:  Myoview 08/09/14   Hospital Course:  64 year old African-American female with history of HTN, HLD, family history of CAD, and DM presented to [facility] with CP. She was transferred to [facility] for further evaluation on 08/08/14. She has a history of multiple medication intolerance- statins, ACE, ARB, HCTZ, and Ca++ blockers. She was admitted and ruled out for an MI. Exercise Myoview was negative for ischemia. Pravachol was added for dyslipidemia (LDL 191). D-Dimer was elevated but she had no PE on chest CT. CT did show a 4cm ascending aortic aneurysm. Will get a baseline 2D echo as an outpt and follow with serial yearly echo to assess for enlargement of aorta. She will need aggressive treatment of her HTN. Started on statin and will need to recheck FLP and ALT in 6 weeks. She will obtain a B/P cuff and check her B/P at and bring these results to her office f/u.    Discharge Vitals:  Blood pressure 145/92, pulse 57, temperature 98 F (36.7 C), temperature source Oral, resp. rate 17, height 5\' 5"  (1.651 m), weight 182 lb 12.8 oz (82.918 kg), SpO2 97 %.    Labs: Results for orders placed or performed during the hospital encounter of 08/08/14 (from the past 24 hour(s))  D-dimer, quantitative     Status: Abnormal   Collection Time: 08/09/14 12:38 PM  Result Value Ref Range   D-Dimer, Quant 1.55 (H) 0.00 - 0.48 ug/mL-FEU    Disposition:      Follow-up Information    Follow  up with Sueanne Margarita, MD.   Specialty:  Cardiology   Why:  office will call you   Contact information:   [address] [place] [address] [phone]       Discharge Medications:    Medication List    TAKE these medications        acetaminophen 325 MG tablet  Commonly known as:  TYLENOL  Take 2 tablets (650 mg total) by mouth every 4 (four) hours as needed for headache or mild pain.     ALPRAZolam 0.25 MG tablet  Commonly known as:  XANAX  Take 1 tablet (0.25 mg total) by mouth 3 (three) times daily as needed for anxiety.     aspirin 81 MG EC tablet  Take 1 tablet (81 mg total) by mouth daily.     CALCIUM/MAGNESIUM/ZINC FORMULA PO  Take 3,000 mg by mouth daily.     fish oil-omega-3 fatty acids 1000 MG capsule  Take 1 g by mouth daily.     GARLIC-PARSLEY PO  Take 1 tablet by mouth daily.     Ginkgo Biloba 40 MG Tabs  Take 40 mg by mouth daily.     HAWTHORNE BERRY PO  Take 1 tablet by mouth daily.     MULTIPLE VITAMIN PO  Take 1 tablet by mouth daily.     pravastatin 20 MG tablet  Commonly known as:  PRAVACHOL  Take 1 tablet (  20 mg total) by mouth daily at 6 PM.     vitamin C 100 MG tablet  Take 1,000 mg by mouth daily. Take 1000mg  daily, then increases to 3000mg  daily if feeling illness         Duration of Discharge Encounter: Greater than 30 minutes including physician time.  Angelena Form PA-C 08/10/2014 10:04 AM

## 2014-08-10 NOTE — Progress Notes (Signed)
Pt discharged to home per MD order. Pt received and reviewed all discharge instructions and medication information including follow-up appointments and prescription information. Pt verbalized understanding. Pt alert and oriented at discharge with no complaints of pain. Pt IV and telemetry box removed prior to discharge. Pt escorted to private vehicle via wheelchair by guest services volunteer. Jahmel Flannagan C  

## 2014-08-10 NOTE — Discharge Summary (Signed)
Patient Name Sex DOB SSN    Adriana Collins Female 1951-05-19 [id number]    Progress Notes by Erlene Quan, PA-C at 08/10/2014 9:53 AM    Author: Erlene Quan, PA-C Service: Cardiology Author Type: Physician Assistant   Filed: 08/10/2014 10:04 AM Note Time: 08/10/2014 9:53 AM Status: Cosign Needed   Editor: Erlene Quan, PA-C (Physician Assistant) Cosign Required: Yes   Expand All Collapse All       Patient ID: Adriana Collins,  MRN: [id number], DOB/AGE: [birth year] 64 y.o.  Admit date: 08/08/2014 Discharge date: 08/10/2014  Primary Care Provider: Garnet Koyanagi, DO Primary Cardiologist: Dr Radford Pax (new)  Discharge Diagnoses Active Problems:  Chest pain with moderate risk for cardiac etiology  Metabolic syndrome  Dyslipidemia- LDL 191 (hx of statin intol)  Essential hypertension  Obesity (BMI 30)  Family history of coronary artery disease in mother  Dilated aortic root    Procedures: Myoview 08/09/14   Hospital Course:  64 year old African-American female with history of HTN, HLD, family history of CAD, and DM presented to [facility] with CP. She was transferred to [facility] for further evaluation on 08/08/14. She has a history of multiple medication intolerance- statins, ACE, ARB, HCTZ, and Ca++ blockers. She was admitted and ruled out for an MI. Exercise Myoview was negative for ischemia. Pravachol was added for dyslipidemia (LDL 191). D-Dimer was elevated but she had no PE on chest CT. CT did show a 4cm ascending aortic aneurysm. Will get a baseline 2D echo as an outpt and follow with serial yearly echo to assess for enlargement of aorta. She will need aggressive treatment of her HTN. Started on statin and will need to recheck FLP and ALT in 6 weeks. She will obtain a B/P cuff and check her B/P at and bring these results to her office f/u.    Discharge Vitals:  Blood pressure 145/92, pulse 57, temperature 98 F (36.7 C), temperature source Oral,  resp. rate 17, height 5\' 5"  (1.651 m), weight 182 lb 12.8 oz (82.918 kg), SpO2 97 %.    Labs:  Lab Results Last 24 Hours    Results for orders placed or performed during the hospital encounter of 08/08/14 (from the past 24 hour(s))  D-dimer, quantitative Status: Abnormal   Collection Time: 08/09/14 12:38 PM  Result Value Ref Range   D-Dimer, Quant 1.55 (H) 0.00 - 0.48 ug/mL-FEU      Disposition:      Follow-up Information    Follow up with Sueanne Margarita, MD.   Specialty: Cardiology   Why: office will call you   Contact information:   [address] [place] [address] [phone]       Discharge Medications:    Medication List    TAKE these medications       acetaminophen 325 MG tablet  Commonly known as: TYLENOL  Take 2 tablets (650 mg total) by mouth every 4 (four) hours as needed for headache or mild pain.     ALPRAZolam 0.25 MG tablet  Commonly known as: XANAX  Take 1 tablet (0.25 mg total) by mouth 3 (three) times daily as needed for anxiety.     aspirin 81 MG EC tablet  Take 1 tablet (81 mg total) by mouth daily.     CALCIUM/MAGNESIUM/ZINC FORMULA PO  Take 3,000 mg by mouth daily.     fish oil-omega-3 fatty acids 1000 MG capsule  Take 1 g by mouth daily.  GARLIC-PARSLEY PO  Take 1 tablet by mouth daily.     Ginkgo Biloba 40 MG Tabs  Take 40 mg by mouth daily.     HAWTHORNE BERRY PO  Take 1 tablet by mouth daily.     MULTIPLE VITAMIN PO  Take 1 tablet by mouth daily.     pravastatin 20 MG tablet  Commonly known as: PRAVACHOL  Take 1 tablet (20 mg total) by mouth daily at 6 PM.     vitamin C 100 MG tablet  Take 1,000 mg by mouth daily. Take 1000mg  daily, then increases to 3000mg  daily if feeling illness    Hydralazine 10 mg TID      Duration of Discharge Encounter: Greater than 30 minutes including physician time.  Angelena Form PA-C 08/10/2014 10:04      Agree with above discharge note.  Patient was started in Hydralazine 10mg  TID yesterday and is tolerating well.  Signed: Fransico Him, MD [facility] 08/10/2014

## 2014-08-15 ENCOUNTER — Ambulatory Visit
Admission: RE | Admit: 2014-08-15 | Discharge: 2014-08-15 | Disposition: A | Discharge status: Home / Self Care | Source: Ambulatory Visit | Attending: Family Medicine | Admitting: Family Medicine

## 2014-08-15 ENCOUNTER — Ambulatory Visit
Admission: RE | Admit: 2014-08-15 | Discharge: 2014-08-15 | Disposition: A | Discharge status: Home / Self Care | Source: Ambulatory Visit

## 2014-08-15 DIAGNOSIS — Z1231 Encounter for screening mammogram for malignant neoplasm of breast: Secondary | ICD-10-CM

## 2014-08-15 DIAGNOSIS — E2839 Other primary ovarian failure: Secondary | ICD-10-CM

## 2014-08-16 ENCOUNTER — Other Ambulatory Visit (HOSPITAL_COMMUNITY)

## 2014-08-22 ENCOUNTER — Ambulatory Visit (HOSPITAL_COMMUNITY): Attending: Cardiology | Admitting: Cardiology

## 2014-08-22 DIAGNOSIS — R079 Chest pain, unspecified: Secondary | ICD-10-CM | POA: Diagnosis present

## 2014-08-22 DIAGNOSIS — I10 Essential (primary) hypertension: Secondary | ICD-10-CM | POA: Insufficient documentation

## 2014-08-22 NOTE — Progress Notes (Signed)
Echo performed. 

## 2014-08-24 ENCOUNTER — Ambulatory Visit (HOSPITAL_BASED_OUTPATIENT_CLINIC_OR_DEPARTMENT_OTHER)
Admission: RE | Admit: 2014-08-24 | Discharge: 2014-08-24 | Disposition: A | Discharge status: Home / Self Care | Source: Ambulatory Visit | Attending: Family Medicine | Admitting: Family Medicine

## 2014-08-24 ENCOUNTER — Ambulatory Visit (INDEPENDENT_AMBULATORY_CARE_PROVIDER_SITE_OTHER): Admitting: Family Medicine

## 2014-08-24 ENCOUNTER — Encounter: Payer: Self-pay | Admitting: Family Medicine

## 2014-08-24 VITALS — BP 156/90 | HR 60 | Temp 98.2°F | Wt 186.2 lb

## 2014-08-24 DIAGNOSIS — W1840XA Slipping, tripping and stumbling without falling, unspecified, initial encounter: Secondary | ICD-10-CM | POA: Insufficient documentation

## 2014-08-24 DIAGNOSIS — M79675 Pain in left toe(s): Secondary | ICD-10-CM

## 2014-08-24 NOTE — Patient Instructions (Signed)
Go downstairs and get xray Wear ace and post op shoe at least until you hear from [place] about xray (you can take it off at night) Ice toe for 15-20 min at a time Elevate foot

## 2014-08-24 NOTE — Progress Notes (Signed)
Pre visit review using our clinic review tool, if applicable. No additional management support is needed unless otherwise documented below in the visit note. 

## 2014-08-24 NOTE — Progress Notes (Signed)
  Subjective:    Adriana Collins is a 64 y.o. female who presents with left foot pain.--big toe.   Onset of the symptoms was several months ago. Precipitating event: injured running away from bat-- she hyperextended toe. Current symptoms include: ability to bear weight, but with some pain and swelling. Aggravating factors: any weight bearing. Symptoms have improved but then worsened again. Patient has had no prior foot problems. Evaluation to date: none. Treatment to date: none.  The following portions of the patient's history were reviewed and updated as appropriate: allergies, current medications, past family history, past medical history, past social history, past surgical history and problem list.  Review of Systems Pertinent items are noted in HPI.    Objective:    BP 156/90 mmHg  Pulse 60  Temp(Src) 98.2 F (36.8 C) (Oral)  Wt 186 lb 3.2 oz (84.46 kg)  SpO2 100% Right foot:  normal exam, no swelling, tenderness, instability; ligaments intact, full range of motion of all ankle/foot joints  Left foot:  soft tissue swelling noted over the 1st metatarsal phlange-- L foot and tenderness   Imaging: X-ray of the left foot: not available    Assessment:    L toe pain---r/o fracture    Plan:    Rest, ice, compression, and elevation (RICE) therapy. OTC analgesics as needed. ace/ post op boot

## 2014-08-28 ENCOUNTER — Ambulatory Visit (INDEPENDENT_AMBULATORY_CARE_PROVIDER_SITE_OTHER): Admitting: Cardiology

## 2014-08-28 ENCOUNTER — Encounter: Payer: Self-pay | Admitting: Cardiology

## 2014-08-28 VITALS — BP 130/90 | HR 58 | Ht 65.0 in | Wt 186.0 lb

## 2014-08-28 DIAGNOSIS — R079 Chest pain, unspecified: Secondary | ICD-10-CM

## 2014-08-28 DIAGNOSIS — E8881 Metabolic syndrome: Secondary | ICD-10-CM

## 2014-08-28 MED ORDER — AMLODIPINE BESYLATE 5 MG PO TABS: 5.0000 mg | ORAL_TABLET | Freq: Every day | ORAL | Status: DC

## 2014-08-28 NOTE — Patient Instructions (Signed)
Your physician has recommended you make the following change in your medication:   START Amlodipine 5 mg by mouth daily  Your physician recommends that you schedule a follow-up appointment in: 1 month with Kerin Ransom PA.

## 2014-08-28 NOTE — Assessment & Plan Note (Signed)
Multiple medication intolerances

## 2014-08-28 NOTE — Assessment & Plan Note (Signed)
Hgb A1c 6.0

## 2014-08-28 NOTE — Assessment & Plan Note (Signed)
Myoview low risk 08/08/14

## 2014-08-28 NOTE — Assessment & Plan Note (Signed)
4cm on CTA 08/09/14, 3.7cm by echo 08/22/14

## 2014-08-28 NOTE — Assessment & Plan Note (Signed)
Unable to tolerate even low dose Pravachol- "joint pain"

## 2014-08-28 NOTE — Progress Notes (Signed)
08/28/2014 Adriana Collins   [birth year]  [id number]  Primary Physician Garnet Koyanagi, DO Primary Cardiologist: Dr Radford Pax (new)  HPI:  64 year old African-American female with history of HTN, HLD, family history of CAD, and metabolic syndrome, presented to [facility] with CP. She was transferred to [facility] for further evaluation on 08/08/14. She has a history of multiple medication intolerance- statins, ACE, ARB, HCTZ, and Ca++ blockers. She was admitted and ruled out for an MI. Exercise Myoview was negative for ischemia. Pravachol was added for dyslipidemia (LDL 191) but she tells me she can't tolerate this "joint pain". D-Dimer was elevated, there was no PE on chest CTA but it did show a 4cm ascending aortic aneurysm. A f/u echo 08/22/14 showed her aortic root to be 3.7 cm.            She is in the office today for follow up. She could tolerate Hydrazine "foot pain" , or Pravachol, "joint pain". Her B/P at home has been elevated- "140/90". She is watching her diet and walking daily for exercise.    Current Outpatient Prescriptions  Medication Sig Dispense Refill  . acetaminophen (TYLENOL) 325 MG tablet Take 2 tablets (650 mg total) by mouth every 4 (four) hours as needed for headache or mild pain.    Marland Kitchen ALPRAZolam (XANAX) 0.25 MG tablet Take 1 tablet (0.25 mg total) by mouth 3 (three) times daily as needed for anxiety. 20 tablet 0  . Ascorbic Acid (VITAMIN C) 100 MG tablet Take 1,000 mg by mouth daily. Take 1000mg  daily, then increases to 3000mg  daily if feeling illness    . aspirin EC 81 MG EC tablet Take 1 tablet (81 mg total) by mouth daily.    Marland Kitchen CALCIUM/MAGNESIUM/ZINC FORMULA PO Take 3,000 mg by mouth daily.    . fish oil-omega-3 fatty acids 1000 MG capsule Take 1 g by mouth daily.    Marland Kitchen GARLIC-PARSLEY PO Take 1 tablet by mouth daily.    . Ginkgo Biloba 40 MG TABS Take 40 mg by mouth daily.     Marland Kitchen HAWTHORNE BERRY PO Take 1 tablet by mouth daily.     . hydrALAZINE (APRESOLINE) 10  MG tablet Take 1 tablet (10 mg total) by mouth every 8 (eight) hours. 100 tablet 11  . MULTIPLE VITAMIN PO Take 1 tablet by mouth daily.     . Nattokinase 100 MG CAPS Take 1 capsule by mouth 2 (two) times daily.    . pravastatin (PRAVACHOL) 20 MG tablet Take 1 tablet (20 mg total) by mouth daily at 6 PM. 30 tablet 11  . Red Yeast Rice Extract (RED YEAST RICE PO) Take 650 mg by mouth 2 (two) times daily.    Marland Kitchen amLODipine (NORVASC) 5 MG tablet Take 1 tablet (5 mg total) by mouth daily. 30 tablet 11   No current facility-administered medications for this visit.    Allergies  Allergen Reactions  . Amoxil [Amoxicillin] Other (See Comments)    Loopy feeling  . Azor [Amlodipine-Olmesartan] Other (See Comments)    Pain   . Bystolic [Nebivolol Hcl] Itching  . Crestor [Rosuvastatin] Other (See Comments)    Pain   . Diltiazem Nausea And Vomiting  . Lipitor [Atorvastatin] Other (See Comments)    Pain   . Lisinopril Cough  . Prednisone Other (See Comments)    Elevated BP  . Sulfonamide Derivatives     REACTION: HIVES  . Diovan [Valsartan] Palpitations  . Hctz [Hydrochlorothiazide] Palpitations    History  Social History  . Marital Status: Married    Spouse Name: N/A    Number of Children: N/A  . Years of Education: N/A   Occupational History  . Not on file.   Social History Main Topics  . Smoking status: Never Smoker   . Smokeless tobacco: Never Used  . Alcohol Use: 0.0 oz/week    0 Not specified per week     Comment: 08/08/2014 "glass of wine 1-2 times/month"  . Drug Use: No  . Sexual Activity:    Partners: Male   Other Topics Concern  . Not on file   Social History Narrative   Married   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   Regular exercise- no     Review of Systems: General: negative for chills, fever, night sweats or weight changes.  Cardiovascular: negative for chest pain, dyspnea on exertion, edema, orthopnea, palpitations, paroxysmal nocturnal  dyspnea or shortness of breath Dermatological: negative for rash Respiratory: negative for cough or wheezing Urologic: negative for hematuria Abdominal: negative for nausea, vomiting, diarrhea, bright red blood per rectum, melena, or hematemesis Neurologic: negative for visual changes, syncope, or dizziness All other systems reviewed and are otherwise negative except as noted above.    Blood pressure 130/90, pulse 58, height 5\' 5"  (1.651 m), weight 186 lb (84.369 kg).  General appearance: alert, cooperative and no distress Lungs: clear to auscultation bilaterally Heart: regular rate and rhythm  EKG NSR SB 58  ASSESSMENT AND PLAN:   Chest pain with moderate risk for cardiac etiology Myoview low risk 08/08/14   Dilated aortic root 4cm on CTA 08/09/14, 3.7cm by echo 08/22/14   Essential hypertension Multiple medication intolerances   Hyperlipidemia with target LDL less than 70 Unable to tolerate even low dose Pravachol- "joint pain"   Metabolic syndrome Hgb X6P 6.0     PLAN  We had a long discussion about medications. She seems willing to take OTC preparations but has an aversion to prescribed medications. I explained that OTC "natural" medications may have impurities and and unknown doses of the very medications we are trying to prescribe her. She has agreed to try Amlodipine 5 mg. She will return in a month for follow up. I would try HCTZ 12.5 mg next. She says she took this for years but then it suddenly caused her to have palpitations. She may be a candidate for one of the new injectable medications for dyslipidemia but I doubt she would ever agree to taking one.   Tasha Jindra KPA-C 08/28/2014 11:59 AM

## 2014-09-21 ENCOUNTER — Ambulatory Visit (INDEPENDENT_AMBULATORY_CARE_PROVIDER_SITE_OTHER): Admitting: Psychology

## 2014-09-21 DIAGNOSIS — F410 Panic disorder [episodic paroxysmal anxiety] without agoraphobia: Secondary | ICD-10-CM

## 2014-09-26 ENCOUNTER — Encounter: Payer: Self-pay | Admitting: Cardiology

## 2014-09-26 ENCOUNTER — Ambulatory Visit (INDEPENDENT_AMBULATORY_CARE_PROVIDER_SITE_OTHER): Admitting: Cardiology

## 2014-09-26 VITALS — BP 110/72 | HR 58 | Ht 65.0 in | Wt 189.0 lb

## 2014-09-26 DIAGNOSIS — I10 Essential (primary) hypertension: Secondary | ICD-10-CM

## 2014-09-26 DIAGNOSIS — E785 Hyperlipidemia, unspecified: Secondary | ICD-10-CM

## 2014-09-26 DIAGNOSIS — E1169 Type 2 diabetes mellitus with other specified complication: Secondary | ICD-10-CM | POA: Insufficient documentation

## 2014-09-26 DIAGNOSIS — R079 Chest pain, unspecified: Secondary | ICD-10-CM

## 2014-09-26 NOTE — Assessment & Plan Note (Signed)
Myoview low risk 08/08/14

## 2014-09-26 NOTE — Progress Notes (Signed)
09/26/2014 Adriana Collins   02-Nov-1950  [id number]  Primary Physician Garnet Koyanagi, DO Primary Cardiologist: Dr Radford Pax  HPI:  Pleasant 64 year old African-American female with history of HTN, HLD, a family history of CAD, and metabolic syndrome, who presented to [facility] in Jan 2016 with CP. She was transferred to [facility] for further evaluation on. She has a history of multiple medication intolerance- statins, ACE, ARB, HCTZ, and Ca++ blockers. She was admitted and ruled out for an MI. Exercise Myoview was negative for ischemia 08/09/14. Pravachol was added for dyslipidemia (LDL 191) but she tells me she can't tolerate this "joint pain". D-Dimer was elevated, there was no PE on chest CTA but it did show a 4cm ascending aortic aneurysm. A f/u echo 08/22/14 showed her aortic root to be 3.7 cm.   She saw me in the office 08/28/14 for follow up. She could not tolerate Hydrazine "foot pain" , or Pravachol, "joint pain". Her B/P at home has been elevated- "140/90". She is watching her diet and walking daily for exercise. We had a long discussion about the importance of B/P control. She agreed to try Norvasc 5 mg. I was going to add HCTZ 12.5 mg in a week or so if we were still not at goal.            She is here today for follow up. Fortunately she is tolertating the Norvasc well and actually resumed HCTZ 12.5 mg on her own. Her B/P is 110/72.    Current Outpatient Prescriptions  Medication Sig Dispense Refill  . ALPRAZolam (XANAX) 0.25 MG tablet Take 1 tablet (0.25 mg total) by mouth 3 (three) times daily as needed for anxiety. 20 tablet 0  . amLODipine (NORVASC) 5 MG tablet Take 1 tablet (5 mg total) by mouth daily. 30 tablet 11  . Ascorbic Acid (VITAMIN C) 100 MG tablet Take 1,000 mg by mouth daily. Take 1000mg  daily, then increases to 3000mg  daily if feeling illness    . aspirin EC 81 MG EC tablet Take 1 tablet (81 mg total) by mouth daily.    Marland Kitchen CALCIUM/MAGNESIUM/ZINC FORMULA  PO Take 3,000 mg by mouth daily.    . fish oil-omega-3 fatty acids 1000 MG capsule Take 1 g by mouth daily.    Marland Kitchen GARLIC-PARSLEY PO Take 1 tablet by mouth daily.    . Ginkgo Biloba 40 MG TABS Take 40 mg by mouth daily.     Marland Kitchen HAWTHORNE BERRY PO Take 1 tablet by mouth daily.     . hydrochlorothiazide (HYDRODIURIL) 25 MG tablet Take 25 mg by mouth daily. Patient take 1/2 of the 25 mg tablet by mouth daily    . MULTIPLE VITAMIN PO Take 1 tablet by mouth daily.     . Nattokinase 100 MG CAPS Take 1 capsule by mouth 2 (two) times daily.    . Red Yeast Rice Extract (RED YEAST RICE PO) Take 650 mg by mouth 2 (two) times daily.     No current facility-administered medications for this visit.    Allergies  Allergen Reactions  . Amoxil [Amoxicillin] Other (See Comments)    Loopy feeling  . Azor [Amlodipine-Olmesartan] Other (See Comments)    Pain   . Bystolic [Nebivolol Hcl] Itching  . Crestor [Rosuvastatin] Other (See Comments)    Pain   . Diltiazem Nausea And Vomiting  . Lipitor [Atorvastatin] Other (See Comments)    Pain   . Lisinopril Cough  . Prednisone Other (See Comments)  Elevated BP  . Sulfonamide Derivatives     REACTION: HIVES  . Diovan [Valsartan] Palpitations    History   Social History  . Marital Status: Married    Spouse Name: N/A  . Number of Children: N/A  . Years of Education: N/A   Occupational History  . Not on file.   Social History Main Topics  . Smoking status: Never Smoker   . Smokeless tobacco: Never Used  . Alcohol Use: 0.0 oz/week    0 Standard drinks or equivalent per week     Comment: 08/08/2014 "glass of wine 1-2 times/month"  . Drug Use: No  . Sexual Activity:    Partners: Male   Other Topics Concern  . Not on file   Social History Narrative   Married   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   Regular exercise- no     Review of Systems: General: negative for chills, fever, night sweats or weight changes.    Cardiovascular: negative for chest pain, dyspnea on exertion, edema, orthopnea, palpitations, paroxysmal nocturnal dyspnea or shortness of breath Dermatological: negative for rash Respiratory: negative for cough or wheezing Urologic: negative for hematuria Abdominal: negative for nausea, vomiting, diarrhea, bright red blood per rectum, melena, or hematemesis Neurologic: negative for visual changes, syncope, or dizziness All other systems reviewed and are otherwise negative except as noted above.    Blood pressure 110/72, pulse 58, height 5\' 5"  (1.651 m), weight 189 lb (85.73 kg), SpO2 98 %.  General appearance: alert, cooperative and no distress Lungs: clear to auscultation bilaterally Heart: regular rate and rhythm   ASSESSMENT AND PLAN:   Chest pain with moderate risk for cardiac etiology Myoview low risk 08/08/14   Dilated aortic root 4cm on CTA 08/09/14, 3.7cm by echo 08/22/14   Essential hypertension Multiple medication intolerances but is currently tolerating Norvasc and HCTZ   Hyperlipidemia with target LDL less than 70 Unable to tolerate even low dose Pravachol- "joint pain"   Metabolic syndrome Hgb Y7C 6.0   PLAN  Same Rx. She can f/u with Dr Radford Pax in a year. She sees Dr Etter Sjogren on a regular basis and she can follow her B/P.   Kerin Ransom KPA-C 09/26/2014 2:02 PM

## 2014-09-26 NOTE — Patient Instructions (Signed)
Your physician wants you to follow-up in: 1 year with Dr. Turner. You will receive a reminder letter in the mail two months in advance. If you don't receive a letter, please call our office to schedule the follow-up appointment.  

## 2014-09-26 NOTE — Assessment & Plan Note (Signed)
Controlled on Novasc 5 mg and HCTZ 12.5 mg

## 2014-09-26 NOTE — Assessment & Plan Note (Signed)
Statin intolerant 

## 2014-10-03 ENCOUNTER — Ambulatory Visit (INDEPENDENT_AMBULATORY_CARE_PROVIDER_SITE_OTHER): Admitting: Psychology

## 2014-10-03 DIAGNOSIS — F410 Panic disorder [episodic paroxysmal anxiety] without agoraphobia: Secondary | ICD-10-CM | POA: Diagnosis not present

## 2014-10-11 ENCOUNTER — Observation Stay (HOSPITAL_BASED_OUTPATIENT_CLINIC_OR_DEPARTMENT_OTHER)
Admission: EM | Admit: 2014-10-11 | Discharge: 2014-10-12 | Disposition: A | Discharge status: Home / Self Care | Attending: General Surgery | Admitting: General Surgery

## 2014-10-11 ENCOUNTER — Emergency Department (HOSPITAL_BASED_OUTPATIENT_CLINIC_OR_DEPARTMENT_OTHER)

## 2014-10-11 ENCOUNTER — Observation Stay (HOSPITAL_COMMUNITY): Admitting: Anesthesiology

## 2014-10-11 ENCOUNTER — Encounter (HOSPITAL_BASED_OUTPATIENT_CLINIC_OR_DEPARTMENT_OTHER): Payer: Self-pay | Admitting: *Deleted

## 2014-10-11 ENCOUNTER — Encounter (HOSPITAL_COMMUNITY)
Admission: EM | Disposition: A | Discharge status: Home / Self Care | Payer: Self-pay | Source: Home / Self Care | Attending: Emergency Medicine

## 2014-10-11 DIAGNOSIS — Z7982 Long term (current) use of aspirin: Secondary | ICD-10-CM | POA: Diagnosis not present

## 2014-10-11 DIAGNOSIS — K3580 Unspecified acute appendicitis: Principal | ICD-10-CM | POA: Insufficient documentation

## 2014-10-11 DIAGNOSIS — R52 Pain, unspecified: Secondary | ICD-10-CM

## 2014-10-11 DIAGNOSIS — I10 Essential (primary) hypertension: Secondary | ICD-10-CM | POA: Diagnosis not present

## 2014-10-11 DIAGNOSIS — K353 Acute appendicitis with localized peritonitis, without perforation or gangrene: Secondary | ICD-10-CM

## 2014-10-11 DIAGNOSIS — E785 Hyperlipidemia, unspecified: Secondary | ICD-10-CM | POA: Insufficient documentation

## 2014-10-11 DIAGNOSIS — R109 Unspecified abdominal pain: Secondary | ICD-10-CM | POA: Diagnosis present

## 2014-10-11 DIAGNOSIS — F419 Anxiety disorder, unspecified: Secondary | ICD-10-CM | POA: Insufficient documentation

## 2014-10-11 DIAGNOSIS — E119 Type 2 diabetes mellitus without complications: Secondary | ICD-10-CM | POA: Insufficient documentation

## 2014-10-11 DIAGNOSIS — Z01818 Encounter for other preprocedural examination: Secondary | ICD-10-CM

## 2014-10-11 DIAGNOSIS — Z79899 Other long term (current) drug therapy: Secondary | ICD-10-CM | POA: Insufficient documentation

## 2014-10-11 HISTORY — PX: LAPAROSCOPIC APPENDECTOMY: SHX408

## 2014-10-11 LAB — URINALYSIS, ROUTINE W REFLEX MICROSCOPIC
BILIRUBIN URINE: NEGATIVE
GLUCOSE, UA: NEGATIVE mg/dL
Hgb urine dipstick: NEGATIVE
Ketones, ur: 40 mg/dL — AB
LEUKOCYTES UA: NEGATIVE
NITRITE: NEGATIVE
Protein, ur: NEGATIVE mg/dL
Specific Gravity, Urine: 1.013 (ref 1.005–1.030)
Urobilinogen, UA: 0.2 mg/dL (ref 0.0–1.0)
pH: 7 (ref 5.0–8.0)

## 2014-10-11 LAB — CBC WITH DIFFERENTIAL/PLATELET
BASOS PCT: 0 % (ref 0–1)
Basophils Absolute: 0 10*3/uL (ref 0.0–0.1)
Eosinophils Absolute: 0.1 10*3/uL (ref 0.0–0.7)
Eosinophils Relative: 1 % (ref 0–5)
HCT: 41.4 % (ref 36.0–46.0)
Hemoglobin: 14.1 g/dL (ref 12.0–15.0)
LYMPHS ABS: 1.6 10*3/uL (ref 0.7–4.0)
LYMPHS PCT: 20 % (ref 12–46)
MCH: 30.2 pg (ref 26.0–34.0)
MCHC: 34.1 g/dL (ref 30.0–36.0)
MCV: 88.7 fL (ref 78.0–100.0)
MONOS PCT: 6 % (ref 3–12)
Monocytes Absolute: 0.4 10*3/uL (ref 0.1–1.0)
Neutro Abs: 5.9 10*3/uL (ref 1.7–7.7)
Neutrophils Relative %: 73 % (ref 43–77)
Platelets: 264 10*3/uL (ref 150–400)
RBC: 4.67 MIL/uL (ref 3.87–5.11)
RDW: 13.1 % (ref 11.5–15.5)
WBC: 7.9 10*3/uL (ref 4.0–10.5)

## 2014-10-11 LAB — COMPREHENSIVE METABOLIC PANEL
ALBUMIN: 4.7 g/dL (ref 3.5–5.2)
ALT: 25 U/L (ref 0–35)
ANION GAP: 10 (ref 5–15)
AST: 28 U/L (ref 0–37)
Alkaline Phosphatase: 70 U/L (ref 39–117)
BILIRUBIN TOTAL: 0.8 mg/dL (ref 0.3–1.2)
BUN: 13 mg/dL (ref 6–23)
CO2: 26 mmol/L (ref 19–32)
Calcium: 9.3 mg/dL (ref 8.4–10.5)
Chloride: 101 mmol/L (ref 96–112)
Creatinine, Ser: 0.9 mg/dL (ref 0.50–1.10)
GFR calc Af Amer: 78 mL/min — ABNORMAL LOW (ref >90)
GFR calc non Af Amer: 67 mL/min — ABNORMAL LOW (ref >90)
Glucose, Bld: 162 mg/dL — ABNORMAL HIGH (ref 70–99)
Potassium: 3.2 mmol/L — ABNORMAL LOW (ref 3.5–5.1)
SODIUM: 137 mmol/L (ref 135–145)
TOTAL PROTEIN: 7.6 g/dL (ref 6.0–8.3)

## 2014-10-11 LAB — LIPASE, BLOOD: LIPASE: 33 U/L (ref 11–59)

## 2014-10-11 LAB — CBG MONITORING, ED: Glucose-Capillary: 159 mg/dL — ABNORMAL HIGH (ref 70–99)

## 2014-10-11 LAB — GLUCOSE, CAPILLARY: Glucose-Capillary: 124 mg/dL — ABNORMAL HIGH (ref 70–99)

## 2014-10-11 SURGERY — APPENDECTOMY, LAPAROSCOPIC
Anesthesia: General

## 2014-10-11 MED ORDER — LACTATED RINGERS IR SOLN
Status: DC | PRN
  Administered 2014-10-11: 500 mL
  Administered 2014-10-11: 1000 mL

## 2014-10-11 MED ORDER — FENTANYL CITRATE 0.05 MG/ML IJ SOLN
100.0000 ug | Freq: Once | INTRAMUSCULAR | Status: AC
  Administered 2014-10-11: 100 ug via INTRAVENOUS
  Filled 2014-10-11: qty 2

## 2014-10-11 MED ORDER — GLYCOPYRROLATE 0.2 MG/ML IJ SOLN
INTRAMUSCULAR | Status: DC | PRN
  Administered 2014-10-11: 0.4 mg via INTRAVENOUS

## 2014-10-11 MED ORDER — FENTANYL CITRATE 0.05 MG/ML IJ SOLN
50.0000 ug | Freq: Once | INTRAMUSCULAR | Status: AC
Start: 2014-10-11 — End: 2014-10-11
  Administered 2014-10-11: 50 ug via INTRAVENOUS
  Filled 2014-10-11: qty 2

## 2014-10-11 MED ORDER — ONDANSETRON HCL 4 MG/2ML IJ SOLN
INTRAMUSCULAR | Status: AC
  Filled 2014-10-11: qty 2

## 2014-10-11 MED ORDER — METRONIDAZOLE IN NACL 5-0.79 MG/ML-% IV SOLN
500.0000 mg | Freq: Three times a day (TID) | INTRAVENOUS | Status: DC
  Filled 2014-10-11: qty 100

## 2014-10-11 MED ORDER — ALPRAZOLAM 0.25 MG PO TABS: 0.2500 mg | ORAL_TABLET | Freq: Three times a day (TID) | ORAL | Status: DC | PRN

## 2014-10-11 MED ORDER — HYDROCHLOROTHIAZIDE 25 MG PO TABS
12.5000 mg | ORAL_TABLET | Freq: Every day | ORAL | Status: DC
  Filled 2014-10-11: qty 0.5

## 2014-10-11 MED ORDER — ROCURONIUM BROMIDE 100 MG/10ML IV SOLN
INTRAVENOUS | Status: DC | PRN
  Administered 2014-10-11: 20 mg via INTRAVENOUS
  Administered 2014-10-11: 10 mg via INTRAVENOUS

## 2014-10-11 MED ORDER — MIDAZOLAM HCL 2 MG/2ML IJ SOLN
INTRAMUSCULAR | Status: AC
  Filled 2014-10-11: qty 2

## 2014-10-11 MED ORDER — CIPROFLOXACIN IN D5W 400 MG/200ML IV SOLN
400.0000 mg | Freq: Two times a day (BID) | INTRAVENOUS | Status: DC
  Administered 2014-10-11: 400 mg via INTRAVENOUS
  Filled 2014-10-11 (×2): qty 200

## 2014-10-11 MED ORDER — ONDANSETRON HCL 4 MG/2ML IJ SOLN
4.0000 mg | Freq: Once | INTRAMUSCULAR | Status: AC
  Administered 2014-10-11: 4 mg via INTRAVENOUS
  Filled 2014-10-11: qty 2

## 2014-10-11 MED ORDER — HEPARIN SODIUM (PORCINE) 5000 UNIT/ML IJ SOLN
5000.0000 [IU] | Freq: Three times a day (TID) | INTRAMUSCULAR | Status: DC
  Administered 2014-10-12: 5000 [IU] via SUBCUTANEOUS
  Filled 2014-10-11 (×4): qty 1

## 2014-10-11 MED ORDER — IOHEXOL 300 MG/ML  SOLN
100.0000 mL | Freq: Once | INTRAMUSCULAR | Status: AC | PRN
  Administered 2014-10-11: 100 mL via INTRAVENOUS

## 2014-10-11 MED ORDER — HYDROMORPHONE HCL 1 MG/ML IJ SOLN
INTRAMUSCULAR | Status: AC
  Filled 2014-10-11: qty 1

## 2014-10-11 MED ORDER — DEXAMETHASONE SODIUM PHOSPHATE 10 MG/ML IJ SOLN
INTRAMUSCULAR | Status: AC
  Filled 2014-10-11: qty 1

## 2014-10-11 MED ORDER — BUPIVACAINE-EPINEPHRINE (PF) 0.25% -1:200000 IJ SOLN
INTRAMUSCULAR | Status: DC | PRN
  Administered 2014-10-11: 11 mL

## 2014-10-11 MED ORDER — NEOSTIGMINE METHYLSULFATE 10 MG/10ML IV SOLN
INTRAVENOUS | Status: AC
  Filled 2014-10-11: qty 1

## 2014-10-11 MED ORDER — ONDANSETRON HCL 4 MG/2ML IJ SOLN
INTRAMUSCULAR | Status: DC | PRN
  Administered 2014-10-11: 4 mg via INTRAVENOUS

## 2014-10-11 MED ORDER — OXYCODONE-ACETAMINOPHEN 5-325 MG PO TABS: 1.0000 | ORAL_TABLET | ORAL | Status: DC | PRN

## 2014-10-11 MED ORDER — SUCCINYLCHOLINE CHLORIDE 20 MG/ML IJ SOLN
INTRAMUSCULAR | Status: DC | PRN
  Administered 2014-10-11: 100 mg via INTRAVENOUS

## 2014-10-11 MED ORDER — DEXAMETHASONE SODIUM PHOSPHATE 10 MG/ML IJ SOLN
INTRAMUSCULAR | Status: DC | PRN
  Administered 2014-10-11: 10 mg via INTRAVENOUS

## 2014-10-11 MED ORDER — METRONIDAZOLE IN NACL 5-0.79 MG/ML-% IV SOLN
500.0000 mg | Freq: Three times a day (TID) | INTRAVENOUS | Status: DC
  Administered 2014-10-11: 500 mg via INTRAVENOUS
  Filled 2014-10-11: qty 100

## 2014-10-11 MED ORDER — SODIUM CHLORIDE 0.9 % IV SOLN
INTRAVENOUS | Status: DC
  Administered 2014-10-11: 1000 mL via INTRAVENOUS

## 2014-10-11 MED ORDER — ASPIRIN EC 81 MG PO TBEC
81.0000 mg | DELAYED_RELEASE_TABLET | Freq: Every day | ORAL | Status: DC
  Administered 2014-10-12: 81 mg via ORAL
  Filled 2014-10-11: qty 1

## 2014-10-11 MED ORDER — GLYCOPYRROLATE 0.2 MG/ML IJ SOLN
INTRAMUSCULAR | Status: AC
  Filled 2014-10-11: qty 2

## 2014-10-11 MED ORDER — DEXTROSE 5 % IV SOLN
2.0000 g | Freq: Once | INTRAVENOUS | Status: AC
  Administered 2014-10-11: 2 g via INTRAVENOUS
  Filled 2014-10-11: qty 2

## 2014-10-11 MED ORDER — LACTATED RINGERS IV SOLN
INTRAVENOUS | Status: DC
  Administered 2014-10-11: 1000 mL via INTRAVENOUS
  Administered 2014-10-11: 15:00:00 via INTRAVENOUS

## 2014-10-11 MED ORDER — BUPIVACAINE-EPINEPHRINE (PF) 0.25% -1:200000 IJ SOLN
INTRAMUSCULAR | Status: AC
  Filled 2014-10-11: qty 30

## 2014-10-11 MED ORDER — FENTANYL CITRATE 0.05 MG/ML IJ SOLN
INTRAMUSCULAR | Status: AC
  Filled 2014-10-11: qty 2

## 2014-10-11 MED ORDER — SODIUM CHLORIDE 0.9 % IV SOLN
Freq: Once | INTRAVENOUS | Status: AC
  Administered 2014-10-11: 1000 mL via INTRAVENOUS

## 2014-10-11 MED ORDER — 0.9 % SODIUM CHLORIDE (POUR BTL) OPTIME
TOPICAL | Status: DC | PRN
  Administered 2014-10-11: 1000 mL

## 2014-10-11 MED ORDER — CIPROFLOXACIN IN D5W 400 MG/200ML IV SOLN
400.0000 mg | Freq: Two times a day (BID) | INTRAVENOUS | Status: AC
  Administered 2014-10-11: 400 mg via INTRAVENOUS
  Filled 2014-10-11: qty 200

## 2014-10-11 MED ORDER — ONDANSETRON HCL 4 MG/2ML IJ SOLN: 4.0000 mg | Freq: Four times a day (QID) | INTRAMUSCULAR | Status: DC | PRN

## 2014-10-11 MED ORDER — OXYCODONE HCL 5 MG/5ML PO SOLN
5.0000 mg | Freq: Once | ORAL | Status: DC | PRN
  Filled 2014-10-11: qty 5

## 2014-10-11 MED ORDER — PROPOFOL 10 MG/ML IV BOLUS
INTRAVENOUS | Status: AC
  Filled 2014-10-11: qty 20

## 2014-10-11 MED ORDER — POTASSIUM CHLORIDE IN NACL 40-0.9 MEQ/L-% IV SOLN
INTRAVENOUS | Status: DC
  Administered 2014-10-11: 75 mL/h via INTRAVENOUS
  Filled 2014-10-11: qty 1000

## 2014-10-11 MED ORDER — PROPOFOL 10 MG/ML IV BOLUS
INTRAVENOUS | Status: DC | PRN
  Administered 2014-10-11: 150 mg via INTRAVENOUS

## 2014-10-11 MED ORDER — PROMETHAZINE HCL 25 MG/ML IJ SOLN: 6.2500 mg | INTRAMUSCULAR | Status: DC | PRN

## 2014-10-11 MED ORDER — NEOSTIGMINE METHYLSULFATE 10 MG/10ML IV SOLN
INTRAVENOUS | Status: DC | PRN
  Administered 2014-10-11: 3 mg via INTRAVENOUS

## 2014-10-11 MED ORDER — MORPHINE SULFATE 4 MG/ML IJ SOLN
4.0000 mg | Freq: Once | INTRAMUSCULAR | Status: AC
Start: 2014-10-11 — End: 2014-10-11
  Administered 2014-10-11: 4 mg via INTRAVENOUS
  Filled 2014-10-11: qty 1

## 2014-10-11 MED ORDER — MORPHINE SULFATE 2 MG/ML IJ SOLN
1.0000 mg | INTRAMUSCULAR | Status: DC | PRN
Start: 2014-10-11 — End: 2014-10-12
  Administered 2014-10-11: 2 mg via INTRAVENOUS
  Filled 2014-10-11: qty 1

## 2014-10-11 MED ORDER — FENTANYL CITRATE 0.05 MG/ML IJ SOLN
INTRAMUSCULAR | Status: DC | PRN
  Administered 2014-10-11: 50 ug via INTRAVENOUS
  Administered 2014-10-11: 100 ug via INTRAVENOUS
  Administered 2014-10-11: 50 ug via INTRAVENOUS

## 2014-10-11 MED ORDER — MIDAZOLAM HCL 5 MG/5ML IJ SOLN
INTRAMUSCULAR | Status: DC | PRN
  Administered 2014-10-11: 2 mg via INTRAVENOUS

## 2014-10-11 MED ORDER — POTASSIUM CHLORIDE IN NACL 40-0.9 MEQ/L-% IV SOLN
INTRAVENOUS | Status: DC
  Administered 2014-10-11: 125 mL/h via INTRAVENOUS
  Filled 2014-10-11 (×2): qty 1000

## 2014-10-11 MED ORDER — OXYCODONE HCL 5 MG PO TABS: 5.0000 mg | ORAL_TABLET | Freq: Once | ORAL | Status: DC | PRN

## 2014-10-11 MED ORDER — AMLODIPINE BESYLATE 5 MG PO TABS
5.0000 mg | ORAL_TABLET | Freq: Every day | ORAL | Status: DC
  Administered 2014-10-11 – 2014-10-12 (×2): 5 mg via ORAL
  Filled 2014-10-11 (×2): qty 1

## 2014-10-11 MED ORDER — HYDROMORPHONE HCL 1 MG/ML IJ SOLN
0.2500 mg | INTRAMUSCULAR | Status: DC | PRN
  Administered 2014-10-11: 0.5 mg via INTRAVENOUS

## 2014-10-11 MED ORDER — IOHEXOL 300 MG/ML  SOLN
25.0000 mL | Freq: Once | INTRAMUSCULAR | Status: AC | PRN
  Administered 2014-10-11: 25 mL via ORAL

## 2014-10-11 MED ORDER — LIDOCAINE HCL (PF) 2 % IJ SOLN
INTRAMUSCULAR | Status: DC | PRN
  Administered 2014-10-11: 20 mg via INTRADERMAL

## 2014-10-11 SURGICAL SUPPLY — 32 items
APPLIER CLIP 5 13 M/L LIGAMAX5 (MISCELLANEOUS)
APPLIER CLIP ROT 10 11.4 M/L (STAPLE)
APR CLP MED LRG 11.4X10 (STAPLE)
APR CLP MED LRG 5 ANG JAW (MISCELLANEOUS)
BAG SPEC RTRVL LRG 6X4 10 (ENDOMECHANICALS) ×1
CHLORAPREP W/TINT 26ML (MISCELLANEOUS) ×2 IMPLANT
CLIP APPLIE 5 13 M/L LIGAMAX5 (MISCELLANEOUS) IMPLANT
CLIP APPLIE ROT 10 11.4 M/L (STAPLE) IMPLANT
CUTTER FLEX LINEAR 45M (STAPLE) ×1 IMPLANT
DECANTER SPIKE VIAL GLASS SM (MISCELLANEOUS) ×2 IMPLANT
DRAPE LAPAROSCOPIC ABDOMINAL (DRAPES) ×2 IMPLANT
ELECT REM PT RETURN 9FT ADLT (ELECTROSURGICAL) ×2
ELECTRODE REM PT RTRN 9FT ADLT (ELECTROSURGICAL) ×1 IMPLANT
GLOVE ECLIPSE 7.5 STRL STRAW (GLOVE) ×2 IMPLANT
GOWN STRL REUS W/TWL XL LVL3 (GOWN DISPOSABLE) ×4 IMPLANT
KIT BASIN OR (CUSTOM PROCEDURE TRAY) ×2 IMPLANT
LIQUID BAND (GAUZE/BANDAGES/DRESSINGS) IMPLANT
PENCIL BUTTON HOLSTER BLD 10FT (ELECTRODE) ×2 IMPLANT
POUCH SPECIMEN RETRIEVAL 10MM (ENDOMECHANICALS) ×2 IMPLANT
RELOAD 45 VASCULAR/THIN (ENDOMECHANICALS) IMPLANT
RELOAD STAPLE 45 2.5 WHT GRN (ENDOMECHANICALS) IMPLANT
RELOAD STAPLE 45 3.5 BLU ETS (ENDOMECHANICALS) IMPLANT
RELOAD STAPLE TA45 3.5 REG BLU (ENDOMECHANICALS) ×2 IMPLANT
SET IRRIG TUBING LAPAROSCOPIC (IRRIGATION / IRRIGATOR) ×2 IMPLANT
SHEARS HARMONIC ACE PLUS 36CM (ENDOMECHANICALS) ×2 IMPLANT
SUT MNCRL AB 4-0 PS2 18 (SUTURE) ×2 IMPLANT
TOWEL OR 17X26 10 PK STRL BLUE (TOWEL DISPOSABLE) ×2 IMPLANT
TRAY FOLEY CATH 14FRSI W/METER (CATHETERS) ×2 IMPLANT
TRAY LAPAROSCOPIC (CUSTOM PROCEDURE TRAY) ×2 IMPLANT
TROCAR BLADELESS OPT 5 100 (ENDOMECHANICALS) ×2 IMPLANT
TROCAR XCEL 12X100 BLDLESS (ENDOMECHANICALS) ×2 IMPLANT
TROCAR XCEL BLUNT TIP 100MML (ENDOMECHANICALS) ×2 IMPLANT

## 2014-10-11 NOTE — ED Notes (Signed)
Bed: WA04 Expected date:  Expected time:  Means of arrival:  Comments: Tx [facility]acute appy

## 2014-10-11 NOTE — ED Notes (Signed)
C/o abd pain x 12 hours w nausea  Vomited x 3   Denies urinary sx

## 2014-10-11 NOTE — Op Note (Signed)
Preoperative Diagnosis: Acute appendicitis without peritonitis [K35.80]   Postoprative Diagnosis: Acute appendicitis without peritonitis [K35.80]   Procedure: Procedure(s): APPENDECTOMY LAPAROSCOPIC   Surgeon: Excell Seltzer T   Assistants: None  Anesthesia:  General endotracheal anesthesia  Indications: Patient is a 64 year old female who presents with about 24 hours of somewhat vague diffuse abdominal pain and nausea and vomiting. Workup has included a CT scan of the abdomen showing evidence of acute appendicitis with appendicolith and no evidence of perforation or abscess. I discussed options for treatment with the patient and risks of appendectomy including anesthetic complications, bleeding, infection or possible need for open procedure. She is agreeable to proceeding with laparoscopic appendectomy.    Procedure Detail:  The patient received broad-spectrum preoperative IV antibiotics. She is taken to the operating room and placed in the supine position on the operating table and general anesthesia induced. Foley catheter was placed. The abdomen was widely sterilely prepped and draped. PAS were in place. Patient timeout was performed and correct procedure verified. Access was obtained with a 5 mm Optiview trocar in the right upper quadrant without difficulty and pneumoperitoneum established. There was no evidence of trocar injury. There were noted be some omental adhesions around the umbilicus. A 12 mm trocar was placed in the left lower quadrant under direct vision. Omental adhesions were taken down with the Harmonic scalpel and a 5 mm trocar was placed at the umbilicus. The appendix was exposed just beneath the cecum and was distended and acutely inflamed with early gangrenous changes but no perforation. The appendix was elevated and initially mobilized dividing lateral peritoneal attachments. The mesoappendix was then sequentially divided with the Harmonic scalpel working down toward  the base of the appendix until the appendix was completely freed to the tip of the cecum. The very base of the appendix was relatively uninflamed. The appendix was then divided from the cecum across the tip of the cecum with a single firing of the Endo GIA 45 mm blue load stapler. The staple line was intact and without bleeding. The appendix was placed in an Endo Catch bag. The abdomen was thoroughly irrigated. Hemostasis was assured. The appendix was then brought out through the left lower quadrant incision. There was no evidence of bleeding or other problems. Trochars were removed and all CO2 evacuated. Skin incisions were closed with subcuticular Monocryl and Dermabond. Sponge needle and instrument counts were correct.    Findings: Acute appendicitis with early gangrenous changes but no perforation or abscess  Estimated Blood Loss:  Minimal         Drains: nnone  Blood Given: none          Specimens: Appendix        Complications:  * No complications entered in OR log *         Disposition: PACU - hemodynamically stable.         Condition: stable

## 2014-10-11 NOTE — Transfer of Care (Signed)
Immediate Anesthesia Transfer of Care Note  Patient: Adriana Collins  Procedure(s) Performed: Procedure(s): APPENDECTOMY LAPAROSCOPIC (N/A)  Patient Location: PACU  Anesthesia Type:General  Level of Consciousness: awake, alert  and oriented  Airway & Oxygen Therapy: Patient Spontanous Breathing and Patient connected to face mask oxygen  Post-op Assessment: Report given to RN and Post -op Vital signs reviewed and stable  Post vital signs: Reviewed and stable  Last Vitals:  Filed Vitals:   10/11/14 1219  BP: 141/86  Pulse: 70  Temp: 37.1 C  Resp: 18    Complications: No apparent anesthesia complications

## 2014-10-11 NOTE — ED Notes (Signed)
Surgery in to see patient.

## 2014-10-11 NOTE — ED Notes (Signed)
Pt reports abdominal pain since 130pm - also c/o nausea, vomiting.

## 2014-10-11 NOTE — Progress Notes (Signed)
CBG 124 
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
## 2014-10-11 NOTE — Anesthesia Preprocedure Evaluation (Signed)
Anesthesia Evaluation  Patient identified by MRN, date of birth, ID band Patient awake    Airway    Neck ROM: Full    Dental  (+) Teeth Intact   Pulmonary neg pulmonary ROS,          Cardiovascular hypertension, + angina Rhythm:Regular Rate:Normal     Neuro/Psych    GI/Hepatic negative GI ROS, hiatal hernia,   Endo/Other  diabetes  Renal/GU      Musculoskeletal   Abdominal   Peds  Hematology   Anesthesia Other Findings   Reproductive/Obstetrics                             Anesthesia Physical Anesthesia Plan  ASA: III  Anesthesia Plan: General   Post-op Pain Management:    Induction: Intravenous  Airway Management Planned: Oral ETT  Additional Equipment:   Intra-op Plan:   Post-operative Plan: Extubation in OR  Informed Consent:   Plan Discussed with:   Anesthesia Plan Comments:         Anesthesia Quick Evaluation

## 2014-10-11 NOTE — ED Notes (Signed)
Pt reports via [facility] from [place]. Pt went for abdominal pain, CT confirmed appendicitis.

## 2014-10-11 NOTE — Anesthesia Postprocedure Evaluation (Signed)
  Anesthesia Post-op Note  Patient: Adriana Collins  Procedure(s) Performed: Procedure(s): APPENDECTOMY LAPAROSCOPIC (N/A)  Patient Location: PACU  Anesthesia Type:General  Level of Consciousness: awake and alert   Airway and Oxygen Therapy: Patient Spontanous Breathing  Post-op Pain: mild  Post-op Assessment: Post-op Vital signs reviewed  Post-op Vital Signs: stable  Last Vitals:  Filed Vitals:   10/11/14 1545  BP:   Pulse:   Temp: 37.1 C  Resp:     Complications: No apparent anesthesia complications

## 2014-10-11 NOTE — Anesthesia Procedure Notes (Signed)
Procedure Name: Intubation Performed by: Noralyn Pick D Pre-anesthesia Checklist: Patient identified, Emergency Drugs available, Suction available and Patient being monitored Patient Re-evaluated:Patient Re-evaluated prior to inductionOxygen Delivery Method: Circle System Utilized Preoxygenation: Pre-oxygenation with 100% oxygen Intubation Type: IV induction Ventilation: Mask ventilation without difficulty Laryngoscope Size: Miller and 2 Tube type: Oral Tube size: 7.0 mm Number of attempts: 1 Airway Equipment and Method: Stylet and Oral airway Placement Confirmation: ETT inserted through vocal cords under direct vision,  positive ETCO2 and breath sounds checked- equal and bilateral Secured at: 21 cm Tube secured with: Tape Dental Injury: Teeth and Oropharynx as per pre-operative assessment

## 2014-10-11 NOTE — Progress Notes (Signed)
Given to [place], [place] room [phone]
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
## 2014-10-11 NOTE — ED Provider Notes (Signed)
  Physical Exam  BP 136/82 mmHg  Pulse 67  Temp(Src) 98.1 F (36.7 C) (Oral)  Resp 24  Ht 5\' 5"  (1.651 m)  Wt 185 lb (83.915 kg)  BMI 30.79 kg/m2  SpO2 98%  Patient transferred from [facility] by Dr. Florina Ou for acute appendicitis. Onset of diffuse, crampy, abdominal pain yesterday at midday. Worsening  With associated, N/V. No fever. Describes the pain as diffuse and  Physical Exam Berry uncomfortable. Heart rate normal with regular rate and rhythm, lungs clear to auscultation bilaterally. She has diffuse abdominal tenderness which is worse in the right lower quadrant. ED Course  Procedures  MDM Dr. Florina Ou spoken with surgery who is on their way to see the patient. Every order pain medications and antinausea medicines. She appears stable      Margarita Mail, PA-C 09/47/09 [id number]  Delora Fuel, MD 66/29/47 6546

## 2014-10-12 ENCOUNTER — Encounter (HOSPITAL_COMMUNITY): Payer: Self-pay | Admitting: General Surgery

## 2014-10-12 LAB — BASIC METABOLIC PANEL
Anion gap: 9 (ref 5–15)
BUN: 10 mg/dL (ref 6–23)
CALCIUM: 8.5 mg/dL (ref 8.4–10.5)
CO2: 26 mmol/L (ref 19–32)
CREATININE: 0.76 mg/dL (ref 0.50–1.10)
Chloride: 106 mmol/L (ref 96–112)
GFR calc Af Amer: >90 mL/min (ref >90)
GFR calc non Af Amer: 89 mL/min — ABNORMAL LOW (ref >90)
GLUCOSE: 131 mg/dL — AB (ref 70–99)
Potassium: 4 mmol/L (ref 3.5–5.1)
Sodium: 141 mmol/L (ref 135–145)

## 2014-10-12 LAB — CBC
HEMATOCRIT: 36.3 % (ref 36.0–46.0)
HEMOGLOBIN: 12 g/dL (ref 12.0–15.0)
MCH: 29.9 pg (ref 26.0–34.0)
MCHC: 33.1 g/dL (ref 30.0–36.0)
MCV: 90.3 fL (ref 78.0–100.0)
Platelets: 246 10*3/uL (ref 150–400)
RBC: 4.02 MIL/uL (ref 3.87–5.11)
RDW: 13.5 % (ref 11.5–15.5)
WBC: 11.4 10*3/uL — ABNORMAL HIGH (ref 4.0–10.5)

## 2014-10-12 MED ORDER — HYDROCODONE-ACETAMINOPHEN 5-325 MG PO TABS: 1.0000 | ORAL_TABLET | ORAL | Status: DC | PRN

## 2014-10-12 MED ORDER — HYDROCHLOROTHIAZIDE 12.5 MG PO CAPS
12.5000 mg | ORAL_CAPSULE | Freq: Every day | ORAL | Status: DC
  Administered 2014-10-12: 12.5 mg via ORAL
  Filled 2014-10-12: qty 1

## 2014-10-12 NOTE — Progress Notes (Signed)
UR completed 

## 2014-10-12 NOTE — Discharge Instructions (Signed)
[facility] ______[facility] °LAPAROSCOPIC SURGERY: POST OP INSTRUCTIONS °Always review your discharge instruction sheet given to you by the facility where your surgery was performed. °IF YOU HAVE DISABILITY OR FAMILY LEAVE FORMS, YOU MUST BRING THEM TO THE OFFICE FOR PROCESSING.   °DO NOT GIVE THEM TO YOUR DOCTOR. ° °1. A prescription for pain medication may be given to you upon discharge.  Take your pain medication as prescribed, if needed.  If narcotic pain medicine is not needed, then you may take acetaminophen (Tylenol) or ibuprofen (Advil) as needed. °2. Take your usually prescribed medications unless otherwise directed. °3. If you need a refill on your pain medication, please contact your pharmacy.  They will contact our office to request authorization. Prescriptions will not be filled after 5pm or on week-ends. °4. You should follow a light diet the first few days after arrival home, such as soup and crackers, etc.  Be sure to include lots of fluids daily. °5. Most patients will experience some swelling and bruising in the area of the incisions.  Ice packs will help.  Swelling and bruising can take several days to resolve.  °6. It is common to experience some constipation if taking pain medication after surgery.  Increasing fluid intake and taking a stool softener (such as Colace) will usually help or prevent this problem from occurring.  A mild laxative (Milk of Magnesia or Miralax) should be taken according to package instructions if there are no bowel movements after 48 hours. °7. Unless discharge instructions indicate otherwise, you may remove your bandages 24-48 hours after surgery, and you may shower at that time.  You may have steri-strips (small skin tapes) in place directly over the incision.  These strips should be left on the skin for 7-10 days.  If your surgeon used skin glue on the incision, you may shower in 24 hours.  The glue will flake off over the next 2-3 weeks.  Any sutures or  staples will be removed at the office during your follow-up visit. °8. ACTIVITIES:  You may resume regular (light) daily activities beginning the next day--such as daily self-care, walking, climbing stairs--gradually increasing activities as tolerated.  You may have sexual intercourse when it is comfortable.  Refrain from any heavy lifting or straining until approved by your doctor. °a. You may drive when you are no longer taking prescription pain medication, you can comfortably wear a seatbelt, and you can safely maneuver your car and apply brakes. °b. RETURN TO WORK:  __________________________________________________________ °9. You should see your doctor in the office for a follow-up appointment approximately 2-3 weeks after your surgery.  Make sure that you call for this appointment within a day or two after you arrive home to insure a convenient appointment time. °10. OTHER INSTRUCTIONS: __________________________________________________________________________________________________________________________ __________________________________________________________________________________________________________________________ °WHEN TO CALL YOUR DOCTOR: °1. Fever over 101.0 °2. Inability to urinate °3. Continued bleeding from incision. °4. Increased pain, redness, or drainage from the incision. °5. Increasing abdominal pain ° °The clinic staff is available to answer your questions during regular business hours.  Please don’t hesitate to call and ask to speak to one of the nurses for clinical concerns.  If you have a medical emergency, go to the nearest emergency room or call 911.  A surgeon from [facility] is always on call at the hospital. °[address], [address], [place], [place]  [address] ? [address] [address], [place],    [address] °[phone] ? [phone] ? FAX [phone] °Web site:   [email or web address] ______[facility] LAPAROSCOPIC  SURGERY: POST OP INSTRUCTIONS Always review your discharge instruction sheet given to you by the facility where your surgery was performed. IF YOU HAVE DISABILITY OR FAMILY LEAVE FORMS, YOU MUST BRING THEM TO THE OFFICE FOR PROCESSING.   DO NOT GIVE THEM TO YOUR DOCTOR.  11. A prescription for pain medication may be given to you upon discharge.  Take your pain medication as prescribed, if needed.  If narcotic pain medicine is not needed, then you may take acetaminophen (Tylenol) or ibuprofen (Advil) as needed. 12. Take your usually prescribed medications unless otherwise directed. 13. If you need a refill on your pain medication, please contact your pharmacy.  They will contact our office to request authorization. Prescriptions will not be filled after 5pm or on week-ends. 14. You should follow a light diet the first few days after arrival home, such as soup and crackers, etc.  Be sure to include lots of fluids daily. 15. Most patients will experience some swelling and bruising in the area of the incisions.  Ice packs will help.  Swelling and bruising can take several days to resolve.  16. It is common to experience some constipation if taking pain medication after surgery.  Increasing fluid intake and taking a stool softener (such as Colace) will usually help or prevent this problem from occurring.  A mild laxative (Milk of Magnesia or Miralax) should be taken according to package instructions if there are no bowel movements after 48 hours. 17. Unless discharge instructions indicate otherwise, you may remove your bandages 24-48 hours after surgery, and you may shower at that time.  You may have steri-strips (small skin tapes) in place directly over the incision.  These strips should be left on the skin for 7-10 days.  If your surgeon used skin glue on the incision, you may shower in 24 hours.  The glue will flake off over the next 2-3 weeks.  Any sutures or staples will be removed at the office during  your follow-up visit. 18. ACTIVITIES:  You may resume regular (light) daily activities beginning the next day--such as daily self-care, walking, climbing stairs--gradually increasing activities as tolerated.  You may have sexual intercourse when it is comfortable.  Refrain from any heavy lifting or straining until approved by your doctor. a. You may drive when you are no longer taking prescription pain medication, you can comfortably wear a seatbelt, and you can safely maneuver your car and apply brakes. b. RETURN TO WORK:  __________________________________________________________ 19. You should see your doctor in the office for a follow-up appointment approximately 2-3 weeks after your surgery.  Make sure that you call for this appointment within a day or two after you arrive home to insure a convenient appointment time. 20. OTHER INSTRUCTIONS: __________________________________________________________________________________________________________________________ __________________________________________________________________________________________________________________________ WHEN TO CALL YOUR DOCTOR: 6. Fever over 101.0 7. Inability to urinate 8. Continued bleeding from incision. 9. Increased pain, redness, or drainage from the incision. 10. Increasing abdominal pain  The clinic staff is available to answer your questions during regular business hours.  Please dont hesitate to call and ask to speak to one of the nurses for clinical concerns.  If you have a medical emergency, go to the nearest emergency room or call 911.  A surgeon from [facility] is always on call at the hospital. [address], [place], [place], [place]  [address] ? [address], [place], [place]   [address] [phone] ? [phone] ? FAX [phone] Web  site: [email or web address]  

## 2014-10-12 NOTE — Discharge Summary (Signed)
   Patient ID: Adriana Collins [id number] 64 y.o. 20-Aug-1950  10/11/2014  Discharge date and time: 10/12/2014   Admitting Physician: Excell Seltzer T  Discharge Physician: Excell Seltzer T  Admission Diagnoses: Acute appendicitis    Discharge Diagnoses: Same  Operations: Procedure(s): APPENDECTOMY LAPAROSCOPIC  Admission Condition: fair  Discharged Condition: good  Indication for Admission: Patient is a 64 year old female who presents with approximately 24 hours of diffuse lower abdominal pain and nausea and vomiting. CT scan has shown evidence of acute appendicitis with appendicoliths.   Hospital Course: Patient was treated with broad-spectrum IV antibiotics and taken to the operating room for laparoscopic appendectomy. She had significant appendicitis with early gangrenous changes but no perforation. The following morning she feels well. She denies any pain or nausea. Tolerating diet. She is afebrile with normal vital signs. Abdomen is soft and nontender. Wounds are clean. She is felt ready for discharge.   Disposition: Home  Patient Instructions:    Medication List    TAKE these medications        ALPRAZolam 0.25 MG tablet  Commonly known as:  XANAX  Take 1 tablet (0.25 mg total) by mouth 3 (three) times daily as needed for anxiety.     amLODipine 5 MG tablet  Commonly known as:  NORVASC  Take 1 tablet (5 mg total) by mouth daily.     aspirin 81 MG EC tablet  Take 1 tablet (81 mg total) by mouth daily.     CALCIUM/MAGNESIUM/ZINC FORMULA PO  Take 3,000 mg by mouth daily.     fish oil-omega-3 fatty acids 1000 MG capsule  Take 1 g by mouth daily.     GARLIC-PARSLEY PO  Take 1 tablet by mouth daily.     Ginkgo Biloba 40 MG Tabs  Take 40 mg by mouth daily.     HAWTHORNE BERRY PO  Take 1 tablet by mouth daily.     hydrochlorothiazide 25 MG tablet  Commonly known as:  HYDRODIURIL  Take 12.5 mg by mouth daily.     HYDROcodone-acetaminophen 5-325  MG per tablet  Commonly known as:  NORCO/VICODIN  Take 1-2 tablets by mouth every 4 (four) hours as needed for moderate pain.     MULTIPLE VITAMIN PO  Take 1 tablet by mouth daily.     Nattokinase 100 MG Caps  Take 1 capsule by mouth 2 (two) times daily.     RED YEAST RICE PO  Take 650 mg by mouth 2 (two) times daily.     vitamin C 100 MG tablet  Take 1,000 mg by mouth daily. Take 1000mg  daily, then increases to 3000mg  daily if feeling illness        Activity: activity as tolerated Diet: regular diet Wound Care: none needed  Follow-up:  With Dr. Excell Seltzer in 2 weeks.  Signed: Edward Jolly MD, FACS  10/12/2014, 8:08 AM

## 2014-10-22 ENCOUNTER — Ambulatory Visit (INDEPENDENT_AMBULATORY_CARE_PROVIDER_SITE_OTHER): Admitting: Psychology

## 2014-10-22 DIAGNOSIS — F410 Panic disorder [episodic paroxysmal anxiety] without agoraphobia: Secondary | ICD-10-CM

## 2014-11-07 ENCOUNTER — Ambulatory Visit (INDEPENDENT_AMBULATORY_CARE_PROVIDER_SITE_OTHER): Admitting: Psychology

## 2014-11-07 DIAGNOSIS — F410 Panic disorder [episodic paroxysmal anxiety] without agoraphobia: Secondary | ICD-10-CM

## 2014-11-21 ENCOUNTER — Ambulatory Visit (INDEPENDENT_AMBULATORY_CARE_PROVIDER_SITE_OTHER): Admitting: Psychology

## 2014-11-21 DIAGNOSIS — F410 Panic disorder [episodic paroxysmal anxiety] without agoraphobia: Secondary | ICD-10-CM

## 2014-12-04 ENCOUNTER — Encounter: Payer: Self-pay | Admitting: Family Medicine

## 2014-12-25 ENCOUNTER — Encounter: Payer: Self-pay | Admitting: Family Medicine

## 2014-12-25 ENCOUNTER — Ambulatory Visit (INDEPENDENT_AMBULATORY_CARE_PROVIDER_SITE_OTHER): Admitting: Family Medicine

## 2014-12-25 VITALS — BP 126/82 | HR 59 | Temp 98.2°F | Ht 65.0 in | Wt 188.2 lb

## 2014-12-25 DIAGNOSIS — J209 Acute bronchitis, unspecified: Secondary | ICD-10-CM

## 2014-12-25 DIAGNOSIS — J0110 Acute frontal sinusitis, unspecified: Secondary | ICD-10-CM | POA: Diagnosis not present

## 2014-12-25 MED ORDER — AZITHROMYCIN 250 MG PO TABS: ORAL_TABLET | ORAL | Status: DC

## 2014-12-25 MED ORDER — FLUTICASONE PROPIONATE 50 MCG/ACT NA SUSP
2.0000 | Freq: Every day | NASAL | Status: DC
Start: 2014-12-25 — End: 2015-08-16

## 2014-12-25 MED ORDER — PROMETHAZINE-DM 6.25-15 MG/5ML PO SYRP: 5.0000 mL | ORAL_SOLUTION | Freq: Four times a day (QID) | ORAL | Status: DC | PRN
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
## 2014-12-25 NOTE — Patient Instructions (Signed)

## 2014-12-25 NOTE — Progress Notes (Signed)
Pre visit review using our clinic review tool, if applicable. No additional management support is needed unless otherwise documented below in the visit note. 

## 2015-01-03 ENCOUNTER — Telehealth: Payer: Self-pay | Admitting: Family Medicine

## 2015-01-03 MED ORDER — LEVOFLOXACIN 500 MG PO TABS
500.0000 mg | ORAL_TABLET | Freq: Every day | ORAL | Status: DC
Start: 2015-01-03 — End: 2015-04-08

## 2015-01-03 NOTE — Telephone Encounter (Signed)
levaquin 500 mg 1 po qd x 7 days  

## 2015-01-03 NOTE — Telephone Encounter (Signed)
12/25/14 Sinusitis treated with Z-pak and no relief, still having cough and congestion. Please advise    KP

## 2015-01-03 NOTE — Telephone Encounter (Signed)
°  Relation to pt: self  Call back number:  [phone]  Pharmacy: [facility], [address]  Reason for call:   Pt was last seen 12/25/14 and states she is experiencing chest congestion and requesting another RX. Please advise

## 2015-01-03 NOTE — Telephone Encounter (Signed)
MSG left to call the office      KP 

## 2015-01-08 ENCOUNTER — Ambulatory Visit (INDEPENDENT_AMBULATORY_CARE_PROVIDER_SITE_OTHER): Admitting: Physician Assistant

## 2015-01-08 ENCOUNTER — Encounter: Payer: Self-pay | Admitting: Physician Assistant

## 2015-01-08 VITALS — BP 120/77 | HR 68 | Temp 98.4°F | Resp 16 | Ht 65.0 in | Wt 186.4 lb

## 2015-01-08 DIAGNOSIS — L200 Besnier's prurigo: Secondary | ICD-10-CM | POA: Diagnosis not present

## 2015-01-08 DIAGNOSIS — L239 Allergic contact dermatitis, unspecified cause: Secondary | ICD-10-CM | POA: Insufficient documentation

## 2015-01-08 MED ORDER — TRIAMCINOLONE ACETONIDE 0.025 % EX OINT: 1.0000 "application " | TOPICAL_OINTMENT | Freq: Two times a day (BID) | CUTANEOUS | Status: DC
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
## 2015-01-08 NOTE — Progress Notes (Signed)
Pre visit review using our clinic review tool, if applicable. No additional management support is needed unless otherwise documented below in the visit note/SLS  

## 2015-01-08 NOTE — Assessment & Plan Note (Signed)
Mild response to the cause of superficial laceration. No evidence of cellulitis. Rx Kenalog ointment. Supportive measures reviewed.  Follow-up PRN.

## 2015-01-08 NOTE — Patient Instructions (Signed)
Please keep area clean and dry. Apply steroid cream to the area twice daily. Use claritin in the morning for itch.  Can also use Benadryl at bedtime. Cool compresses will also be beneficial. Keep arms covered when working in the garden. If anything worsens or new symptoms develop, come see me.

## 2015-02-14 ENCOUNTER — Encounter: Payer: Self-pay | Admitting: Gastroenterology

## 2015-04-08 ENCOUNTER — Ambulatory Visit (INDEPENDENT_AMBULATORY_CARE_PROVIDER_SITE_OTHER): Admitting: Family Medicine

## 2015-04-08 ENCOUNTER — Encounter: Payer: Self-pay | Admitting: Family Medicine

## 2015-04-08 VITALS — BP 118/74 | HR 59 | Temp 98.7°F | Ht 65.0 in | Wt 190.0 lb

## 2015-04-08 DIAGNOSIS — R300 Dysuria: Secondary | ICD-10-CM

## 2015-04-08 DIAGNOSIS — N390 Urinary tract infection, site not specified: Secondary | ICD-10-CM | POA: Diagnosis not present

## 2015-04-08 DIAGNOSIS — Z0000 Encounter for general adult medical examination without abnormal findings: Secondary | ICD-10-CM

## 2015-04-08 LAB — POCT URINALYSIS DIPSTICK
BILIRUBIN UA: NEGATIVE
GLUCOSE UA: NEGATIVE
KETONES UA: NEGATIVE
Leukocytes, UA: NEGATIVE
NITRITE UA: NEGATIVE
Protein, UA: NEGATIVE
Spec Grav, UA: 1.02
Urobilinogen, UA: 0.2
pH, UA: 6

## 2015-04-08 MED ORDER — CIPROFLOXACIN HCL 250 MG PO TABS: 250.0000 mg | ORAL_TABLET | Freq: Two times a day (BID) | ORAL | Status: DC

## 2015-04-08 NOTE — Progress Notes (Signed)
Pre visit review using our clinic review tool, if applicable. No additional management support is needed unless otherwise documented below in the visit note. 
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
## 2015-04-08 NOTE — Patient Instructions (Signed)

## 2015-04-08 NOTE — Assessment & Plan Note (Signed)
Culture pending cipro x 3 days  rto prn

## 2015-04-09 LAB — URINE CULTURE

## 2015-04-26 ENCOUNTER — Encounter: Payer: Self-pay | Admitting: Family Medicine

## 2015-04-26 ENCOUNTER — Ambulatory Visit (INDEPENDENT_AMBULATORY_CARE_PROVIDER_SITE_OTHER): Admitting: Family Medicine

## 2015-04-26 VITALS — BP 99/68 | HR 59 | Temp 98.4°F | Resp 16 | Ht 65.0 in | Wt 185.0 lb

## 2015-04-26 DIAGNOSIS — J018 Other acute sinusitis: Secondary | ICD-10-CM | POA: Diagnosis not present

## 2015-04-26 MED ORDER — AZITHROMYCIN 250 MG PO TABS: ORAL_TABLET | ORAL | Status: DC

## 2015-04-26 NOTE — Progress Notes (Signed)
OFFICE NOTE  04/26/2015  CC:  Chief Complaint  Patient presents with  . Sinus Problem    right side of face x 2 weeks   HPI: Patient is a 64 y.o.  female who is here for "sinus pressure". Onset about 2 weeks ago, nasal congestion, lately worsening pressure in face and L ear region, some dry cough started last night.  Some "flashes" of subjective fevers last couple days. Left nostril stopped up, R nostril has some clear drainage.  No ST. Saline nasal spray has been tried.  Also tried colloidal silver nasal spray. Nonsmoker.  No SOB or wheezing.   Pertinent PMH:  PMH and PSH reviewed  MEDS:  Outpatient Prescriptions Prior to Visit  Medication Sig Dispense Refill  . ALPRAZolam (XANAX) 0.25 MG tablet Take 1 tablet (0.25 mg total) by mouth 3 (three) times daily as needed for anxiety. 20 tablet 0  . amLODipine (NORVASC) 5 MG tablet Take 1 tablet (5 mg total) by mouth daily. 30 tablet 11  . Ascorbic Acid (VITAMIN C) 100 MG tablet Take 1,000 mg by mouth daily. Take 1000mg  daily, then increases to 3000mg  daily if feeling illness    . aspirin EC 81 MG EC tablet Take 1 tablet (81 mg total) by mouth daily.    Marland Kitchen CALCIUM/MAGNESIUM/ZINC FORMULA PO Take 3,000 mg by mouth daily.    . fish oil-omega-3 fatty acids 1000 MG capsule Take 1 g by mouth daily.    . fluticasone (FLONASE) 50 MCG/ACT nasal spray Place 2 sprays into both nostrils daily. 16 g 6  . GARLIC-PARSLEY PO Take 1 tablet by mouth daily.    . Ginkgo Biloba 40 MG TABS Take 40 mg by mouth daily.     Marland Kitchen HAWTHORNE BERRY PO Take 1 tablet by mouth daily.     . hydrochlorothiazide (HYDRODIURIL) 25 MG tablet Take 12.5 mg by mouth daily.     . MULTIPLE VITAMIN PO Take 1 tablet by mouth daily.     . Nattokinase 100 MG CAPS Take 1 capsule by mouth 2 (two) times daily.    . Red Yeast Rice Extract (RED YEAST RICE PO) Take 650 mg by mouth 2 (two) times daily.    . ciprofloxacin (CIPRO) 250 MG tablet Take 1 tablet (250 mg total) by mouth 2 (two) times  daily. (Patient not taking: Reported on 04/26/2015) 6 tablet 0   No facility-administered medications prior to visit.    PE: Blood pressure 99/68, pulse 59, temperature 98.4 F (36.9 C), temperature source Oral, resp. rate 16, height 5\' 5"  (1.651 m), weight 185 lb (83.915 kg), SpO2 97 %. VS: noted--normal. Gen: alert, NAD, NONTOXIC APPEARING. HEENT: eyes without injection, drainage, or swelling.  Ears: EACs clear, TMs with normal light reflex and landmarks.  Nose: Clear rhinorrhea, with some dried, crusty exudate adherent to mildly injected mucosa.  No purulent d/c.  Mild L.R paranasal sinus TTP.  No facial swelling.  Throat and mouth without focal lesion.  No pharyngial swelling, erythema, or exudate.   Neck: supple, no LAD.   LUNGS: CTA bilat, nonlabored resps.   CV: RRR, no m/r/g. EXT: no c/c/e SKIN: no rash  IMPRESSION AND PLAN:  Acute sinusitis. Z-pack rx'd. Continue current symptomatic care. Signs/symptoms to call or return for were reviewed and pt expressed understanding.  She declined my offer of flu vaccine today.  FOLLOW UP: prn

## 2015-04-26 NOTE — Progress Notes (Signed)
Pre visit review using our clinic review tool, if applicable. No additional management support is needed unless otherwise documented below in the visit note. 

## 2015-06-13 ENCOUNTER — Ambulatory Visit (INDEPENDENT_AMBULATORY_CARE_PROVIDER_SITE_OTHER): Admitting: Family Medicine

## 2015-06-13 ENCOUNTER — Other Ambulatory Visit (HOSPITAL_COMMUNITY)
Admission: RE | Admit: 2015-06-13 | Discharge: 2015-06-13 | Disposition: A | Discharge status: Home / Self Care | Source: Ambulatory Visit | Attending: Family Medicine | Admitting: Family Medicine

## 2015-06-13 ENCOUNTER — Encounter: Payer: Self-pay | Admitting: Family Medicine

## 2015-06-13 VITALS — BP 138/80 | HR 54 | Temp 98.5°F | Ht 65.0 in | Wt 188.4 lb

## 2015-06-13 DIAGNOSIS — Z01419 Encounter for gynecological examination (general) (routine) without abnormal findings: Secondary | ICD-10-CM | POA: Diagnosis not present

## 2015-06-13 DIAGNOSIS — E8881 Metabolic syndrome: Secondary | ICD-10-CM | POA: Diagnosis not present

## 2015-06-13 DIAGNOSIS — Z0000 Encounter for general adult medical examination without abnormal findings: Secondary | ICD-10-CM

## 2015-06-13 DIAGNOSIS — Z1159 Encounter for screening for other viral diseases: Secondary | ICD-10-CM

## 2015-06-13 DIAGNOSIS — I10 Essential (primary) hypertension: Secondary | ICD-10-CM | POA: Diagnosis not present

## 2015-06-13 DIAGNOSIS — J0110 Acute frontal sinusitis, unspecified: Secondary | ICD-10-CM

## 2015-06-13 DIAGNOSIS — Z114 Encounter for screening for human immunodeficiency virus [HIV]: Secondary | ICD-10-CM

## 2015-06-13 MED ORDER — DOXYCYCLINE HYCLATE 100 MG PO TABS: 100.0000 mg | ORAL_TABLET | Freq: Two times a day (BID) | ORAL | Status: DC

## 2015-06-13 MED ORDER — AMLODIPINE BESYLATE 5 MG PO TABS: 5.0000 mg | ORAL_TABLET | Freq: Every day | ORAL | Status: DC

## 2015-06-13 MED ORDER — HYDROCHLOROTHIAZIDE 25 MG PO TABS: 12.5000 mg | ORAL_TABLET | Freq: Every day | ORAL | Status: DC

## 2015-06-13 MED ORDER — LEVOCETIRIZINE DIHYDROCHLORIDE 5 MG PO TABS: 5.0000 mg | ORAL_TABLET | Freq: Every evening | ORAL | Status: DC

## 2015-06-13 NOTE — Patient Instructions (Signed)
Preventive Care for Adults, Female A healthy lifestyle and preventive care can promote health and wellness. Preventive health guidelines for women include the following key practices.  A routine yearly physical is a good way to check with your health care provider about your health and preventive screening. It is a chance to share any concerns and updates on your health and to receive a thorough exam.  Visit your dentist for a routine exam and preventive care every 6 months. Brush your teeth twice a day and floss once a day. Good oral hygiene prevents tooth decay and gum disease.  The frequency of eye exams is based on your age, health, family medical history, use of contact lenses, and other factors. Follow your health care provider's recommendations for frequency of eye exams.  Eat a healthy diet. Foods like vegetables, fruits, whole grains, low-fat dairy products, and lean protein foods contain the nutrients you need without too many calories. Decrease your intake of foods high in solid fats, added sugars, and salt. Eat the right amount of calories for you.Get information about a proper diet from your health care provider, if necessary.  Regular physical exercise is one of the most important things you can do for your health. Most adults should get at least 150 minutes of moderate-intensity exercise (any activity that increases your heart rate and causes you to sweat) each week. In addition, most adults need muscle-strengthening exercises on 2 or more days a week.  Maintain a healthy weight. The body mass index (BMI) is a screening tool to identify possible weight problems. It provides an estimate of body fat based on height and weight. Your health care provider can find your BMI and can help you achieve or maintain a healthy weight.For adults 20 years and older:  A BMI below 18.5 is considered underweight.  A BMI of 18.5 to 24.9 is normal.  A BMI of 25 to 29.9 is considered overweight.  A  BMI of 30 and above is considered obese.  Maintain normal blood lipids and cholesterol levels by exercising and minimizing your intake of saturated fat. Eat a balanced diet with plenty of fruit and vegetables. Blood tests for lipids and cholesterol should begin at age 45 and be repeated every 5 years. If your lipid or cholesterol levels are high, you are over 50, or you are at high risk for heart disease, you may need your cholesterol levels checked more frequently.Ongoing high lipid and cholesterol levels should be treated with medicines if diet and exercise are not working.  If you smoke, find out from your health care provider how to quit. If you do not use tobacco, do not start.  Lung cancer screening is recommended for adults aged 45-80 years who are at high risk for developing lung cancer because of a history of smoking. A yearly low-dose CT scan of the lungs is recommended for people who have at least a 30-pack-year history of smoking and are a current smoker or have quit within the past 15 years. A pack year of smoking is smoking an average of 1 pack of cigarettes a day for 1 year (for example: 1 pack a day for 30 years or 2 packs a day for 15 years). Yearly screening should continue until the smoker has stopped smoking for at least 15 years. Yearly screening should be stopped for people who develop a health problem that would prevent them from having lung cancer treatment.  If you are pregnant, do not drink alcohol. If you are  breastfeeding, be very cautious about drinking alcohol. If you are not pregnant and choose to drink alcohol, do not have more than 1 drink per day. One drink is considered to be 12 ounces (355 mL) of beer, 5 ounces (148 mL) of wine, or 1.5 ounces (44 mL) of liquor.  Avoid use of street drugs. Do not share needles with anyone. Ask for help if you need support or instructions about stopping the use of drugs.  High blood pressure causes heart disease and increases the risk  of stroke. Your blood pressure should be checked at least every 1 to 2 years. Ongoing high blood pressure should be treated with medicines if weight loss and exercise do not work.  If you are 55-79 years old, ask your health care provider if you should take aspirin to prevent strokes.  Diabetes screening is done by taking a blood sample to check your blood glucose level after you have not eaten for a certain period of time (fasting). If you are not overweight and you do not have risk factors for diabetes, you should be screened once every 3 years starting at age 45. If you are overweight or obese and you are 40-70 years of age, you should be screened for diabetes every year as part of your cardiovascular risk assessment.  Breast cancer screening is essential preventive care for women. You should practice "breast self-awareness." This means understanding the normal appearance and feel of your breasts and may include breast self-examination. Any changes detected, no matter how small, should be reported to a health care provider. Women in their 20s and 30s should have a clinical breast exam (CBE) by a health care provider as part of a regular health exam every 1 to 3 years. After age 40, women should have a CBE every year. Starting at age 40, women should consider having a mammogram (breast X-ray test) every year. Women who have a family history of breast cancer should talk to their health care provider about genetic screening. Women at a high risk of breast cancer should talk to their health care providers about having an MRI and a mammogram every year.  Breast cancer gene (BRCA)-related cancer risk assessment is recommended for women who have family members with BRCA-related cancers. BRCA-related cancers include breast, ovarian, tubal, and peritoneal cancers. Having family members with these cancers may be associated with an increased risk for harmful changes (mutations) in the breast cancer genes BRCA1 and  BRCA2. Results of the assessment will determine the need for genetic counseling and BRCA1 and BRCA2 testing.  Your health care provider may recommend that you be screened regularly for cancer of the pelvic organs (ovaries, uterus, and vagina). This screening involves a pelvic examination, including checking for microscopic changes to the surface of your cervix (Pap test). You may be encouraged to have this screening done every 3 years, beginning at age 21.  For women ages 30-65, health care providers may recommend pelvic exams and Pap testing every 3 years, or they may recommend the Pap and pelvic exam, combined with testing for human papilloma virus (HPV), every 5 years. Some types of HPV increase your risk of cervical cancer. Testing for HPV may also be done on women of any age with unclear Pap test results.  Other health care providers may not recommend any screening for nonpregnant women who are considered low risk for pelvic cancer and who do not have symptoms. Ask your health care provider if a screening pelvic exam is right for   you.  If you have had past treatment for cervical cancer or a condition that could lead to cancer, you need Pap tests and screening for cancer for at least 20 years after your treatment. If Pap tests have been discontinued, your risk factors (such as having a new sexual partner) need to be reassessed to determine if screening should resume. Some women have medical problems that increase the chance of getting cervical cancer. In these cases, your health care provider may recommend more frequent screening and Pap tests.  Colorectal cancer can be detected and often prevented. Most routine colorectal cancer screening begins at the age of 50 years and continues through age 75 years. However, your health care provider may recommend screening at an earlier age if you have risk factors for colon cancer. On a yearly basis, your health care provider may provide home test kits to check  for hidden blood in the stool. Use of a small camera at the end of a tube, to directly examine the colon (sigmoidoscopy or colonoscopy), can detect the earliest forms of colorectal cancer. Talk to your health care provider about this at age 50, when routine screening begins. Direct exam of the colon should be repeated every 5-10 years through age 75 years, unless early forms of precancerous polyps or small growths are found.  People who are at an increased risk for hepatitis B should be screened for this virus. You are considered at high risk for hepatitis B if:  You were born in a country where hepatitis B occurs often. Talk with your health care provider about which countries are considered high risk.  Your parents were born in a high-risk country and you have not received a shot to protect against hepatitis B (hepatitis B vaccine).  You have HIV or AIDS.  You use needles to inject street drugs.  You live with, or have sex with, someone who has hepatitis B.  You get hemodialysis treatment.  You take certain medicines for conditions like cancer, organ transplantation, and autoimmune conditions.  Hepatitis C blood testing is recommended for all people born from 1945 through 1965 and any individual with known risks for hepatitis C.  Practice safe sex. Use condoms and avoid high-risk sexual practices to reduce the spread of sexually transmitted infections (STIs). STIs include gonorrhea, chlamydia, syphilis, trichomonas, herpes, HPV, and human immunodeficiency virus (HIV). Herpes, HIV, and HPV are viral illnesses that have no cure. They can result in disability, cancer, and death.  You should be screened for sexually transmitted illnesses (STIs) including gonorrhea and chlamydia if:  You are sexually active and are younger than 24 years.  You are older than 24 years and your health care provider tells you that you are at risk for this type of infection.  Your sexual activity has changed  since you were last screened and you are at an increased risk for chlamydia or gonorrhea. Ask your health care provider if you are at risk.  If you are at risk of being infected with HIV, it is recommended that you take a prescription medicine daily to prevent HIV infection. This is called preexposure prophylaxis (PrEP). You are considered at risk if:  You are sexually active and do not regularly use condoms or know the HIV status of your partner(s).  You take drugs by injection.  You are sexually active with a partner who has HIV.  Talk with your health care provider about whether you are at high risk of being infected with HIV. If   you choose to begin PrEP, you should first be tested for HIV. You should then be tested every 3 months for as long as you are taking PrEP.  Osteoporosis is a disease in which the bones lose minerals and strength with aging. This can result in serious bone fractures or breaks. The risk of osteoporosis can be identified using a bone density scan. Women ages 67 years and over and women at risk for fractures or osteoporosis should discuss screening with their health care providers. Ask your health care provider whether you should take a calcium supplement or vitamin D to reduce the rate of osteoporosis.  Menopause can be associated with physical symptoms and risks. Hormone replacement therapy is available to decrease symptoms and risks. You should talk to your health care provider about whether hormone replacement therapy is right for you.  Use sunscreen. Apply sunscreen liberally and repeatedly throughout the day. You should seek shade when your shadow is shorter than you. Protect yourself by wearing long sleeves, pants, a wide-brimmed hat, and sunglasses year round, whenever you are outdoors.  Once a month, do a whole body skin exam, using a mirror to look at the skin on your back. Tell your health care provider of new moles, moles that have irregular borders, moles that  are larger than a pencil eraser, or moles that have changed in shape or color.  Stay current with required vaccines (immunizations).  Influenza vaccine. All adults should be immunized every year.  Tetanus, diphtheria, and acellular pertussis (Td, Tdap) vaccine. Pregnant women should receive 1 dose of Tdap vaccine during each pregnancy. The dose should be obtained regardless of the length of time since the last dose. Immunization is preferred during the 27th-36th week of gestation. An adult who has not previously received Tdap or who does not know her vaccine status should receive 1 dose of Tdap. This initial dose should be followed by tetanus and diphtheria toxoids (Td) booster doses every 10 years. Adults with an unknown or incomplete history of completing a 3-dose immunization series with Td-containing vaccines should begin or complete a primary immunization series including a Tdap dose. Adults should receive a Td booster every 10 years.  Varicella vaccine. An adult without evidence of immunity to varicella should receive 2 doses or a second dose if she has previously received 1 dose. Pregnant females who do not have evidence of immunity should receive the first dose after pregnancy. This first dose should be obtained before leaving the health care facility. The second dose should be obtained 4-8 weeks after the first dose.  Human papillomavirus (HPV) vaccine. Females aged 13-26 years who have not received the vaccine previously should obtain the 3-dose series. The vaccine is not recommended for use in pregnant females. However, pregnancy testing is not needed before receiving a dose. If a female is found to be pregnant after receiving a dose, no treatment is needed. In that case, the remaining doses should be delayed until after the pregnancy. Immunization is recommended for any person with an immunocompromised condition through the age of 61 years if she did not get any or all doses earlier. During the  3-dose series, the second dose should be obtained 4-8 weeks after the first dose. The third dose should be obtained 24 weeks after the first dose and 16 weeks after the second dose.  Zoster vaccine. One dose is recommended for adults aged 30 years or older unless certain conditions are present.  Measles, mumps, and rubella (MMR) vaccine. Adults born  before 1957 generally are considered immune to measles and mumps. Adults born in 1957 or later should have 1 or more doses of MMR vaccine unless there is a contraindication to the vaccine or there is laboratory evidence of immunity to each of the three diseases. A routine second dose of MMR vaccine should be obtained at least 28 days after the first dose for students attending postsecondary schools, health care workers, or international travelers. People who received inactivated measles vaccine or an unknown type of measles vaccine during 1963-1967 should receive 2 doses of MMR vaccine. People who received inactivated mumps vaccine or an unknown type of mumps vaccine before 1979 and are at high risk for mumps infection should consider immunization with 2 doses of MMR vaccine. For females of childbearing age, rubella immunity should be determined. If there is no evidence of immunity, females who are not pregnant should be vaccinated. If there is no evidence of immunity, females who are pregnant should delay immunization until after pregnancy. Unvaccinated health care workers born before 1957 who lack laboratory evidence of measles, mumps, or rubella immunity or laboratory confirmation of disease should consider measles and mumps immunization with 2 doses of MMR vaccine or rubella immunization with 1 dose of MMR vaccine.  Pneumococcal 13-valent conjugate (PCV13) vaccine. When indicated, a person who is uncertain of his immunization history and has no record of immunization should receive the PCV13 vaccine. All adults 65 years of age and older should receive this  vaccine. An adult aged 19 years or older who has certain medical conditions and has not been previously immunized should receive 1 dose of PCV13 vaccine. This PCV13 should be followed with a dose of pneumococcal polysaccharide (PPSV23) vaccine. Adults who are at high risk for pneumococcal disease should obtain the PPSV23 vaccine at least 8 weeks after the dose of PCV13 vaccine. Adults older than 63 years of age who have normal immune system function should obtain the PPSV23 vaccine dose at least 1 year after the dose of PCV13 vaccine.  Pneumococcal polysaccharide (PPSV23) vaccine. When PCV13 is also indicated, PCV13 should be obtained first. All adults aged 65 years and older should be immunized. An adult younger than age 65 years who has certain medical conditions should be immunized. Any person who resides in a nursing home or long-term care facility should be immunized. An adult smoker should be immunized. People with an immunocompromised condition and certain other conditions should receive both PCV13 and PPSV23 vaccines. People with human immunodeficiency virus (HIV) infection should be immunized as soon as possible after diagnosis. Immunization during chemotherapy or radiation therapy should be avoided. Routine use of PPSV23 vaccine is not recommended for American Indians, [place] Natives, or people younger than 65 years unless there are medical conditions that require PPSV23 vaccine. When indicated, people who have unknown immunization and have no record of immunization should receive PPSV23 vaccine. One-time revaccination 5 years after the first dose of PPSV23 is recommended for people aged 19-64 years who have chronic kidney failure, nephrotic syndrome, asplenia, or immunocompromised conditions. People who received 1-2 doses of PPSV23 before age 65 years should receive another dose of PPSV23 vaccine at age 65 years or later if at least 5 years have passed since the previous dose. Doses of PPSV23 are not  needed for people immunized with PPSV23 at or after age 65 years.  Meningococcal vaccine. Adults with asplenia or persistent complement component deficiencies should receive 2 doses of quadrivalent meningococcal conjugate (MenACWY-D) vaccine. The doses should be obtained   at least 2 months apart. Microbiologists working with certain meningococcal bacteria, [place] recruits, people at risk during an outbreak, and people who travel to or live in countries with a high rate of meningitis should be immunized. A first-year college student up through age 34 years who is living in a residence hall should receive a dose if she did not receive a dose on or after her 16th birthday. Adults who have certain high-risk conditions should receive one or more doses of vaccine.  Hepatitis A vaccine. Adults who wish to be protected from this disease, have certain high-risk conditions, work with hepatitis A-infected animals, work in hepatitis A research labs, or travel to or work in countries with a high rate of hepatitis A should be immunized. Adults who were previously unvaccinated and who anticipate close contact with an international adoptee during the first 60 days after arrival in the [place] from a country with a high rate of hepatitis A should be immunized.  Hepatitis B vaccine. Adults who wish to be protected from this disease, have certain high-risk conditions, may be exposed to blood or other infectious body fluids, are household contacts or sex partners of hepatitis B positive people, are clients or workers in certain care facilities, or travel to or work in countries with a high rate of hepatitis B should be immunized.  Haemophilus influenzae type b (Hib) vaccine. A previously unvaccinated person with asplenia or sickle cell disease or having a scheduled splenectomy should receive 1 dose of Hib vaccine. Regardless of previous immunization, a recipient of a hematopoietic stem cell transplant should receive a  3-dose series 6-12 months after her successful transplant. Hib vaccine is not recommended for adults with HIV infection. Preventive Services / Frequency Ages 35 to 4 years  Blood pressure check.** / Every 3-5 years.  Lipid and cholesterol check.** / Every 5 years beginning at age 60.  Clinical breast exam.** / Every 3 years for women in their 71s and 10s.  BRCA-related cancer risk assessment.** / For women who have family members with a BRCA-related cancer (breast, ovarian, tubal, or peritoneal cancers).  Pap test.** / Every 2 years from ages 76 through 26. Every 3 years starting at age 61 through age 76 or 93 with a history of 3 consecutive normal Pap tests.  HPV screening.** / Every 3 years from ages 37 through ages 60 to 51 with a history of 3 consecutive normal Pap tests.  Hepatitis C blood test.** / For any individual with known risks for hepatitis C.  Skin self-exam. / Monthly.  Influenza vaccine. / Every year.  Tetanus, diphtheria, and acellular pertussis (Tdap, Td) vaccine.** / Consult your health care provider. Pregnant women should receive 1 dose of Tdap vaccine during each pregnancy. 1 dose of Td every 10 years.  Varicella vaccine.** / Consult your health care provider. Pregnant females who do not have evidence of immunity should receive the first dose after pregnancy.  HPV vaccine. / 3 doses over 6 months, if 93 and younger. The vaccine is not recommended for use in pregnant females. However, pregnancy testing is not needed before receiving a dose.  Measles, mumps, rubella (MMR) vaccine.** / You need at least 1 dose of MMR if you were born in 1957 or later. You may also need a 2nd dose. For females of childbearing age, rubella immunity should be determined. If there is no evidence of immunity, females who are not pregnant should be vaccinated. If there is no evidence of immunity, females who are  pregnant should delay immunization until after pregnancy.  Pneumococcal  13-valent conjugate (PCV13) vaccine.** / Consult your health care provider.  Pneumococcal polysaccharide (PPSV23) vaccine.** / 1 to 2 doses if you smoke cigarettes or if you have certain conditions.  Meningococcal vaccine.** / 1 dose if you are age 68 to 8 years and a Market researcher living in a residence hall, or have one of several medical conditions, you need to get vaccinated against meningococcal disease. You may also need additional booster doses.  Hepatitis A vaccine.** / Consult your health care provider.  Hepatitis B vaccine.** / Consult your health care provider.  Haemophilus influenzae type b (Hib) vaccine.** / Consult your health care provider. Ages 7 to 53 years  Blood pressure check.** / Every year.  Lipid and cholesterol check.** / Every 5 years beginning at age 25 years.  Lung cancer screening. / Every year if you are aged 11-80 years and have a 30-pack-year history of smoking and currently smoke or have quit within the past 15 years. Yearly screening is stopped once you have quit smoking for at least 15 years or develop a health problem that would prevent you from having lung cancer treatment.  Clinical breast exam.** / Every year after age 48 years.  BRCA-related cancer risk assessment.** / For women who have family members with a BRCA-related cancer (breast, ovarian, tubal, or peritoneal cancers).  Mammogram.** / Every year beginning at age 41 years and continuing for as long as you are in good health. Consult with your health care provider.  Pap test.** / Every 3 years starting at age 65 years through age 37 or 70 years with a history of 3 consecutive normal Pap tests.  HPV screening.** / Every 3 years from ages 72 years through ages 60 to 40 years with a history of 3 consecutive normal Pap tests.  Fecal occult blood test (FOBT) of stool. / Every year beginning at age 21 years and continuing until age 5 years. You may not need to do this test if you get  a colonoscopy every 10 years.  Flexible sigmoidoscopy or colonoscopy.** / Every 5 years for a flexible sigmoidoscopy or every 10 years for a colonoscopy beginning at age 35 years and continuing until age 48 years.  Hepatitis C blood test.** / For all people born from [birth year] through 1965 and any individual with known risks for hepatitis C.  Skin self-exam. / Monthly.  Influenza vaccine. / Every year.  Tetanus, diphtheria, and acellular pertussis (Tdap/Td) vaccine.** / Consult your health care provider. Pregnant women should receive 1 dose of Tdap vaccine during each pregnancy. 1 dose of Td every 10 years.  Varicella vaccine.** / Consult your health care provider. Pregnant females who do not have evidence of immunity should receive the first dose after pregnancy.  Zoster vaccine.** / 1 dose for adults aged 30 years or older.  Measles, mumps, rubella (MMR) vaccine.** / You need at least 1 dose of MMR if you were born in 1957 or later. You may also need a second dose. For females of childbearing age, rubella immunity should be determined. If there is no evidence of immunity, females who are not pregnant should be vaccinated. If there is no evidence of immunity, females who are pregnant should delay immunization until after pregnancy.  Pneumococcal 13-valent conjugate (PCV13) vaccine.** / Consult your health care provider.  Pneumococcal polysaccharide (PPSV23) vaccine.** / 1 to 2 doses if you smoke cigarettes or if you have certain conditions.  Meningococcal vaccine.** /  Consult your health care provider.  Hepatitis A vaccine.** / Consult your health care provider.  Hepatitis B vaccine.** / Consult your health care provider.  Haemophilus influenzae type b (Hib) vaccine.** / Consult your health care provider. Ages 64 years and over  Blood pressure check.** / Every year.  Lipid and cholesterol check.** / Every 5 years beginning at age 23 years.  Lung cancer screening. / Every year if you  are aged 16-80 years and have a 30-pack-year history of smoking and currently smoke or have quit within the past 15 years. Yearly screening is stopped once you have quit smoking for at least 15 years or develop a health problem that would prevent you from having lung cancer treatment.  Clinical breast exam.** / Every year after age 74 years.  BRCA-related cancer risk assessment.** / For women who have family members with a BRCA-related cancer (breast, ovarian, tubal, or peritoneal cancers).  Mammogram.** / Every year beginning at age 44 years and continuing for as long as you are in good health. Consult with your health care provider.  Pap test.** / Every 3 years starting at age 58 years through age 22 or 39 years with 3 consecutive normal Pap tests. Testing can be stopped between 65 and 70 years with 3 consecutive normal Pap tests and no abnormal Pap or HPV tests in the past 10 years.  HPV screening.** / Every 3 years from ages 64 years through ages 70 or 61 years with a history of 3 consecutive normal Pap tests. Testing can be stopped between 65 and 70 years with 3 consecutive normal Pap tests and no abnormal Pap or HPV tests in the past 10 years.  Fecal occult blood test (FOBT) of stool. / Every year beginning at age 40 years and continuing until age 27 years. You may not need to do this test if you get a colonoscopy every 10 years.  Flexible sigmoidoscopy or colonoscopy.** / Every 5 years for a flexible sigmoidoscopy or every 10 years for a colonoscopy beginning at age 7 years and continuing until age 32 years.  Hepatitis C blood test.** / For all people born from [birth year] through 1965 and any individual with known risks for hepatitis C.  Osteoporosis screening.** / A one-time screening for women ages 30 years and over and women at risk for fractures or osteoporosis.  Skin self-exam. / Monthly.  Influenza vaccine. / Every year.  Tetanus, diphtheria, and acellular pertussis (Tdap/Td)  vaccine.** / 1 dose of Td every 10 years.  Varicella vaccine.** / Consult your health care provider.  Zoster vaccine.** / 1 dose for adults aged 35 years or older.  Pneumococcal 13-valent conjugate (PCV13) vaccine.** / Consult your health care provider.  Pneumococcal polysaccharide (PPSV23) vaccine.** / 1 dose for all adults aged 46 years and older.  Meningococcal vaccine.** / Consult your health care provider.  Hepatitis A vaccine.** / Consult your health care provider.  Hepatitis B vaccine.** / Consult your health care provider.  Haemophilus influenzae type b (Hib) vaccine.** / Consult your health care provider. ** Family history and personal history of risk and conditions may change your health care provider's recommendations.   This information is not intended to replace advice given to you by your health care provider. Make sure you discuss any questions you have with your health care provider.   Document Released: 09/08/2001 Document Revised: 08/03/2014 Document Reviewed: 12/08/2010 Elsevier Interactive Patient Education [employer].

## 2015-06-13 NOTE — Progress Notes (Signed)
Pre visit review using our clinic review tool, if applicable. No additional management support is needed unless otherwise documented below in the visit note. 
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
## 2015-06-17 LAB — CYTOLOGY - PAP

## 2015-07-17 ENCOUNTER — Encounter: Payer: Self-pay | Admitting: Family Medicine

## 2015-07-17 ENCOUNTER — Ambulatory Visit (INDEPENDENT_AMBULATORY_CARE_PROVIDER_SITE_OTHER): Admitting: Family Medicine

## 2015-07-17 VITALS — BP 125/79 | HR 55 | Temp 98.1°F | Resp 16 | Ht 65.0 in

## 2015-07-17 DIAGNOSIS — J019 Acute sinusitis, unspecified: Secondary | ICD-10-CM | POA: Diagnosis not present

## 2015-07-17 DIAGNOSIS — J0101 Acute recurrent maxillary sinusitis: Secondary | ICD-10-CM

## 2015-07-17 MED ORDER — BENZONATATE 100 MG PO CAPS: 100.0000 mg | ORAL_CAPSULE | Freq: Three times a day (TID) | ORAL | Status: DC | PRN

## 2015-07-17 MED ORDER — LEVOFLOXACIN 500 MG PO TABS: 500.0000 mg | ORAL_TABLET | Freq: Every day | ORAL | Status: DC

## 2015-07-17 NOTE — Patient Instructions (Addendum)
Please complete antibiotic as directed. You may use a probiotic if needed during therapy. Use cough medication as needed, increase fluids, and rest. If these symptoms persist, an ENT referral will be placed for evaluation of sinuses.   Can use flonase and Allegra, Claritin, or Zyrtec for allergic rhinitis symptoms as needed. If symptoms do not improve in 3 days, worsen, or temp >101, please contact clinic for follow up.  Sinusitis, Adult Sinusitis is redness, soreness, and inflammation of the paranasal sinuses. Paranasal sinuses are air pockets within the bones of your face. They are located beneath your eyes, in the middle of your forehead, and above your eyes. In healthy paranasal sinuses, mucus is able to drain out, and air is able to circulate through them by way of your nose. However, when your paranasal sinuses are inflamed, mucus and air can become trapped. This can allow bacteria and other germs to grow and cause infection. Sinusitis can develop quickly and last only a short time (acute) or continue over a long period (chronic). Sinusitis that lasts for more than 12 weeks is considered chronic. CAUSES Causes of sinusitis include:  Allergies.  Structural abnormalities, such as displacement of the cartilage that separates your nostrils (deviated septum), which can decrease the air flow through your nose and sinuses and affect sinus drainage.  Functional abnormalities, such as when the small hairs (cilia) that line your sinuses and help remove mucus do not work properly or are not present. SIGNS AND SYMPTOMS Symptoms of acute and chronic sinusitis are the same. The primary symptoms are pain and pressure around the affected sinuses. Other symptoms include:  Upper toothache.  Earache.  Headache.  Bad breath.  Decreased sense of smell and taste.  A cough, which worsens when you are lying flat.  Fatigue.  Fever.  Thick drainage from your nose, which often is green and may contain  pus (purulent).  Swelling and warmth over the affected sinuses. DIAGNOSIS Your health care provider will perform a physical exam. During your exam, your health care provider may perform any of the following to help determine if you have acute sinusitis or chronic sinusitis:  Look in your nose for signs of abnormal growths in your nostrils (nasal polyps).  Tap over the affected sinus to check for signs of infection.  View the inside of your sinuses using an imaging device that has a light attached (endoscope). If your health care provider suspects that you have chronic sinusitis, one or more of the following tests may be recommended:  Allergy tests.  Nasal culture. A sample of mucus is taken from your nose, sent to a lab, and screened for bacteria.  Nasal cytology. A sample of mucus is taken from your nose and examined by your health care provider to determine if your sinusitis is related to an allergy. TREATMENT Most cases of acute sinusitis are related to a viral infection and will resolve on their own within 10 days. Sometimes, medicines are prescribed to help relieve symptoms of both acute and chronic sinusitis. These may include pain medicines, decongestants, nasal steroid sprays, or saline sprays. However, for sinusitis related to a bacterial infection, your health care provider will prescribe antibiotic medicines. These are medicines that will help kill the bacteria causing the infection. Rarely, sinusitis is caused by a fungal infection. In these cases, your health care provider will prescribe antifungal medicine. For some cases of chronic sinusitis, surgery is needed. Generally, these are cases in which sinusitis recurs more than 3 times per year,  despite other treatments. HOME CARE INSTRUCTIONS  Drink plenty of water. Water helps thin the mucus so your sinuses can drain more easily.  Use a humidifier.  Inhale steam 3-4 times a day (for example, sit in the bathroom with the  shower running).  Apply a warm, moist washcloth to your face 3-4 times a day, or as directed by your health care provider.  Use saline nasal sprays to help moisten and clean your sinuses.  Take medicines only as directed by your health care provider.  If you were prescribed either an antibiotic or antifungal medicine, finish it all even if you start to feel better. SEEK IMMEDIATE MEDICAL CARE IF:  You have increasing pain or severe headaches.  You have nausea, vomiting, or drowsiness.  You have swelling around your face.  You have vision problems.  You have a stiff neck.  You have difficulty breathing.   This information is not intended to replace advice given to you by your health care provider. Make sure you discuss any questions you have with your health care provider.   Document Released: 07/13/2005 Document Revised: 08/03/2014 Document Reviewed: 07/28/2011 Elsevier Interactive Patient Education [employer].
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
## 2015-07-17 NOTE — Progress Notes (Signed)
Pre visit review using our clinic review tool, if applicable. No additional management support is needed unless otherwise documented below in the visit note. 

## 2015-07-17 NOTE — Assessment & Plan Note (Addendum)
Acute Maxillary Sinusitis: Levofloxacin prescribed for suspected recurrent sinusitis and due to patient's noted allergies and recent treatment with doxycycline. Complete antibiotic as directed. She can use a probiotic during the antibiotic course if needed. Patient advised that if symptoms do not improve in 3-4 days, worsen, or temp >101 to contact clinic for follow up. Discussed with patient an ENT referral if this course of antibiotics does not resolve infection. She voiced understanding and agreed with treatment plan. Patient further advised to consider flonase for nasal symptoms,increase fluids, rest, and use cough medication to control cough at night. She has used claritin with limited benefit but is considering use of either allegra or zyrtec for allergy symptoms prn.

## 2015-08-16 ENCOUNTER — Ambulatory Visit (HOSPITAL_BASED_OUTPATIENT_CLINIC_OR_DEPARTMENT_OTHER)
Admission: RE | Admit: 2015-08-16 | Discharge: 2015-08-16 | Disposition: A | Discharge status: Home / Self Care | Source: Ambulatory Visit | Attending: Family Medicine | Admitting: Family Medicine

## 2015-08-16 ENCOUNTER — Ambulatory Visit (INDEPENDENT_AMBULATORY_CARE_PROVIDER_SITE_OTHER): Admitting: Family Medicine

## 2015-08-16 ENCOUNTER — Encounter: Payer: Self-pay | Admitting: Family Medicine

## 2015-08-16 VITALS — BP 122/84 | HR 55 | Temp 98.1°F | Wt 182.0 lb

## 2015-08-16 DIAGNOSIS — I10 Essential (primary) hypertension: Secondary | ICD-10-CM

## 2015-08-16 DIAGNOSIS — X58XXXD Exposure to other specified factors, subsequent encounter: Secondary | ICD-10-CM | POA: Diagnosis not present

## 2015-08-16 DIAGNOSIS — S2220XG Unspecified fracture of sternum, subsequent encounter for fracture with delayed healing: Secondary | ICD-10-CM | POA: Diagnosis not present

## 2015-08-16 DIAGNOSIS — R739 Hyperglycemia, unspecified: Secondary | ICD-10-CM | POA: Diagnosis not present

## 2015-08-16 DIAGNOSIS — E785 Hyperlipidemia, unspecified: Secondary | ICD-10-CM | POA: Diagnosis not present

## 2015-08-16 DIAGNOSIS — J0110 Acute frontal sinusitis, unspecified: Secondary | ICD-10-CM

## 2015-08-16 LAB — CBC WITH DIFFERENTIAL/PLATELET
BASOS PCT: 0 % (ref 0–1)
Basophils Absolute: 0 10*3/uL (ref 0.0–0.1)
EOS ABS: 0.1 10*3/uL (ref 0.0–0.7)
Eosinophils Relative: 2 % (ref 0–5)
HCT: 42 % (ref 36.0–46.0)
HEMOGLOBIN: 14.6 g/dL (ref 12.0–15.0)
Lymphocytes Relative: 43 % (ref 12–46)
Lymphs Abs: 1.5 10*3/uL (ref 0.7–4.0)
MCH: 30.9 pg (ref 26.0–34.0)
MCHC: 34.8 g/dL (ref 30.0–36.0)
MCV: 88.8 fL (ref 78.0–100.0)
MONO ABS: 0.2 10*3/uL (ref 0.1–1.0)
MPV: 9 fL (ref 8.6–12.4)
Monocytes Relative: 7 % (ref 3–12)
NEUTROS ABS: 1.7 10*3/uL (ref 1.7–7.7)
Neutrophils Relative %: 48 % (ref 43–77)
PLATELETS: 269 10*3/uL (ref 150–400)
RBC: 4.73 MIL/uL (ref 3.87–5.11)
RDW: 13.9 % (ref 11.5–15.5)
WBC: 3.5 10*3/uL — AB (ref 4.0–10.5)

## 2015-08-16 LAB — LIPID PANEL
CHOL/HDL RATIO: 3 ratio (ref <=5.0)
CHOLESTEROL: 311 mg/dL — AB (ref 125–200)
HDL: 102 mg/dL (ref >=46)
LDL Cholesterol: 200 mg/dL — ABNORMAL HIGH (ref <130)
Triglycerides: 46 mg/dL (ref <150)
VLDL: 9 mg/dL (ref <30)

## 2015-08-16 LAB — HEMOGLOBIN A1C
HEMOGLOBIN A1C: 5.9 % — AB (ref <5.7)
MEAN PLASMA GLUCOSE: 123 mg/dL — AB (ref <117)

## 2015-08-16 LAB — POCT URINALYSIS DIPSTICK
BILIRUBIN UA: NEGATIVE
Blood, UA: NEGATIVE
Glucose, UA: NEGATIVE
KETONES UA: NEGATIVE
LEUKOCYTES UA: NEGATIVE
Nitrite, UA: NEGATIVE
PROTEIN UA: NEGATIVE
SPEC GRAV UA: 1.02
Urobilinogen, UA: 0.2
pH, UA: 6

## 2015-08-16 MED ORDER — FLUTICASONE PROPIONATE 50 MCG/ACT NA SUSP: 2.0000 | Freq: Every day | NASAL | Status: DC

## 2015-08-16 MED ORDER — LEVOCETIRIZINE DIHYDROCHLORIDE 5 MG PO TABS: 5.0000 mg | ORAL_TABLET | Freq: Every evening | ORAL | Status: DC

## 2015-08-16 NOTE — Progress Notes (Signed)
Pre visit review using our clinic review tool, if applicable. No additional management support is needed unless otherwise documented below in the visit note. 
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
## 2015-08-16 NOTE — Patient Instructions (Signed)
Hyperglycemia °Hyperglycemia occurs when the glucose (sugar) in your blood is too high. Hyperglycemia can happen for many reasons, but it most often happens to people who do not know they have diabetes or are not managing their diabetes properly.  °CAUSES  °Whether you have diabetes or not, there are other causes of hyperglycemia. Hyperglycemia can occur when you have diabetes, but it can also occur in other situations that you might not be as aware of, such as: °Diabetes °· If you have diabetes and are having problems controlling your blood glucose, hyperglycemia could occur because of some of the following reasons: °¨ Not following your meal plan. °¨ Not taking your diabetes medications or not taking it properly. °¨ Exercising less or doing less activity than you normally do. °¨ Being sick. °Pre-diabetes °· This cannot be ignored. Before people develop Type 2 diabetes, they almost always have "pre-diabetes." This is when your blood glucose levels are higher than normal, but not yet high enough to be diagnosed as diabetes. Research has shown that some long-term damage to the body, especially the heart and circulatory system, may already be occurring during pre-diabetes. If you take action to manage your blood glucose when you have pre-diabetes, you may delay or prevent Type 2 diabetes from developing. °Stress °· If you have diabetes, you may be "diet" controlled or on oral medications or insulin to control your diabetes. However, you may find that your blood glucose is higher than usual in the hospital whether you have diabetes or not. This is often referred to as "stress hyperglycemia." Stress can elevate your blood glucose. This happens because of hormones put out by the body during times of stress. If stress has been the cause of your high blood glucose, it can be followed regularly by your caregiver. That way he/she can make sure your hyperglycemia does not continue to get worse or progress to  diabetes. °Steroids °· Steroids are medications that act on the infection fighting system (immune system) to block inflammation or infection. One side effect can be a rise in blood glucose. Most people can produce enough extra insulin to allow for this rise, but for those who cannot, steroids make blood glucose levels go even higher. It is not unusual for steroid treatments to "uncover" diabetes that is developing. It is not always possible to determine if the hyperglycemia will go away after the steroids are stopped. A special blood test called an A1c is sometimes done to determine if your blood glucose was elevated before the steroids were started. °SYMPTOMS °· Thirsty. °· Frequent urination. °· Dry mouth. °· Blurred vision. °· Tired or fatigue. °· Weakness. °· Sleepy. °· Tingling in feet or leg. °DIAGNOSIS  °Diagnosis is made by monitoring blood glucose in one or all of the following ways: °· A1c test. This is a chemical found in your blood. °· Fingerstick blood glucose monitoring. °· Laboratory results. °TREATMENT  °First, knowing the cause of the hyperglycemia is important before the hyperglycemia can be treated. Treatment may include, but is not be limited to: °· Education. °· Change or adjustment in medications. °· Change or adjustment in meal plan. °· Treatment for an illness, infection, etc. °· More frequent blood glucose monitoring. °· Change in exercise plan. °· Decreasing or stopping steroids. °· Lifestyle changes. °HOME CARE INSTRUCTIONS  °· Test your blood glucose as directed. °· Exercise regularly. Your caregiver will give you instructions about exercise. Pre-diabetes or diabetes which comes on with stress is helped by exercising. °· Eat wholesome,   balanced meals. Eat often and at regular, fixed times. Your caregiver or nutritionist will give you a meal plan to guide your sugar intake. °· Being at an ideal weight is important. If needed, losing as little as 10 to 15 pounds may help improve blood  glucose levels. °SEEK MEDICAL CARE IF:  °· You have questions about medicine, activity, or diet. °· You continue to have symptoms (problems such as increased thirst, urination, or weight gain). °SEEK IMMEDIATE MEDICAL CARE IF:  °· You are vomiting or have diarrhea. °· Your breath smells fruity. °· You are breathing faster or slower. °· You are very sleepy or incoherent. °· You have numbness, tingling, or pain in your feet or hands. °· You have chest pain. °· Your symptoms get worse even though you have been following your caregiver's orders. °· If you have any other questions or concerns. °  °This information is not intended to replace advice given to you by your health care provider. Make sure you discuss any questions you have with your health care provider. °  °Document Released: 01/06/2001 Document Revised: 10/05/2011 Document Reviewed: 03/19/2015 °Elsevier Interactive Patient Education ©2016 [employer] ° °

## 2015-08-16 NOTE — Assessment & Plan Note (Addendum)
Stable HCTZ and norvasc

## 2015-08-17 LAB — COMPLETE METABOLIC PANEL WITH GFR
ALT: 23 U/L (ref 6–29)
AST: 23 U/L (ref 10–35)
Albumin: 4.6 g/dL (ref 3.6–5.1)
Alkaline Phosphatase: 58 U/L (ref 33–130)
BUN: 13 mg/dL (ref 7–25)
CALCIUM: 9.6 mg/dL (ref 8.6–10.4)
CHLORIDE: 99 mmol/L (ref 98–110)
CO2: 28 mmol/L (ref 20–31)
Creat: 0.77 mg/dL (ref 0.50–0.99)
GFR, EST NON AFRICAN AMERICAN: 82 mL/min (ref >=60)
Glucose, Bld: 106 mg/dL — ABNORMAL HIGH (ref 65–99)
POTASSIUM: 4 mmol/L (ref 3.5–5.3)
SODIUM: 140 mmol/L (ref 135–146)
Total Bilirubin: 1 mg/dL (ref 0.2–1.2)
Total Protein: 7.2 g/dL (ref 6.1–8.1)

## 2015-08-17 LAB — MICROALBUMIN / CREATININE URINE RATIO
Creatinine, Urine: 139 mg/dL (ref 20–320)
MICROALB/CREAT RATIO: 4 ug/mg{creat} (ref <30)
Microalb, Ur: 0.6 mg/dL

## 2015-10-21 ENCOUNTER — Encounter: Payer: Self-pay | Admitting: Medical

## 2015-10-21 ENCOUNTER — Ambulatory Visit (INDEPENDENT_AMBULATORY_CARE_PROVIDER_SITE_OTHER): Admitting: Medical

## 2015-10-21 ENCOUNTER — Telehealth: Payer: Self-pay | Admitting: Medical

## 2015-10-21 ENCOUNTER — Telehealth: Payer: Self-pay | Admitting: *Deleted

## 2015-10-21 VITALS — BP 120/80 | HR 58 | Temp 98.2°F | Ht 65.0 in | Wt 186.8 lb

## 2015-10-21 DIAGNOSIS — J0100 Acute maxillary sinusitis, unspecified: Secondary | ICD-10-CM | POA: Diagnosis not present

## 2015-10-21 DIAGNOSIS — J309 Allergic rhinitis, unspecified: Secondary | ICD-10-CM | POA: Diagnosis not present

## 2015-10-21 DIAGNOSIS — H6691 Otitis media, unspecified, right ear: Secondary | ICD-10-CM | POA: Diagnosis not present

## 2015-10-21 MED ORDER — AZELASTINE HCL 0.1 % NA SOLN: 2.0000 | Freq: Two times a day (BID) | NASAL | Status: DC

## 2015-10-21 MED ORDER — BENZONATATE 100 MG PO CAPS: 100.0000 mg | ORAL_CAPSULE | Freq: Three times a day (TID) | ORAL | Status: DC | PRN

## 2015-10-21 MED ORDER — GUAIFENESIN-CODEINE 100-10 MG/5ML PO SOLN: 5.0000 mL | Freq: Four times a day (QID) | ORAL | Status: DC | PRN

## 2015-10-21 MED ORDER — CEFDINIR 300 MG PO CAPS: 300.0000 mg | ORAL_CAPSULE | Freq: Two times a day (BID) | ORAL | Status: DC

## 2015-10-21 MED ORDER — HYDROCODONE-HOMATROPINE 5-1.5 MG/5ML PO SYRP: 5.0000 mL | ORAL_SOLUTION | Freq: Three times a day (TID) | ORAL | Status: DC | PRN

## 2015-10-21 NOTE — Addendum Note (Signed)
Addended by: Tasia Catchings on: 10/21/2015 02:37 PM   Modules accepted: Orders, Medications

## 2015-10-21 NOTE — Telephone Encounter (Signed)
Left message from pt that Rx was ready at the front for office for pickup.

## 2015-10-21 NOTE — Telephone Encounter (Signed)
Dc pt rx of benzonatate

## 2015-10-21 NOTE — Patient Instructions (Addendum)
For allergies continue flonase ans xyzal. Add astelin  For cough rx hycodan  For sinus infection early and rt OM. Rx cefdnir.  Follow up in 7 days or as needed

## 2015-10-21 NOTE — Progress Notes (Signed)
Pre visit review using our clinic review tool, if applicable. No additional management support is needed unless otherwise documented below in the visit note. 
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
## 2015-10-21 NOTE — Telephone Encounter (Signed)
I changed her benzonatate to hycodan at her request. But I forgot to give her the hycodan rx. Unfortunately she needs to pick up rx. From our office. I apologize. For convenience. Maybe get rx filled down stairs.

## 2015-10-21 NOTE — Telephone Encounter (Signed)
[facility] note received via fax  Call:   Date: 10/19/15 Time: 1143   Caller: Cam Moschetto, self Return number: [phone]  Nurse: Owens Shark, RN  Chief Complaint: Sore Throat  Reason for call: Caller states that she has a sore throat, cough, and ear pain since Tuesday. The caller has been using Zyrtec and nasal spray. The caller does not have a fever.  Related visit to physician within the last 2 weeks: No  Guideline: Common Cold; Earache  Disposition: See Physician within 24 Hours  **Pt scheduled for acute OV today at 11am w/ Mackie Pai, PA-C**

## 2015-12-30 ENCOUNTER — Ambulatory Visit (INDEPENDENT_AMBULATORY_CARE_PROVIDER_SITE_OTHER): Admitting: Family Medicine

## 2015-12-30 ENCOUNTER — Encounter: Payer: Self-pay | Admitting: Family Medicine

## 2015-12-30 VITALS — BP 118/82 | HR 92 | Temp 98.4°F | Ht 65.0 in | Wt 189.2 lb

## 2015-12-30 DIAGNOSIS — E785 Hyperlipidemia, unspecified: Secondary | ICD-10-CM

## 2015-12-30 DIAGNOSIS — R0683 Snoring: Secondary | ICD-10-CM | POA: Diagnosis not present

## 2015-12-30 DIAGNOSIS — I10 Essential (primary) hypertension: Secondary | ICD-10-CM

## 2015-12-30 MED ORDER — AMLODIPINE BESYLATE 5 MG PO TABS: 5.0000 mg | ORAL_TABLET | Freq: Every day | ORAL | Status: DC

## 2015-12-30 MED ORDER — HYDROCHLOROTHIAZIDE 25 MG PO TABS: 12.5000 mg | ORAL_TABLET | Freq: Every day | ORAL | Status: DC
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
## 2015-12-30 NOTE — Patient Instructions (Signed)
We will schedule an appointment with the neurologist and call you--- call us if you do not hear anything in a week Please schedule labs in the next few weeks

## 2015-12-30 NOTE — Progress Notes (Signed)
Pre visit review using our clinic review tool, if applicable. No additional management support is needed unless otherwise documented below in the visit note. 

## 2016-01-13 ENCOUNTER — Ambulatory Visit (INDEPENDENT_AMBULATORY_CARE_PROVIDER_SITE_OTHER): Admitting: Neurology

## 2016-01-13 ENCOUNTER — Encounter: Payer: Self-pay | Admitting: Neurology

## 2016-01-13 VITALS — BP 116/78 | HR 72 | Resp 16 | Ht 65.0 in | Wt 187.0 lb

## 2016-01-13 DIAGNOSIS — R0683 Snoring: Secondary | ICD-10-CM | POA: Diagnosis not present

## 2016-01-13 DIAGNOSIS — G2581 Restless legs syndrome: Secondary | ICD-10-CM | POA: Diagnosis not present

## 2016-01-13 DIAGNOSIS — E669 Obesity, unspecified: Secondary | ICD-10-CM

## 2016-01-13 DIAGNOSIS — R519 Headache, unspecified: Secondary | ICD-10-CM

## 2016-01-13 DIAGNOSIS — R51 Headache: Secondary | ICD-10-CM | POA: Diagnosis not present

## 2016-01-13 DIAGNOSIS — G4710 Hypersomnia, unspecified: Secondary | ICD-10-CM

## 2016-01-13 DIAGNOSIS — R351 Nocturia: Secondary | ICD-10-CM
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
## 2016-01-13 NOTE — Patient Instructions (Signed)

## 2016-02-07 ENCOUNTER — Other Ambulatory Visit: Payer: Self-pay | Admitting: Family Medicine

## 2016-02-07 ENCOUNTER — Telehealth: Payer: Self-pay | Admitting: Family Medicine

## 2016-02-07 MED ORDER — TRIAMCINOLONE ACETONIDE 0.1 % EX CREA: 1.0000 "application " | TOPICAL_CREAM | Freq: Two times a day (BID) | CUTANEOUS | Status: DC

## 2016-02-07 NOTE — Telephone Encounter (Signed)
Patient aware Rx faxed.     KP 

## 2016-02-07 NOTE — Telephone Encounter (Signed)
Please advise      KP 

## 2016-02-07 NOTE — Telephone Encounter (Signed)
Sent to pharmacy 

## 2016-02-07 NOTE — Telephone Encounter (Signed)
Relation to [id number] Call back [phone] Pharmacy: [place], [place]. [address] [phone] (Phone) [phone] (Fax)         Reason for call:  Patient requesting triamcinolone cream (KENALOG) 0.1 %  due to a rash on her lower right arm patient states she has a little drop left but rash has been responding well to cream. Requesting Rx please advise

## 2016-03-19 IMAGING — CR DG CHEST 1V PORT
1 series · 1 of 1 positions shown · non-contrast
Comparison: Chest radiograph August 08, 2014

CLINICAL DATA: Abdominal pain for 12 hours with nausea and
vomiting.

EXAM:
PORTABLE CHEST - 1 VIEW

[view not recorded]
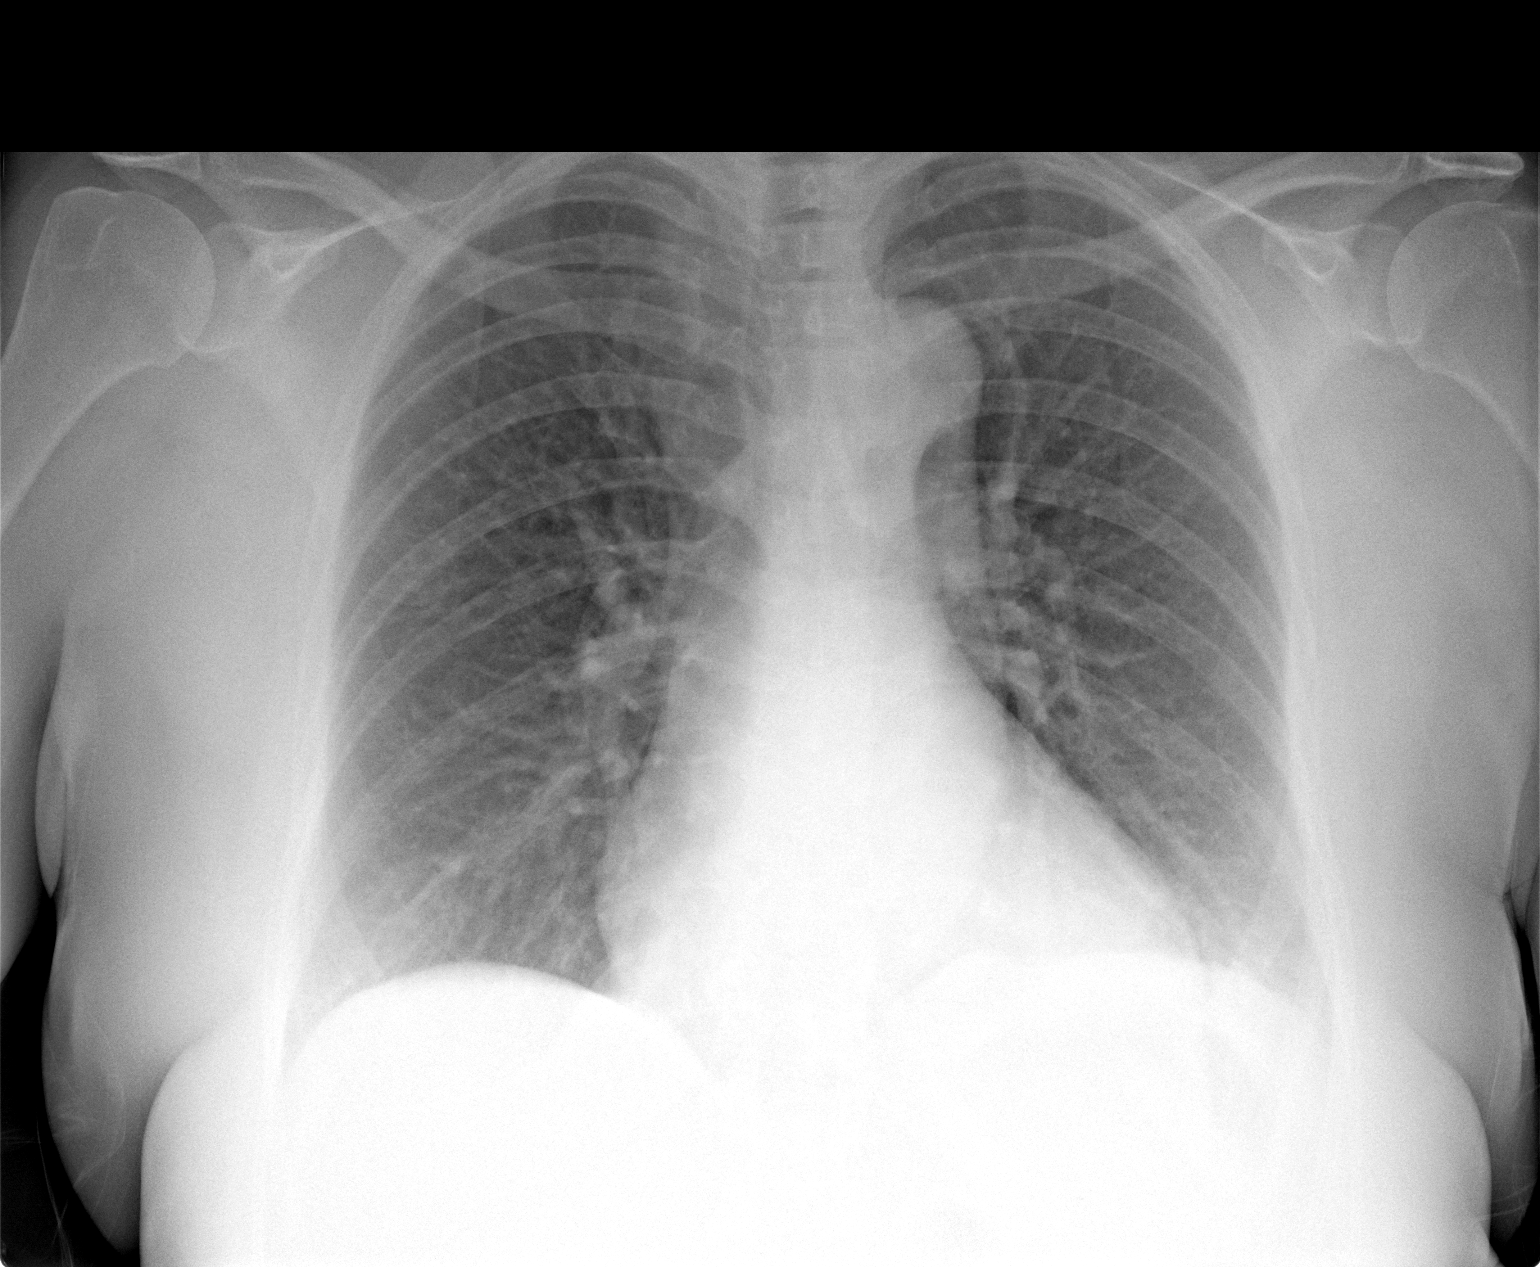

[1 of 1 positions shown; findings below may reference images not displayed]

FINDINGS: Cardiac silhouette is upper limits of normal in size, mediastinal
silhouette is unremarkable. The lungs are clear without pleural
effusions or focal consolidations. Trachea projects midline and
there is no pneumothorax. Soft tissue planes and included osseous
structures are non-suspicious.
IMPRESSION: Borderline cardiomegaly, no acute pulmonary process.

  By: Miska-Matias Behm

## 2016-03-19 IMAGING — CT CT ABD-PELV W/ CM
2 of 5 series · 17 of 46 positions shown, 19 images · IV contrast (APPLIED)
Comparison: None.

CLINICAL DATA: Abdomen pain times 12 hours with nausea.

EXAM:
CT ABDOMEN AND PELVIS WITH CONTRAST
TECHNIQUE: Multidetector CT imaging of the abdomen and pelvis was performed
using the standard protocol following bolus administration of
intravenous contrast.
CONTRAST:  25mL OMNIPAQUE IOHEXOL 300 MG/ML SOLN, 100mL OMNIPAQUE
IOHEXOL 300 MG/ML SOLN

[Series 2: abd/pelvis 5.0 b31f · axial · 0.88mm/px · z∈[+705,+1090]mm · 14 of 87 slices shown, 16 images]
[im 5/87  soft-tissue]
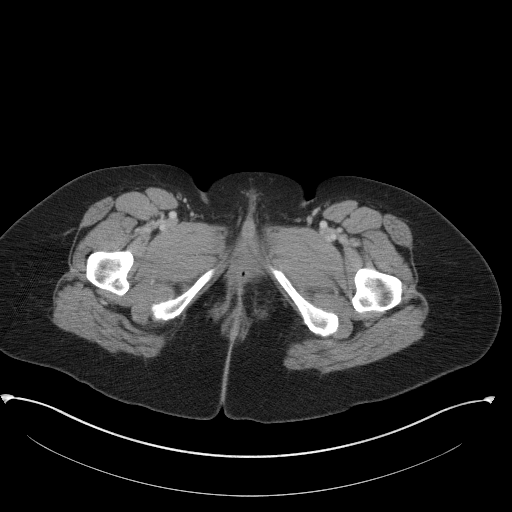
[im 5/87  bone]
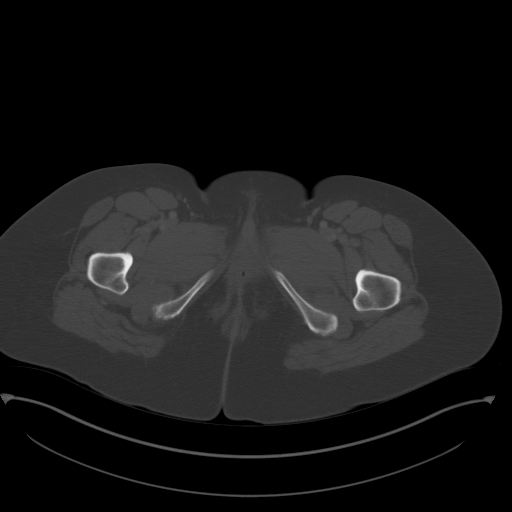
[im 10/87  soft-tissue]
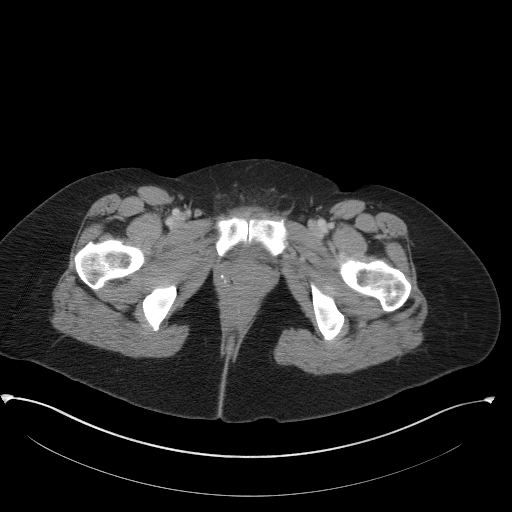
[im 19/87  soft-tissue]
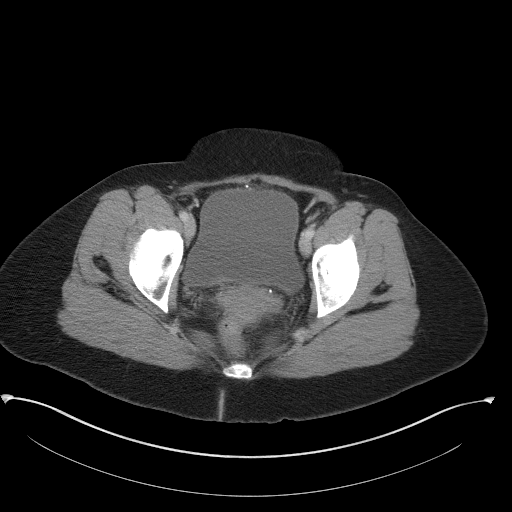
[im 23/87  soft-tissue]
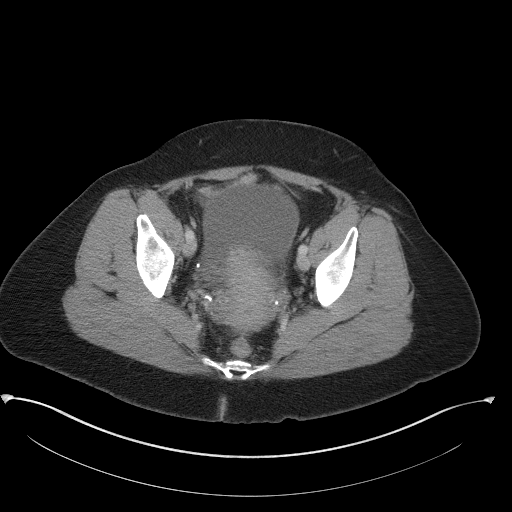
[im 28/87  soft-tissue]
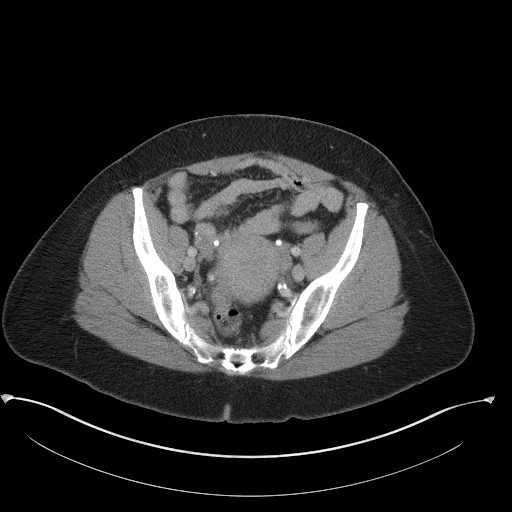
[im 37/87  soft-tissue]
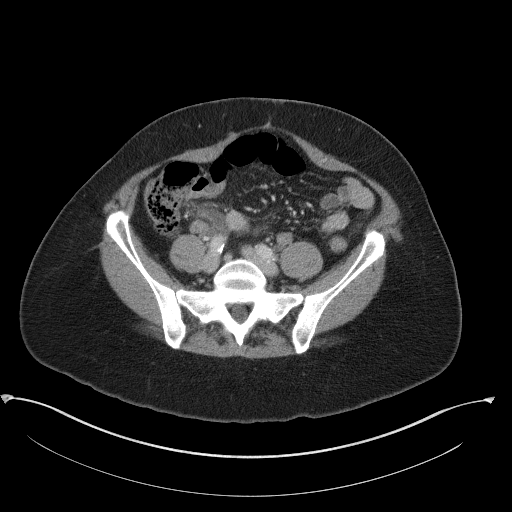
[im 41/87  soft-tissue]
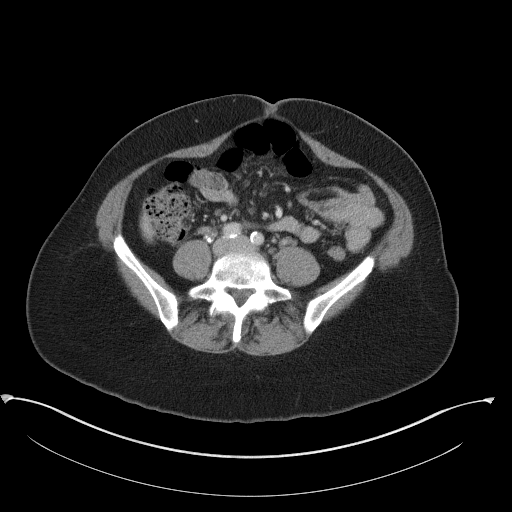
[im 46/87  soft-tissue]
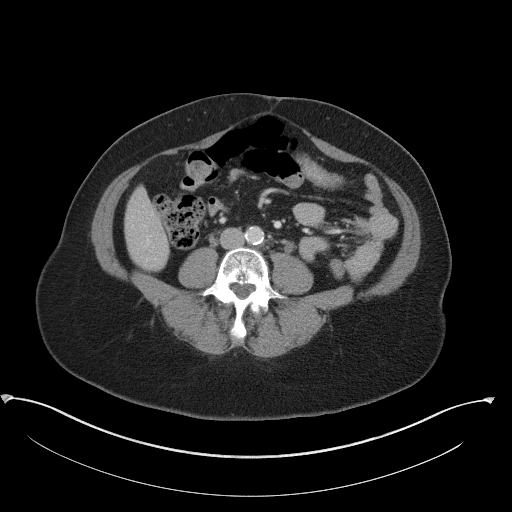
[im 50/87  soft-tissue]
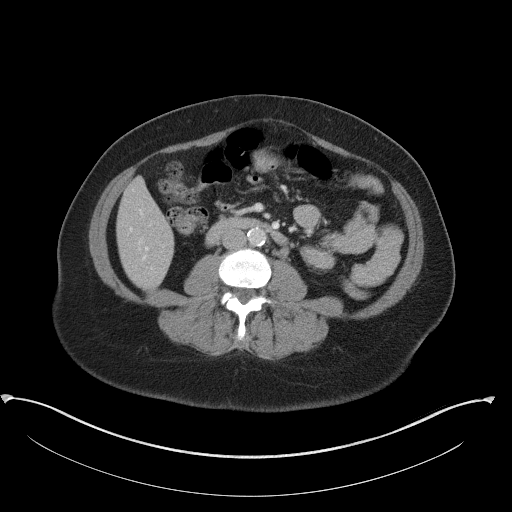
[im 50/87  bone]
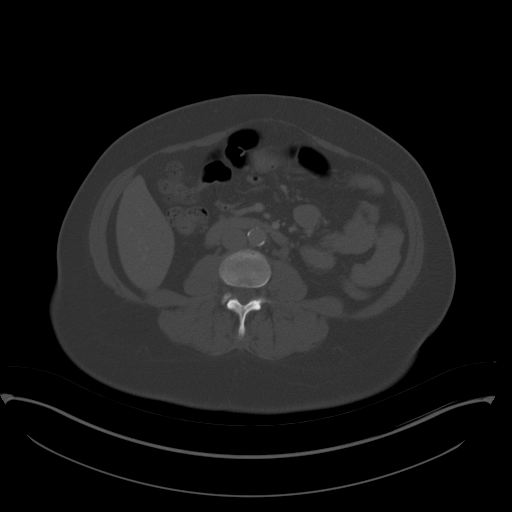
[im 59/87  soft-tissue]
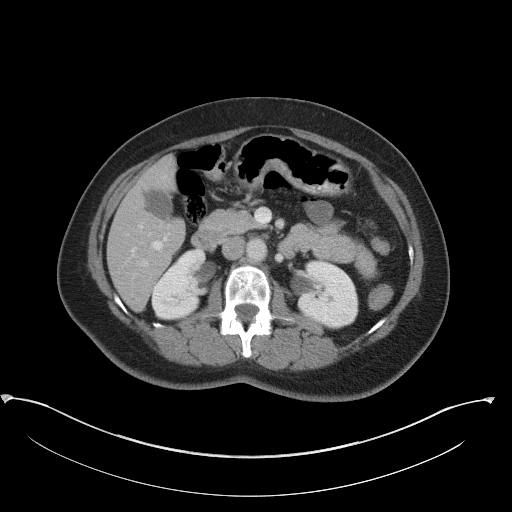
[im 64/87  soft-tissue]
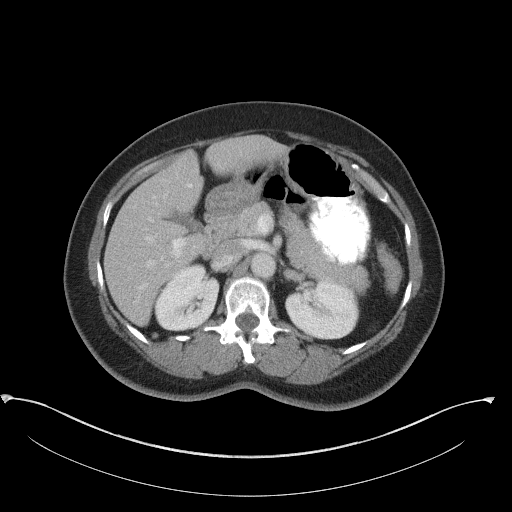
[im 68/87  soft-tissue]
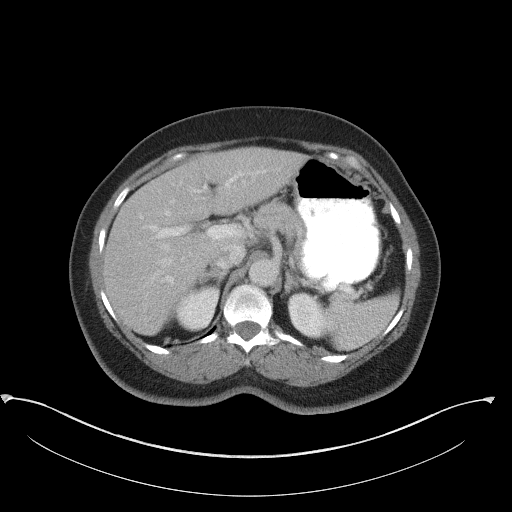
[im 77/87  soft-tissue]
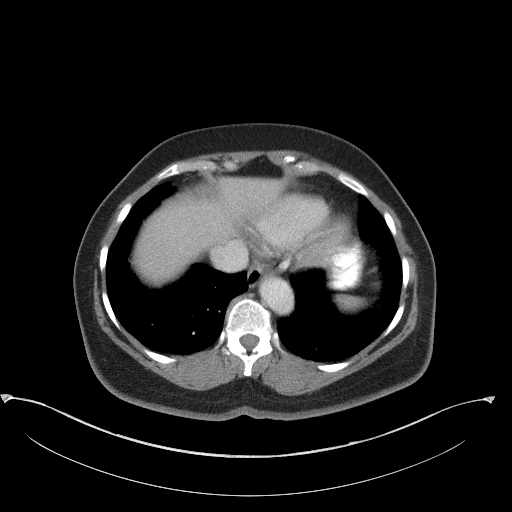
[im 82/87  soft-tissue]
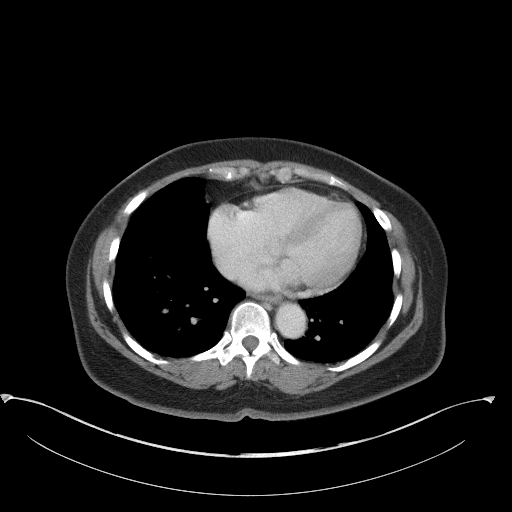

[Series 5: abd/pelvis 3.0 coronal · coronal · 0.84mm/px · 3 of 92 slices shown]
[im 31/92  soft-tissue]
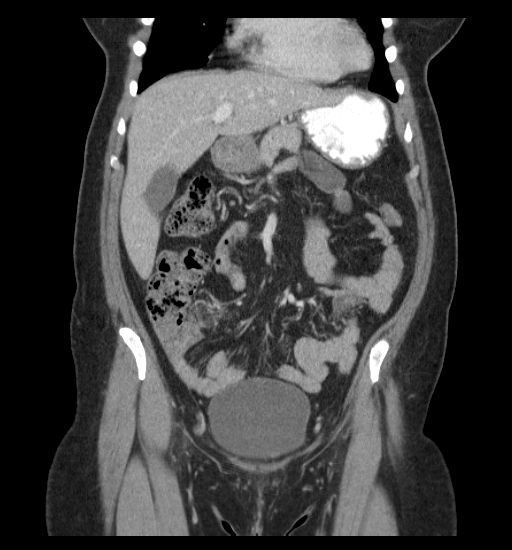
[im 41/92  soft-tissue]
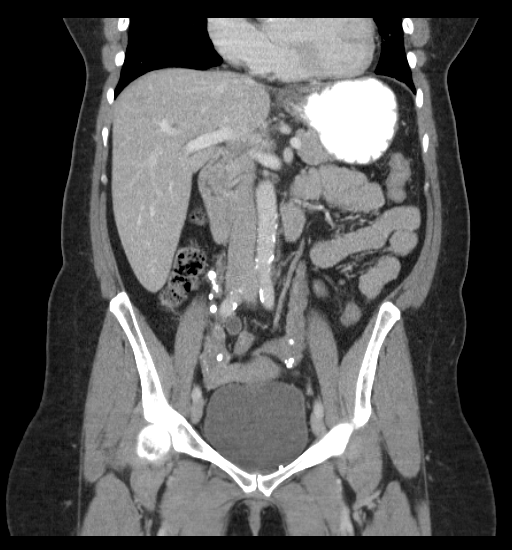
[im 51/92  soft-tissue]
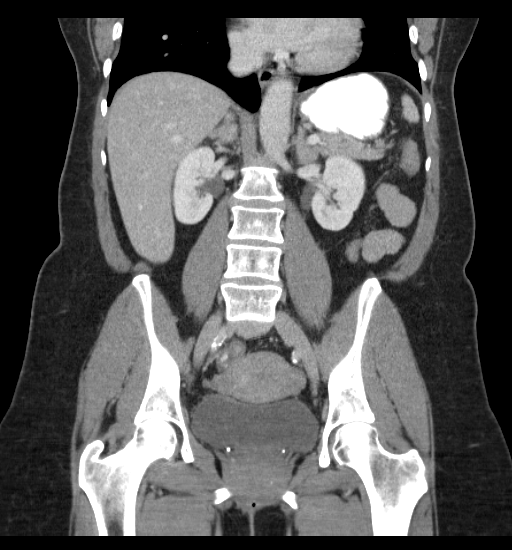

[17 of 46 positions shown; findings below may reference images not displayed]

FINDINGS: There is enlargement and inflammation of the appendix with features
typical of acute appendicitis. There is an appendicolith at the base
of the appendix and another appendicolith at the tip of the
appendix. There is no abscess. There is no extraluminal air.

There are normal appearances of the liver, spleen, pancreas,
adrenals and kidneys. There is a hiatal hernia. There is a ventral
hernia containing unobstructed small bowel at the umbilicus. No
significant abnormalities are evident in the lower chest. No
significant musculoskeletal abnormalities are evident.
IMPRESSION: Acute appendicitis

Critical Value/emergent results were called by telephone at the time
of interpretation on 10/11/2014 at [DATE] to Dr. ZENICHI MINORI , who
verbally acknowledged these results.

## 2016-04-06 ENCOUNTER — Encounter: Payer: Self-pay | Admitting: Gastroenterology

## 2016-04-09 ENCOUNTER — Encounter

## 2016-04-22 ENCOUNTER — Encounter: Payer: Self-pay | Admitting: Family Medicine

## 2016-04-24 ENCOUNTER — Ambulatory Visit (INDEPENDENT_AMBULATORY_CARE_PROVIDER_SITE_OTHER): Admitting: Family Medicine

## 2016-04-24 ENCOUNTER — Encounter: Payer: Self-pay | Admitting: Family Medicine

## 2016-04-24 VITALS — BP 129/89 | HR 56 | Temp 98.3°F | Resp 16 | Ht 65.0 in | Wt 189.0 lb

## 2016-04-24 DIAGNOSIS — I10 Essential (primary) hypertension: Secondary | ICD-10-CM | POA: Diagnosis not present

## 2016-04-24 DIAGNOSIS — R208 Other disturbances of skin sensation: Secondary | ICD-10-CM | POA: Insufficient documentation

## 2016-04-24 DIAGNOSIS — R739 Hyperglycemia, unspecified: Secondary | ICD-10-CM

## 2016-04-24 LAB — LIPID PANEL
CHOLESTEROL: 296 mg/dL — AB (ref 0–200)
HDL: 89.8 mg/dL (ref >39.00)
LDL Cholesterol: 192 mg/dL — ABNORMAL HIGH (ref 0–99)
NONHDL: 206.19
Total CHOL/HDL Ratio: 3
Triglycerides: 71 mg/dL (ref 0.0–149.0)
VLDL: 14.2 mg/dL (ref 0.0–40.0)

## 2016-04-24 LAB — COMPREHENSIVE METABOLIC PANEL
ALBUMIN: 4.1 g/dL (ref 3.5–5.2)
ALT: 29 U/L (ref 0–35)
AST: 24 U/L (ref 0–37)
Alkaline Phosphatase: 61 U/L (ref 39–117)
BILIRUBIN TOTAL: 0.8 mg/dL (ref 0.2–1.2)
BUN: 14 mg/dL (ref 6–23)
CO2: 31 mEq/L (ref 19–32)
CREATININE: 0.87 mg/dL (ref 0.40–1.20)
Calcium: 9.5 mg/dL (ref 8.4–10.5)
Chloride: 100 mEq/L (ref 96–112)
GFR: 84.35 mL/min (ref >60.00)
GLUCOSE: 95 mg/dL (ref 70–99)
Potassium: 3.7 mEq/L (ref 3.5–5.1)
SODIUM: 138 meq/L (ref 135–145)
Total Protein: 7.4 g/dL (ref 6.0–8.3)

## 2016-04-24 LAB — HEMOGLOBIN A1C: Hgb A1c MFr Bld: 6.3 % (ref 4.6–6.5)

## 2016-04-24 NOTE — Progress Notes (Signed)
Pre visit review using our clinic review tool, if applicable. No additional management support is needed unless otherwise documented below in the visit note. 

## 2016-04-24 NOTE — Assessment & Plan Note (Signed)
No bugs seen today or evidence of any con't to keep scalp clean Stop astelin  rto if you notice them again Consider spraying house for bugs
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
## 2016-04-24 NOTE — Patient Instructions (Signed)
No bugs seen in your hair today Stop astelin and just use saline spray Return if it occurs again

## 2016-05-12 ENCOUNTER — Encounter: Payer: Self-pay | Admitting: Family Medicine

## 2016-05-12 NOTE — Telephone Encounter (Signed)
Patient was seen 04/24/2016 for bugs in the scalp, no medication given because no bugs were seen. Please advise    KP

## 2016-05-18 NOTE — Telephone Encounter (Signed)
Dr.Paz took care of it. Thanks     KP

## 2016-05-18 NOTE — Telephone Encounter (Signed)
permethrin top 1 % apply to scalp  X 1

## 2016-06-01 ENCOUNTER — Encounter: Payer: Self-pay | Admitting: Gastroenterology

## 2016-06-03 ENCOUNTER — Encounter: Payer: Self-pay | Admitting: Family Medicine

## 2016-06-03 ENCOUNTER — Telehealth: Payer: Self-pay | Admitting: Family Medicine

## 2016-06-03 ENCOUNTER — Ambulatory Visit (INDEPENDENT_AMBULATORY_CARE_PROVIDER_SITE_OTHER): Admitting: Family Medicine

## 2016-06-03 VITALS — BP 120/70 | HR 54 | Temp 98.0°F | Ht 65.0 in | Wt 184.6 lb

## 2016-06-03 DIAGNOSIS — B80 Enterobiasis: Secondary | ICD-10-CM | POA: Diagnosis not present

## 2016-06-03 MED ORDER — MEBENDAZOLE 100 MG PO CHEW: CHEWABLE_TABLET | ORAL | 0 refills | Status: DC

## 2016-06-03 NOTE — Telephone Encounter (Signed)
Please advise.//AB/CMA 
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
## 2016-06-03 NOTE — Telephone Encounter (Signed)
Patient is calling regarding the prescription of mebendazole (VERMOX) 100 MG chewable tablet. She states that none of the pharmacies she has been to have this medication. She would like a call back. Please advise.   Patient phone:  [phone]

## 2016-06-03 NOTE — Progress Notes (Signed)
Pre visit review using our clinic review tool, if applicable. No additional management support is needed unless otherwise documented below in the visit note. 

## 2016-06-04 MED ORDER — ALBENDAZOLE 200 MG PO TABS: ORAL_TABLET | ORAL | 0 refills | Status: DC

## 2016-06-04 NOTE — Telephone Encounter (Signed)
Alternative called in. Thanks.

## 2016-06-04 NOTE — Telephone Encounter (Signed)
Left message on patients answering machine regarding alternitive medication.

## 2016-06-08 ENCOUNTER — Ambulatory Visit: Payer: Self-pay | Admitting: Family Medicine

## 2016-06-12 ENCOUNTER — Telehealth: Payer: Self-pay | Admitting: Family Medicine

## 2016-06-12 NOTE — Telephone Encounter (Signed)
D/w Adriana Collins at [facility] [place] [facility]----pt was receptive to possible anxiety dx when she d/w her. Pt is coming in Monday for ov

## 2016-06-12 NOTE — Telephone Encounter (Signed)
Message routed to Dr. Carollee Herter.

## 2016-06-12 NOTE — Telephone Encounter (Signed)
Pt need ov to discuss further

## 2016-06-12 NOTE — Telephone Encounter (Signed)
Adriana Collins at [facility] called stating the patient was seen on 06/11/16. Patient has been complaining of itching for two months... Patient brought in samples of the bugs she is seeing and they were just fibers. Doctor said she had no rash no skin changes and doctor states that she has delusions of parasitosis and she states that an antianxiety medication would help. She is expecting a call from Dr. Etter Sjogren- Cheri Rous to go over this further. Please advise.    Adriana Collins Contact: [phone]

## 2016-06-12 NOTE — Telephone Encounter (Signed)
Patient scheduled for 06/15/16 at 2:30pm

## 2016-06-15 ENCOUNTER — Ambulatory Visit (INDEPENDENT_AMBULATORY_CARE_PROVIDER_SITE_OTHER): Admitting: Family Medicine

## 2016-06-15 ENCOUNTER — Encounter: Payer: Self-pay | Admitting: Family Medicine

## 2016-06-15 VITALS — BP 111/73 | HR 68 | Temp 98.1°F | Ht 65.0 in | Wt 184.0 lb

## 2016-06-15 DIAGNOSIS — B89 Unspecified parasitic disease: Secondary | ICD-10-CM

## 2016-06-15 DIAGNOSIS — F411 Generalized anxiety disorder: Secondary | ICD-10-CM | POA: Diagnosis not present

## 2016-06-15 MED ORDER — ALPRAZOLAM 0.25 MG PO TABS: 0.2500 mg | ORAL_TABLET | Freq: Three times a day (TID) | ORAL | 0 refills | Status: DC | PRN

## 2016-06-15 NOTE — Assessment & Plan Note (Signed)
Symptoms resolving with counseling and immediately started to resolve after she met with dermatologist Xanax was refilled for prn use rto prn

## 2016-06-15 NOTE — Progress Notes (Signed)
Pre visit review using our clinic tool,if applicable. No additional management support is needed unless otherwise documented below in the visit note.  
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
## 2016-06-15 NOTE — Patient Instructions (Signed)
Agoraphobia Introduction Agoraphobia is a mental health disorder. It is a type of anxiety or fear. People with agoraphobia fear public places where they may be trapped, helpless, or embarrassed in the event of a panic attack or a loss of control. They often start to avoid the feared situations or insist that another person go with them. Agoraphobia may interfere with normal daily activities and personal relationships. People with severe agoraphobia may become completely homebound and dependent on others for grocery shopping and other errands. Agoraphobia usually begins before age 9, but it can start in the older adult years. People with agoraphobia are at risk for other anxiety disorders, depression, and substance abuse. What are the causes? It is not known exactly what causes agoraphobia. What increases the risk? Agoraphobia is more common in women. People who have panic disorder or have family members with agoraphobia are at higher risk of developing agoraphobia. What are the signs or symptoms? You may have agoraphobia if you have the following symptoms for 6 months or longer:  Intense fear about two or more of the following:  Using public transportation, such as cars, buses, planes, trains, or ships.  Being in open spaces, such as parking lots, shopping malls, or bridges.  Being in enclosed spaces, such as shops, theaters, or elevators.  Standing in line or being in a crowd.  Being outside the home alone.  Fear that is due to thoughts of being unable to escape or get help if certain events occur. The feared event may be a panic attack or panic-like symptoms, such as a racing heart, dizziness, and trouble breathing. In older people, the feared event may be a fall or loss of bowel control.  Reacting to feared situations by:  Avoiding them.  Requiring the presence of a companion.  Enduring them with intense fear or anxiety.  Fear or anxiety that is out of proportion to the actual  danger that is posed by the event and the situation. How is this diagnosed? Agoraphobia may be diagnosed by your health care provider. You will be asked questions about your fears and how they have affected you. You may be asked about your medical history and your use of medicines, alcohol, or drugs. Your health care provider may do a physical exam and order lab tests or other studies to rule out a medical condition. You may also be referred to a mental health specialist. How is this treated? Treatment usually includes a combination of counseling and medicines.  Counseling or talk therapy. Talk therapy is provided by mental health specialists. The following forms of talk therapy can be especially helpful:  Cognitive therapy. Cognitive therapy helps you to recognize and change unrealistic thoughts and beliefs that contribute to your fears.  Exposure therapy. Exposure therapy helps you to face and overcome your fears in a relaxed state and a safe environment.  Medicines. The following types of medicines may be helpful:  Antidepressants. Antidepressants are believed to affect certain chemicals in your brain. They can decrease general levels of anxiety and can help to prevent panic attacks.  Benzodiazepines. These medicines block feelings of anxiety and panic. They are very effective and act more quickly than antidepressants, but they are highly addictive. These medicines are recommended only for short-term use.  Beta blockers. Beta blockers can reduce physical symptoms of anxiety, such as a racing heart, sweating, and tremor. They may help you to feel less tense and anxious. Follow these instructions at home:  Keep all follow-up visits as directed  by your health care provider. This is important.  Take all medicines only as directed by your health care provider.  Try to exercise, eat a healthy diet, and get plenty of sleep.  Do not drink alcohol.  Do not use illegal drugs. Where to find  more information: For more information, visit the website of the Anxiety and Depression Association of [place] (ADAA): https://www.clark.net/ Contact a health care provider if:  Your fear or anxiety gets worse.  You have new fears or anxieties. Get help right away if:  You have serious thoughts about hurting yourself or someone else.  You have trouble breathing or have chest pain. This information is not intended to replace advice given to you by your health care provider. Make sure you discuss any questions you have with your health care provider. Document Released: 12/03/2010 Document Revised: 12/19/2015 Document Reviewed: 11/13/2013  2017 Elsevier

## 2016-07-14 ENCOUNTER — Other Ambulatory Visit: Payer: Self-pay | Admitting: Family Medicine

## 2016-07-14 DIAGNOSIS — I10 Essential (primary) hypertension: Secondary | ICD-10-CM

## 2016-07-15 ENCOUNTER — Ambulatory Visit (AMBULATORY_SURGERY_CENTER): Payer: Self-pay | Admitting: *Deleted

## 2016-07-15 VITALS — Ht 65.0 in | Wt 187.0 lb

## 2016-07-15 DIAGNOSIS — Z86010 Personal history of colonic polyps: Secondary | ICD-10-CM

## 2016-07-15 DIAGNOSIS — Z800 Family history of malignant neoplasm of digestive organs: Secondary | ICD-10-CM

## 2016-07-15 MED ORDER — NA SULFATE-K SULFATE-MG SULF 17.5-3.13-1.6 GM/177ML PO SOLN: ORAL | 0 refills | Status: DC

## 2016-07-15 NOTE — Progress Notes (Signed)
Patient denies any allergies to eggs or soy. Patient denies any problems with anesthesia/sedation. Patient denies any oxygen use at home and does not take any diet/weight loss medications. EMMI education assisgned to patient on colonoscopy, this was explained and instructions given to patient. 

## 2016-07-24 ENCOUNTER — Encounter: Payer: Self-pay | Admitting: Family Medicine

## 2016-07-24 ENCOUNTER — Ambulatory Visit (INDEPENDENT_AMBULATORY_CARE_PROVIDER_SITE_OTHER): Admitting: Family Medicine

## 2016-07-24 ENCOUNTER — Other Ambulatory Visit: Payer: Self-pay | Admitting: Family Medicine

## 2016-07-24 VITALS — BP 138/89 | HR 65 | Temp 98.4°F | Resp 16 | Ht 65.0 in | Wt 181.8 lb

## 2016-07-24 DIAGNOSIS — Z1239 Encounter for other screening for malignant neoplasm of breast: Secondary | ICD-10-CM

## 2016-07-24 DIAGNOSIS — Z0000 Encounter for general adult medical examination without abnormal findings: Secondary | ICD-10-CM

## 2016-07-24 DIAGNOSIS — Z1231 Encounter for screening mammogram for malignant neoplasm of breast: Secondary | ICD-10-CM

## 2016-07-24 DIAGNOSIS — E1310 Other specified diabetes mellitus with ketoacidosis without coma: Secondary | ICD-10-CM

## 2016-07-24 DIAGNOSIS — J301 Allergic rhinitis due to pollen: Secondary | ICD-10-CM | POA: Diagnosis not present

## 2016-07-24 DIAGNOSIS — E785 Hyperlipidemia, unspecified: Secondary | ICD-10-CM

## 2016-07-24 DIAGNOSIS — J0110 Acute frontal sinusitis, unspecified: Secondary | ICD-10-CM

## 2016-07-24 DIAGNOSIS — I10 Essential (primary) hypertension: Secondary | ICD-10-CM

## 2016-07-24 DIAGNOSIS — E1110 Type 2 diabetes mellitus with ketoacidosis without coma: Secondary | ICD-10-CM

## 2016-07-24 DIAGNOSIS — Z1159 Encounter for screening for other viral diseases: Secondary | ICD-10-CM

## 2016-07-24 LAB — URINALYSIS, ROUTINE W REFLEX MICROSCOPIC
Bilirubin Urine: NEGATIVE
Hgb urine dipstick: NEGATIVE
KETONES UR: NEGATIVE
Leukocytes, UA: NEGATIVE
Nitrite: NEGATIVE
PH: 6 (ref 5.0–8.0)
RBC / HPF: NONE SEEN (ref 0–?)
SPECIFIC GRAVITY, URINE: 1.025 (ref 1.000–1.030)
TOTAL PROTEIN, URINE-UPE24: NEGATIVE
URINE GLUCOSE: NEGATIVE
UROBILINOGEN UA: 0.2 (ref 0.0–1.0)
WBC, UA: NONE SEEN (ref 0–?)

## 2016-07-24 LAB — CBC WITH DIFFERENTIAL/PLATELET
HCT: 40.3 % (ref 36.0–46.0)
HEMOGLOBIN: 14 g/dL (ref 12.0–15.0)
MCHC: 34.7 g/dL (ref 30.0–36.0)
MCV: 89.4 fl (ref 78.0–100.0)
PLATELETS: 301 10*3/uL (ref 150.0–400.0)
RBC: 4.51 Mil/uL (ref 3.87–5.11)
RDW: 14 % (ref 11.5–15.5)
WBC: 4.2 10*3/uL (ref 4.0–10.5)

## 2016-07-24 LAB — MICROALBUMIN / CREATININE URINE RATIO
Creatinine,U: 268 mg/dL
Microalb Creat Ratio: 1 mg/g (ref 0.0–30.0)
Microalb, Ur: 2.7 mg/dL — ABNORMAL HIGH (ref 0.0–1.9)

## 2016-07-24 LAB — COMPREHENSIVE METABOLIC PANEL
ALT: 48 U/L — AB (ref 0–35)
AST: 30 U/L (ref 0–37)
Albumin: 4.6 g/dL (ref 3.5–5.2)
Alkaline Phosphatase: 64 U/L (ref 39–117)
BILIRUBIN TOTAL: 1.1 mg/dL (ref 0.2–1.2)
BUN: 14 mg/dL (ref 6–23)
CO2: 32 meq/L (ref 19–32)
Calcium: 9.7 mg/dL (ref 8.4–10.5)
Chloride: 101 mEq/L (ref 96–112)
Creatinine, Ser: 0.82 mg/dL (ref 0.40–1.20)
GFR: 90.24 mL/min (ref >60.00)
GLUCOSE: 99 mg/dL (ref 70–99)
Potassium: 3.4 mEq/L — ABNORMAL LOW (ref 3.5–5.1)
SODIUM: 140 meq/L (ref 135–145)
Total Protein: 7.6 g/dL (ref 6.0–8.3)

## 2016-07-24 LAB — LIPID PANEL
CHOL/HDL RATIO: 3
Cholesterol: 324 mg/dL — ABNORMAL HIGH (ref 0–200)
HDL: 98.8 mg/dL (ref >39.00)
LDL Cholesterol: 212 mg/dL — ABNORMAL HIGH (ref 0–99)
NONHDL: 224.7
Triglycerides: 62 mg/dL (ref 0.0–149.0)
VLDL: 12.4 mg/dL (ref 0.0–40.0)

## 2016-07-24 LAB — TSH: TSH: 0.67 u[IU]/mL (ref 0.35–4.50)

## 2016-07-24 LAB — HEMOGLOBIN A1C: Hgb A1c MFr Bld: 6.1 % (ref 4.6–6.5)

## 2016-07-24 MED ORDER — AMLODIPINE BESYLATE 5 MG PO TABS: 5.0000 mg | ORAL_TABLET | Freq: Every day | ORAL | 1 refills | Status: DC

## 2016-07-24 MED ORDER — HYDROCHLOROTHIAZIDE 25 MG PO TABS: 12.5000 mg | ORAL_TABLET | Freq: Every day | ORAL | 3 refills | Status: DC

## 2016-07-24 MED ORDER — LEVOCETIRIZINE DIHYDROCHLORIDE 5 MG PO TABS: 5.0000 mg | ORAL_TABLET | Freq: Every evening | ORAL | 5 refills | Status: DC

## 2016-07-24 MED ORDER — AZELASTINE HCL 0.1 % NA SOLN: 2.0000 | Freq: Two times a day (BID) | NASAL | 5 refills | Status: DC

## 2016-07-24 MED ORDER — FLUTICASONE PROPIONATE 50 MCG/ACT NA SUSP: 2.0000 | Freq: Every day | NASAL | 6 refills | Status: DC
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
## 2016-07-24 NOTE — Progress Notes (Signed)
Pre visit review using our clinic review tool, if applicable. No additional management support is needed unless otherwise documented below in the visit note. 

## 2016-07-24 NOTE — Patient Instructions (Addendum)

## 2016-07-25 LAB — HEPATITIS C ANTIBODY: HCV AB: NEGATIVE

## 2016-07-26 ENCOUNTER — Other Ambulatory Visit: Payer: Self-pay | Admitting: Family Medicine

## 2016-07-26 DIAGNOSIS — E785 Hyperlipidemia, unspecified: Secondary | ICD-10-CM

## 2016-08-06 ENCOUNTER — Encounter: Payer: Self-pay | Admitting: Gastroenterology

## 2016-08-06 ENCOUNTER — Ambulatory Visit (AMBULATORY_SURGERY_CENTER): Admitting: Gastroenterology

## 2016-08-06 VITALS — BP 113/72 | HR 58 | Temp 97.3°F | Resp 17 | Ht 65.0 in | Wt 187.0 lb

## 2016-08-06 DIAGNOSIS — Z800 Family history of malignant neoplasm of digestive organs: Secondary | ICD-10-CM

## 2016-08-06 DIAGNOSIS — Z86010 Personal history of colonic polyps: Secondary | ICD-10-CM

## 2016-08-06 MED ORDER — SODIUM CHLORIDE 0.9 % IV SOLN: 500.0000 mL | INTRAVENOUS | Status: DC

## 2016-08-06 NOTE — Patient Instructions (Signed)
YOU HAD AN ENDOSCOPIC PROCEDURE TODAY AT THE [facility]:   Refer to the procedure report that was given to you for any specific questions about what was found during the examination.  If the procedure report does not answer your questions, please call your gastroenterologist to clarify.  If you requested that your care partner not be given the details of your procedure findings, then the procedure report has been included in a sealed envelope for you to review at your convenience later.  YOU SHOULD EXPECT: Some feelings of bloating in the abdomen. Passage of more gas than usual.  Walking can help get rid of the air that was put into your GI tract during the procedure and reduce the bloating. If you had a lower endoscopy (such as a colonoscopy or flexible sigmoidoscopy) you may notice spotting of blood in your stool or on the toilet paper. If you underwent a bowel prep for your procedure, you may not have a normal bowel movement for a few days.  Please Note:  You might notice some irritation and congestion in your nose or some drainage.  This is from the oxygen used during your procedure.  There is no need for concern and it should clear up in a day or so.  SYMPTOMS TO REPORT IMMEDIATELY:   Following lower endoscopy (colonoscopy or flexible sigmoidoscopy):  Excessive amounts of blood in the stool  Significant tenderness or worsening of abdominal pains  Swelling of the abdomen that is new, acute  Fever of 100F or higher   For urgent or emergent issues, a gastroenterologist can be reached at any hour by calling [phone]. Please read all handouts given to you by your recovery nurse.   DIET:  We do recommend a small meal at first, but then you may proceed to your regular diet.  Drink plenty of fluids but you should avoid alcoholic beverages for 24 hours.  ACTIVITY:  You should plan to take it easy for the rest of today and you should NOT DRIVE or use heavy machinery until  tomorrow (because of the sedation medicines used during the test).    FOLLOW UP: Our staff will call the number listed on your records the next business day following your procedure to check on you and address any questions or concerns that you may have regarding the information given to you following your procedure. If we do not reach you, we will leave a message.  However, if you are feeling well and you are not experiencing any problems, there is no need to return our call.  We will assume that you have returned to your regular daily activities without incident.  If any biopsies were taken you will be contacted by phone or by letter within the next 1-3 weeks.  Please call us at [phone] if you have not heard about the biopsies in 3 weeks.    SIGNATURES/CONFIDENTIALITY: You and/or your care partner have signed paperwork which will be entered into your electronic medical record.  These signatures attest to the fact that that the information above on your After Visit Summary has been reviewed and is understood.  Full responsibility of the confidentiality of this discharge information lies with you and/or your care-partner.  Thank you for letting us take care of your healthcare needs today. 

## 2016-08-06 NOTE — Op Note (Signed)
[place] Patient Name: Adriana Collins Procedure Date: 08/06/2016 9:56 AM MRN: [id number] Endoscopist: Ladene Artist , MD Age: 66 Referring MD:  Date of Birth: [birth year] Gender: Female Account #: [id number] Procedure:                Colonoscopy Indications:              Surveillance: Personal history of adenomatous                            polyps on last colonoscopy > 5 years ago, Screening                            in patient at increased risk: Family history of                            1st-degree relative with colorectal cancer before                            age 73 years Medicines:                Monitored Anesthesia Care Procedure:                Pre-Anesthesia Assessment:                           - Prior to the procedure, a History and Physical                            was performed, and patient medications and                            allergies were reviewed. The patient's tolerance of                            previous anesthesia was also reviewed. The risks                            and benefits of the procedure and the sedation                            options and risks were discussed with the patient.                            All questions were answered, and informed consent                            was obtained. Prior Anticoagulants: The patient has                            taken no previous anticoagulant or antiplatelet                            agents. ASA Grade Assessment: II - A patient with  mild systemic disease. After reviewing the risks                            and benefits, the patient was deemed in                            satisfactory condition to undergo the procedure.                           After obtaining informed consent, the colonoscope                            was passed under direct vision. Throughout the                            procedure, the patient's blood pressure, pulse,  and                            oxygen saturations were monitored continuously. The                            Model PCF-H190L [id number]) scope was introduced                            through the anus and advanced to the the cecum,                            identified by appendiceal orifice and ileocecal                            valve. The ileocecal valve, appendiceal orifice,                            and rectum were photographed. The quality of the                            bowel preparation was excellent. The colonoscopy                            was performed without difficulty. The patient                            tolerated the procedure well. Scope In: 10:01:03 AM Scope Out: 10:11:18 AM Scope Withdrawal Time: 0 hours 8 minutes 11 seconds  Total Procedure Duration: 0 hours 10 minutes 15 seconds  Findings:                 The perianal and digital rectal examinations were                            normal.                           Internal hemorrhoids were found during  retroflexion. The hemorrhoids were small and Grade                            I (internal hemorrhoids that do not prolapse).                           The exam was otherwise without abnormality on                            direct and retroflexion views. Complications:            No immediate complications. Estimated blood loss:                            None. Estimated Blood Loss:     Estimated blood loss: none. Impression:               - Internal hemorrhoids.                           - The examination was otherwise normal on direct                            and retroflexion views.                           - No specimens collected. Recommendation:           - Repeat colonoscopy in 5 years for surveillance.                           - Patient has a contact number available for                            emergencies. The signs and symptoms of potential                             delayed complications were discussed with the                            patient. Return to normal activities tomorrow.                            Written discharge instructions were provided to the                            patient.                           - Resume previous diet.                           - Continue present medications.                           - Await pathology results. Ladene Artist, MD 08/06/2016 10:15:51 AM This report has been signed electronically.

## 2016-08-06 NOTE — Progress Notes (Signed)
Spontaneous respirations throughout. VSS. Resting comfortably. To PACU on room air. Report to  Sara RN. 

## 2016-08-07 ENCOUNTER — Telehealth: Payer: Self-pay | Admitting: *Deleted

## 2016-08-07 NOTE — Telephone Encounter (Signed)
No answer, left message to call if questions or concerns. 

## 2016-08-26 ENCOUNTER — Ambulatory Visit
Admission: RE | Admit: 2016-08-26 | Discharge: 2016-08-26 | Disposition: A | Discharge status: Home / Self Care | Source: Ambulatory Visit | Attending: Family Medicine | Admitting: Family Medicine

## 2016-08-26 DIAGNOSIS — Z1239 Encounter for other screening for malignant neoplasm of breast: Secondary | ICD-10-CM

## 2016-10-19 ENCOUNTER — Other Ambulatory Visit: Payer: Self-pay | Admitting: Family Medicine

## 2016-10-19 ENCOUNTER — Ambulatory Visit: Payer: Self-pay | Admitting: Neurology

## 2016-10-19 DIAGNOSIS — J301 Allergic rhinitis due to pollen: Secondary | ICD-10-CM

## 2016-10-19 DIAGNOSIS — J0110 Acute frontal sinusitis, unspecified: Secondary | ICD-10-CM

## 2016-10-19 MED ORDER — AZELASTINE HCL 0.1 % NA SOLN: 2.0000 | Freq: Two times a day (BID) | NASAL | 1 refills | Status: DC

## 2016-10-19 MED ORDER — FLUTICASONE PROPIONATE 50 MCG/ACT NA SUSP: 2.0000 | Freq: Every day | NASAL | 1 refills | Status: DC

## 2016-10-22 ENCOUNTER — Other Ambulatory Visit: Payer: Self-pay | Admitting: *Deleted

## 2016-10-22 DIAGNOSIS — J301 Allergic rhinitis due to pollen: Secondary | ICD-10-CM

## 2016-10-22 DIAGNOSIS — J0110 Acute frontal sinusitis, unspecified: Secondary | ICD-10-CM

## 2016-10-22 MED ORDER — AZELASTINE HCL 0.1 % NA SOLN: 2.0000 | Freq: Two times a day (BID) | NASAL | 1 refills | Status: DC

## 2016-10-22 MED ORDER — FLUTICASONE PROPIONATE 50 MCG/ACT NA SUSP: 2.0000 | Freq: Every day | NASAL | 1 refills | Status: DC

## 2017-01-20 ENCOUNTER — Ambulatory Visit (INDEPENDENT_AMBULATORY_CARE_PROVIDER_SITE_OTHER): Admitting: Family Medicine

## 2017-01-20 ENCOUNTER — Ambulatory Visit: Payer: Self-pay | Admitting: Family

## 2017-01-20 ENCOUNTER — Encounter: Payer: Self-pay | Admitting: Family Medicine

## 2017-01-20 VITALS — BP 106/60 | HR 65 | Temp 98.2°F | Ht 65.0 in | Wt 190.8 lb

## 2017-01-20 DIAGNOSIS — R300 Dysuria: Secondary | ICD-10-CM

## 2017-01-20 LAB — POC URINALSYSI DIPSTICK (AUTOMATED)
BILIRUBIN UA: NEGATIVE
Blood, UA: NEGATIVE
GLUCOSE UA: NEGATIVE
Ketones, UA: NEGATIVE
LEUKOCYTES UA: NEGATIVE
Nitrite, UA: NEGATIVE
Protein, UA: NEGATIVE
Urobilinogen, UA: 0.2 E.U./dL
pH, UA: 6 (ref 5.0–8.0)

## 2017-01-20 NOTE — Patient Instructions (Signed)
Consider a laxative for bowel movements like Miralax if needed. Constipation can be a cause for increased urinary frequency and sometimes pain.  We will culture your urine today. If it shows an infection, we will let you know and call in an antibiotic.  Let us know if anything changes.
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
## 2017-01-22 LAB — URINE CULTURE

## 2017-02-23 ENCOUNTER — Encounter: Payer: Self-pay | Admitting: Family Medicine

## 2017-02-23 ENCOUNTER — Ambulatory Visit (INDEPENDENT_AMBULATORY_CARE_PROVIDER_SITE_OTHER): Admitting: Family Medicine

## 2017-02-23 VITALS — BP 132/84 | HR 51 | Temp 97.6°F | Ht 65.0 in | Wt 193.1 lb

## 2017-02-23 DIAGNOSIS — J0110 Acute frontal sinusitis, unspecified: Secondary | ICD-10-CM | POA: Diagnosis not present

## 2017-02-23 DIAGNOSIS — E785 Hyperlipidemia, unspecified: Secondary | ICD-10-CM

## 2017-02-23 DIAGNOSIS — I10 Essential (primary) hypertension: Secondary | ICD-10-CM

## 2017-02-23 DIAGNOSIS — J301 Allergic rhinitis due to pollen: Secondary | ICD-10-CM | POA: Diagnosis not present

## 2017-02-23 LAB — LIPID PANEL
CHOL/HDL RATIO: 3
Cholesterol: 278 mg/dL — ABNORMAL HIGH (ref 0–200)
HDL: 102 mg/dL (ref >39.00)
LDL CALC: 164 mg/dL — AB (ref 0–99)
NONHDL: 176.1
Triglycerides: 62 mg/dL (ref 0.0–149.0)
VLDL: 12.4 mg/dL (ref 0.0–40.0)

## 2017-02-23 LAB — COMPREHENSIVE METABOLIC PANEL
ALBUMIN: 4.4 g/dL (ref 3.5–5.2)
ALT: 34 U/L (ref 0–35)
AST: 26 U/L (ref 0–37)
Alkaline Phosphatase: 59 U/L (ref 39–117)
BUN: 12 mg/dL (ref 6–23)
CHLORIDE: 99 meq/L (ref 96–112)
CO2: 31 mEq/L (ref 19–32)
Calcium: 9.5 mg/dL (ref 8.4–10.5)
Creatinine, Ser: 0.77 mg/dL (ref 0.40–1.20)
GFR: 96.86 mL/min (ref >60.00)
GLUCOSE: 120 mg/dL — AB (ref 70–99)
POTASSIUM: 3.4 meq/L — AB (ref 3.5–5.1)
SODIUM: 137 meq/L (ref 135–145)
Total Bilirubin: 1 mg/dL (ref 0.2–1.2)
Total Protein: 7.5 g/dL (ref 6.0–8.3)

## 2017-02-23 LAB — HEMOGLOBIN A1C: HEMOGLOBIN A1C: 6.5 % (ref 4.6–6.5)

## 2017-02-23 MED ORDER — LEVOCETIRIZINE DIHYDROCHLORIDE 5 MG PO TABS: 5.0000 mg | ORAL_TABLET | Freq: Every evening | ORAL | 3 refills | Status: DC

## 2017-02-23 MED ORDER — AZELASTINE HCL 0.1 % NA SOLN: 2.0000 | Freq: Two times a day (BID) | NASAL | 5 refills | Status: DC

## 2017-02-23 MED ORDER — FLUTICASONE PROPIONATE 50 MCG/ACT NA SUSP: 2.0000 | Freq: Every day | NASAL | 5 refills | Status: DC

## 2017-02-23 MED ORDER — AMLODIPINE BESYLATE 5 MG PO TABS: 5.0000 mg | ORAL_TABLET | Freq: Every day | ORAL | 1 refills | Status: DC

## 2017-02-23 MED ORDER — AZELASTINE HCL 0.1 % NA SOLN
2.0000 | Freq: Two times a day (BID) | NASAL | 5 refills | Status: DC
Start: 2017-02-23 — End: 2017-02-23

## 2017-02-23 NOTE — Patient Instructions (Signed)
Nasal Polyps Nasal polyps are growths that form in the nose. Irritation and swelling (inflammation) in the nose or sinus openings can lead to changes in the tissue (mucosa) that lines these areas. Long-term inflammation causes the mucosa to balloon out or grow into a polyp. The polyp fills with watery mucus. Nasal polyps look like moist, gray grapes in the nose.Nasal polyps are not cancer. They do not increase your risk of cancer. You may have one nasal polyp or more than one. They can be small or large. In most cases, they form in both sides of the nose. Polyps can make it hard to breathe through your nose (nasal obstruction). What are the causes? The exact cause of nasal polyps is not known. What increases the risk? You are more likely to develop nasal polyps if you:  Have a family history of the condition.  Have a disease that causes inflammation in your nose or sinuses.  Have another condition that affects your nose or sinuses, such as: ? Nasal allergies (allergic rhinitis). ? Long-term nasal obstruction (nonallergic rhinitis). ? Asthma. ? Nasal or sinus infection, especially fungal infection.  Are female.  Are older than 65 years of age.  Have a sensitivity to aspirin or alcohol.  Smoke.  Have a disease passed down through families that causes increased production of thick mucus (cystic fibrosis).  What are the signs or symptoms? Symptoms depend on the size of the polyps. Small polyps may cause few symptoms. When symptoms develop, they may include:  Nasal obstruction.  Decreased senses of smell and taste.  Runny nose.  The feeling of mucus going down the back of the throat (postnasal drip).  Headache, face pain, or sinus pressure.  Snoring.  Frequent nasal or sinus infections.  Itchy eyes.  How is this diagnosed?  Nasal polyps may be diagnosed based on your symptoms, your medical history, and a physical exam of the inside of your nose. You may also have tests,  such as:  Imaging studies such as a CT scan or MRI to see how large your polyps are and if any are in your sinuses.  Skin or blood tests to find out if your polyps may be caused by allergies.  Washings or swabs taken from your nose to test for inflammation or infection.  How is this treated? Small nasal polyps that are not causing symptoms may not need treatment. For large polyps that are causing symptoms, the goal of treatment is to reduce nasal obstruction and improve sinus drainage. Treatment may include:  A medicine to reduce inflammation (steroid). This is usually the first treatment. You may have to take steroids for a short or long period of time. Short-term steroids are usually taken as pills. Long-term steroid treatment is usually in the form of nose drops or spray.  Medicines to treat an underlying condition, such as allergies, asthma, or infection.  Surgery. This may be needed to remove nasal polyps if medicine does not help.  Follow these instructions at home:  Take or use over-the-counter and prescription medicines only as told by your health care provider. Do not stop using your medicine even if you start to feel better.  Use solutions to wash or rinse out the inside of your nose (nasal washes or irrigations) as told by your health care provider.  Do not take medicines that contain aspirin if they make your symptoms worse.  Do not drink alcohol if it makes your symptoms worse.  Do not use any tobacco products, such   as cigarettes, chewing tobacco, and e-cigarettes. If you need help quitting, ask your health care provider.  Keep all follow-up visits as told by your health care provider. This is important. Contact a health care provider if:  Your condition does not get better or it gets worse at home after treatment.  You have a fever.  You have headaches or pain in your face that is new or is getting worse.  You have a bloody nose. This information is not intended  to replace advice given to you by your health care provider. Make sure you discuss any questions you have with your health care provider. Document Released: 11/04/2015 Document Revised: 12/19/2015 Document Reviewed: 10/03/2015 Elsevier Interactive Patient Education  2018 [employer]  
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
## 2017-02-23 NOTE — Progress Notes (Signed)
Pre visit review using our clinic review tool, if applicable. No additional management support is needed unless otherwise documented below in the visit note. 

## 2017-03-08 ENCOUNTER — Ambulatory Visit (HOSPITAL_BASED_OUTPATIENT_CLINIC_OR_DEPARTMENT_OTHER)
Admission: RE | Admit: 2017-03-08 | Discharge: 2017-03-08 | Disposition: A | Discharge status: Home / Self Care | Source: Ambulatory Visit | Attending: Family Medicine | Admitting: Family Medicine

## 2017-03-08 ENCOUNTER — Encounter: Payer: Self-pay | Admitting: Family Medicine

## 2017-03-08 ENCOUNTER — Ambulatory Visit (INDEPENDENT_AMBULATORY_CARE_PROVIDER_SITE_OTHER): Admitting: Family Medicine

## 2017-03-08 VITALS — BP 108/82 | HR 57 | Temp 98.0°F | Ht 65.0 in | Wt 190.2 lb

## 2017-03-08 DIAGNOSIS — R05 Cough: Secondary | ICD-10-CM | POA: Insufficient documentation

## 2017-03-08 DIAGNOSIS — R059 Cough, unspecified: Secondary | ICD-10-CM

## 2017-03-08 MED ORDER — PROMETHAZINE-DM 6.25-15 MG/5ML PO SYRP: 5.0000 mL | ORAL_SOLUTION | Freq: Four times a day (QID) | ORAL | 0 refills | Status: DC | PRN

## 2017-03-08 NOTE — Progress Notes (Signed)
Pre visit review using our clinic review tool, if applicable. No additional management support is needed unless otherwise documented below in the visit note. 
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
## 2017-03-08 NOTE — Patient Instructions (Signed)
Upper Respiratory Infection, Adult Most upper respiratory infections (URIs) are caused by a virus. A URI affects the nose, throat, and upper air passages. The most common type of URI is often called "the common cold." Follow these instructions at home:  Take medicines only as told by your doctor.  Gargle warm saltwater or take cough drops to comfort your throat as told by your doctor.  Use a warm mist humidifier or inhale steam from a shower to increase air moisture. This may make it easier to breathe.  Drink enough fluid to keep your pee (urine) clear or pale yellow.  Eat soups and other clear broths.  Have a healthy diet.  Rest as needed.  Go back to work when your fever is gone or your doctor says it is okay. ? You may need to stay home longer to avoid giving your URI to others. ? You can also wear a face mask and wash your hands often to prevent spread of the virus.  Use your inhaler more if you have asthma.  Do not use any tobacco products, including cigarettes, chewing tobacco, or electronic cigarettes. If you need help quitting, ask your doctor. Contact a doctor if:  You are getting worse, not better.  Your symptoms are not helped by medicine.  You have chills.  You are getting more short of breath.  You have brown or red mucus.  You have yellow or brown discharge from your nose.  You have pain in your face, especially when you bend forward.  You have a fever.  You have puffy (swollen) neck glands.  You have pain while swallowing.  You have white areas in the back of your throat. Get help right away if:  You have very bad or constant: ? Headache. ? Ear pain. ? Pain in your forehead, behind your eyes, and over your cheekbones (sinus pain). ? Chest pain.  You have long-lasting (chronic) lung disease and any of the following: ? Wheezing. ? Long-lasting cough. ? Coughing up blood. ? A change in your usual mucus.  You have a stiff neck.  You have  changes in your: ? Vision. ? Hearing. ? Thinking. ? Mood. This information is not intended to replace advice given to you by your health care provider. Make sure you discuss any questions you have with your health care provider. Document Released: 12/30/2007 Document Revised: 03/15/2016 Document Reviewed: 10/18/2013 Elsevier Interactive Patient Education  2018 Elsevier Inc.  

## 2017-06-30 ENCOUNTER — Telehealth: Payer: Self-pay

## 2017-06-30 DIAGNOSIS — H9209 Otalgia, unspecified ear: Secondary | ICD-10-CM

## 2017-06-30 NOTE — Telephone Encounter (Signed)
Please advise 

## 2017-06-30 NOTE — Telephone Encounter (Signed)
Copied from [place] [phone]. Topic: Referral - Request >> Jun 30, 2017  2:57 PM Wynetta Emery, [place] C wrote: Reason for CRM: pt would like to have a referral to ENT, per her last visit she is still having trouble with her ear and throat.   Please assist further.

## 2017-07-01 NOTE — Telephone Encounter (Signed)
Ok to [employer]

## 2017-07-01 NOTE — Telephone Encounter (Signed)
ENT referral placed.

## 2017-07-03 DIAGNOSIS — K115 Sialolithiasis: Secondary | ICD-10-CM | POA: Diagnosis not present

## 2017-07-03 DIAGNOSIS — H6691 Otitis media, unspecified, right ear: Secondary | ICD-10-CM | POA: Diagnosis not present

## 2017-07-09 DIAGNOSIS — J029 Acute pharyngitis, unspecified: Secondary | ICD-10-CM | POA: Diagnosis not present

## 2017-08-25 ENCOUNTER — Other Ambulatory Visit: Payer: Self-pay | Admitting: Family Medicine

## 2017-08-25 DIAGNOSIS — I10 Essential (primary) hypertension: Secondary | ICD-10-CM

## 2017-09-09 ENCOUNTER — Ambulatory Visit (INDEPENDENT_AMBULATORY_CARE_PROVIDER_SITE_OTHER): Payer: Medicare Other | Admitting: Family Medicine

## 2017-09-09 ENCOUNTER — Encounter: Payer: Self-pay | Admitting: Family Medicine

## 2017-09-09 VITALS — BP 110/72 | HR 68 | Temp 98.0°F | Resp 16 | Ht 65.0 in | Wt 190.2 lb

## 2017-09-09 DIAGNOSIS — E1169 Type 2 diabetes mellitus with other specified complication: Secondary | ICD-10-CM | POA: Diagnosis not present

## 2017-09-09 DIAGNOSIS — Z0000 Encounter for general adult medical examination without abnormal findings: Secondary | ICD-10-CM | POA: Insufficient documentation

## 2017-09-09 DIAGNOSIS — I10 Essential (primary) hypertension: Secondary | ICD-10-CM | POA: Diagnosis not present

## 2017-09-09 DIAGNOSIS — E785 Hyperlipidemia, unspecified: Secondary | ICD-10-CM

## 2017-09-09 MED ORDER — GLUCOSE BLOOD VI STRP: ORAL_STRIP | 2 refills | Status: DC

## 2017-09-09 MED ORDER — AMLODIPINE BESYLATE 5 MG PO TABS: 5.0000 mg | ORAL_TABLET | Freq: Every day | ORAL | 1 refills | Status: DC

## 2017-09-09 MED ORDER — GLUCOSE BLOOD VI STRP: ORAL_STRIP | 12 refills | Status: DC

## 2017-09-09 NOTE — Assessment & Plan Note (Signed)
ghm utd Check labs  See avs  

## 2017-09-09 NOTE — Patient Instructions (Signed)
Preventive Care 65 Years and Older, Female Preventive care refers to lifestyle choices and visits with your health care provider that can promote health and wellness. What does preventive care include?  A yearly physical exam. This is also called an annual well check.  Dental exams once or twice a year.  Routine eye exams. Ask your health care provider how often you should have your eyes checked.  Personal lifestyle choices, including: ? Daily care of your teeth and gums. ? Regular physical activity. ? Eating a healthy diet. ? Avoiding tobacco and drug use. ? Limiting alcohol use. ? Practicing safe sex. ? Taking low-dose aspirin every day. ? Taking vitamin and mineral supplements as recommended by your health care provider. What happens during an annual well check? The services and screenings done by your health care provider during your annual well check will depend on your age, overall health, lifestyle risk factors, and family history of disease. Counseling Your health care provider may ask you questions about your:  Alcohol use.  Tobacco use.  Drug use.  Emotional well-being.  Home and relationship well-being.  Sexual activity.  Eating habits.  History of falls.  Memory and ability to understand (cognition).  Work and work environment.  Reproductive health.  Screening You may have the following tests or measurements:  Height, weight, and BMI.  Blood pressure.  Lipid and cholesterol levels. These may be checked every 5 years, or more frequently if you are over 50 years old.  Skin check.  Lung cancer screening. You may have this screening every year starting at age 55 if you have a 30-pack-year history of smoking and currently smoke or have quit within the past 15 years.  Fecal occult blood test (FOBT) of the stool. You may have this test every year starting at age 50.  Flexible sigmoidoscopy or colonoscopy. You may have a sigmoidoscopy every 5 years or  a colonoscopy every 10 years starting at age 50.  Hepatitis C blood test.  Hepatitis B blood test.  Sexually transmitted disease (STD) testing.  Diabetes screening. This is done by checking your blood sugar (glucose) after you have not eaten for a while (fasting). You may have this done every 1-3 years.  Bone density scan. This is done to screen for osteoporosis. You may have this done starting at age 65.  Mammogram. This may be done every 1-2 years. Talk to your health care provider about how often you should have regular mammograms.  Talk with your health care provider about your test results, treatment options, and if necessary, the need for more tests. Vaccines Your health care provider may recommend certain vaccines, such as:  Influenza vaccine. This is recommended every year.  Tetanus, diphtheria, and acellular pertussis (Tdap, Td) vaccine. You may need a Td booster every 10 years.  Varicella vaccine. You may need this if you have not been vaccinated.  Zoster vaccine. You may need this after age 60.  Measles, mumps, and rubella (MMR) vaccine. You may need at least one dose of MMR if you were born in 1957 or later. You may also need a second dose.  Pneumococcal 13-valent conjugate (PCV13) vaccine. One dose is recommended after age 65.  Pneumococcal polysaccharide (PPSV23) vaccine. One dose is recommended after age 65.  Meningococcal vaccine. You may need this if you have certain conditions.  Hepatitis A vaccine. You may need this if you have certain conditions or if you travel or work in places where you may be exposed to hepatitis   A.  Hepatitis B vaccine. You may need this if you have certain conditions or if you travel or work in places where you may be exposed to hepatitis B.  Haemophilus influenzae type b (Hib) vaccine. You may need this if you have certain conditions.  Talk to your health care provider about which screenings and vaccines you need and how often you  need them. This information is not intended to replace advice given to you by your health care provider. Make sure you discuss any questions you have with your health care provider. Document Released: 08/09/2015 Document Revised: 04/01/2016 Document Reviewed: 05/14/2015 Elsevier Interactive Patient Education  2018 Elsevier Inc.  

## 2017-09-09 NOTE — Assessment & Plan Note (Signed)
Well controlled, no changes to meds. Encouraged heart healthy diet such as the DASH diet and exercise as tolerated.  °

## 2017-09-09 NOTE — Addendum Note (Signed)
Addended by: Kem Boroughs D on: 09/09/2017 02:58 PM   Modules accepted: Orders

## 2017-09-09 NOTE — Assessment & Plan Note (Signed)
Tolerating statin, encouraged heart healthy diet, avoid trans fats, minimize simple carbs and saturated fats. Increase exercise as tolerated 
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
## 2017-09-10 ENCOUNTER — Telehealth: Payer: Self-pay | Admitting: *Deleted

## 2017-09-10 NOTE — Telephone Encounter (Signed)
Copied from [place]. Topic: General - Other >> Sep 10, 2017 11:06 AM Synthia Innocent wrote: Reason for CRM: Patient seen yesterday for CPE w/ Dr Etter Sjogren. Restaurant manager, [employer] would not verify. Patient states we need to go to [email or web address] to verify or call [phone]

## 2017-09-13 ENCOUNTER — Other Ambulatory Visit (INDEPENDENT_AMBULATORY_CARE_PROVIDER_SITE_OTHER): Payer: Medicare Other

## 2017-09-13 DIAGNOSIS — E785 Hyperlipidemia, unspecified: Secondary | ICD-10-CM

## 2017-09-13 DIAGNOSIS — I10 Essential (primary) hypertension: Secondary | ICD-10-CM | POA: Diagnosis not present

## 2017-09-13 DIAGNOSIS — E1169 Type 2 diabetes mellitus with other specified complication: Secondary | ICD-10-CM

## 2017-09-13 LAB — CBC WITH DIFFERENTIAL/PLATELET
BASOS PCT: 0.6 % (ref 0.0–3.0)
Basophils Absolute: 0 10*3/uL (ref 0.0–0.1)
EOS PCT: 3 % (ref 0.0–5.0)
Eosinophils Absolute: 0.1 10*3/uL (ref 0.0–0.7)
HCT: 40.7 % (ref 36.0–46.0)
HEMOGLOBIN: 13.8 g/dL (ref 12.0–15.0)
Lymphocytes Relative: 47.5 % — ABNORMAL HIGH (ref 12.0–46.0)
Lymphs Abs: 2.1 10*3/uL (ref 0.7–4.0)
MCHC: 33.9 g/dL (ref 30.0–36.0)
MCV: 89.4 fl (ref 78.0–100.0)
MONO ABS: 0.4 10*3/uL (ref 0.1–1.0)
MONOS PCT: 8.9 % (ref 3.0–12.0)
Neutro Abs: 1.8 10*3/uL (ref 1.4–7.7)
Neutrophils Relative %: 40 % — ABNORMAL LOW (ref 43.0–77.0)
Platelets: 314 10*3/uL (ref 150.0–400.0)
RBC: 4.56 Mil/uL (ref 3.87–5.11)
RDW: 14.1 % (ref 11.5–15.5)
WBC: 4.5 10*3/uL (ref 4.0–10.5)

## 2017-09-13 LAB — COMPREHENSIVE METABOLIC PANEL
ALBUMIN: 4.2 g/dL (ref 3.5–5.2)
ALK PHOS: 74 U/L (ref 39–117)
ALT: 28 U/L (ref 0–35)
AST: 25 U/L (ref 0–37)
BUN: 13 mg/dL (ref 6–23)
CHLORIDE: 101 meq/L (ref 96–112)
CO2: 28 mEq/L (ref 19–32)
Calcium: 9.2 mg/dL (ref 8.4–10.5)
Creatinine, Ser: 0.76 mg/dL (ref 0.40–1.20)
GFR: 98.16 mL/min (ref >60.00)
Glucose, Bld: 133 mg/dL — ABNORMAL HIGH (ref 70–99)
POTASSIUM: 3.2 meq/L — AB (ref 3.5–5.1)
SODIUM: 138 meq/L (ref 135–145)
TOTAL PROTEIN: 7.3 g/dL (ref 6.0–8.3)
Total Bilirubin: 0.7 mg/dL (ref 0.2–1.2)

## 2017-09-13 LAB — LIPID PANEL
CHOLESTEROL: 265 mg/dL — AB (ref 0–200)
HDL: 81.2 mg/dL (ref >39.00)
LDL CALC: 170 mg/dL — AB (ref 0–99)
NonHDL: 183.89
TRIGLYCERIDES: 70 mg/dL (ref 0.0–149.0)
Total CHOL/HDL Ratio: 3
VLDL: 14 mg/dL (ref 0.0–40.0)

## 2017-09-13 LAB — MICROALBUMIN / CREATININE URINE RATIO
Creatinine,U: 221.4 mg/dL
Microalb Creat Ratio: 1.4 mg/g (ref 0.0–30.0)
Microalb, Ur: 3.1 mg/dL — ABNORMAL HIGH (ref 0.0–1.9)

## 2017-09-13 LAB — HEMOGLOBIN A1C: Hgb A1c MFr Bld: 6.8 % — ABNORMAL HIGH (ref 4.6–6.5)

## 2017-09-20 ENCOUNTER — Other Ambulatory Visit: Payer: Self-pay | Admitting: *Deleted

## 2017-09-20 ENCOUNTER — Encounter: Payer: Self-pay | Admitting: *Deleted

## 2017-09-20 DIAGNOSIS — E785 Hyperlipidemia, unspecified: Secondary | ICD-10-CM

## 2017-09-20 DIAGNOSIS — I10 Essential (primary) hypertension: Secondary | ICD-10-CM

## 2017-09-20 DIAGNOSIS — E119 Type 2 diabetes mellitus without complications: Secondary | ICD-10-CM

## 2017-09-20 MED ORDER — POTASSIUM CHLORIDE CRYS ER 20 MEQ PO TBCR: 20.0000 meq | EXTENDED_RELEASE_TABLET | Freq: Every day | ORAL | 2 refills | Status: DC

## 2017-09-21 ENCOUNTER — Telehealth: Payer: Self-pay

## 2017-09-21 MED ORDER — PITAVASTATIN CALCIUM 2 MG PO TABS: 2.0000 mg | ORAL_TABLET | Freq: Every day | ORAL | 2 refills | Status: DC

## 2017-09-21 NOTE — Telephone Encounter (Signed)
PA initiated via Covermymeds; KEY: [id number]. Received real-time PA approval.   [id number];XVEZBM:[id number];Review Type:Prior Auth;Coverage Start Date:08/22/2017;Coverage End Date:07/26/2098;

## 2017-09-21 NOTE — Addendum Note (Signed)
Addended by: Wynonia Musty A on: 09/21/2017 03:41 PM   Modules accepted: Orders

## 2017-11-24 DIAGNOSIS — E119 Type 2 diabetes mellitus without complications: Secondary | ICD-10-CM | POA: Diagnosis not present

## 2017-11-24 DIAGNOSIS — H18413 Arcus senilis, bilateral: Secondary | ICD-10-CM | POA: Diagnosis not present

## 2017-11-24 DIAGNOSIS — H2513 Age-related nuclear cataract, bilateral: Secondary | ICD-10-CM | POA: Diagnosis not present

## 2017-11-24 DIAGNOSIS — H524 Presbyopia: Secondary | ICD-10-CM | POA: Diagnosis not present

## 2017-11-24 DIAGNOSIS — H52203 Unspecified astigmatism, bilateral: Secondary | ICD-10-CM | POA: Diagnosis not present

## 2017-11-24 DIAGNOSIS — H43393 Other vitreous opacities, bilateral: Secondary | ICD-10-CM | POA: Diagnosis not present

## 2017-11-24 LAB — HM DIABETES EYE EXAM

## 2017-12-07 ENCOUNTER — Other Ambulatory Visit: Payer: Self-pay | Admitting: *Deleted

## 2017-12-07 DIAGNOSIS — Z0000 Encounter for general adult medical examination without abnormal findings: Secondary | ICD-10-CM

## 2017-12-07 DIAGNOSIS — R05 Cough: Secondary | ICD-10-CM

## 2017-12-07 DIAGNOSIS — R059 Cough, unspecified: Secondary | ICD-10-CM

## 2017-12-07 DIAGNOSIS — I10 Essential (primary) hypertension: Secondary | ICD-10-CM

## 2017-12-07 MED ORDER — AMLODIPINE BESYLATE 5 MG PO TABS: 5.0000 mg | ORAL_TABLET | Freq: Every day | ORAL | 1 refills | Status: DC

## 2017-12-22 ENCOUNTER — Other Ambulatory Visit (INDEPENDENT_AMBULATORY_CARE_PROVIDER_SITE_OTHER): Payer: Medicare Other

## 2017-12-22 DIAGNOSIS — E785 Hyperlipidemia, unspecified: Secondary | ICD-10-CM

## 2017-12-22 DIAGNOSIS — I10 Essential (primary) hypertension: Secondary | ICD-10-CM | POA: Diagnosis not present

## 2017-12-22 DIAGNOSIS — E119 Type 2 diabetes mellitus without complications: Secondary | ICD-10-CM | POA: Diagnosis not present

## 2017-12-22 LAB — COMPREHENSIVE METABOLIC PANEL
ALK PHOS: 64 U/L (ref 39–117)
ALT: 27 U/L (ref 0–35)
AST: 24 U/L (ref 0–37)
Albumin: 4.3 g/dL (ref 3.5–5.2)
BILIRUBIN TOTAL: 0.8 mg/dL (ref 0.2–1.2)
BUN: 12 mg/dL (ref 6–23)
CALCIUM: 9.6 mg/dL (ref 8.4–10.5)
CO2: 31 mEq/L (ref 19–32)
Chloride: 98 mEq/L (ref 96–112)
Creatinine, Ser: 0.79 mg/dL (ref 0.40–1.20)
GFR: 93.79 mL/min (ref >60.00)
Glucose, Bld: 127 mg/dL — ABNORMAL HIGH (ref 70–99)
Potassium: 3.4 mEq/L — ABNORMAL LOW (ref 3.5–5.1)
Sodium: 139 mEq/L (ref 135–145)
TOTAL PROTEIN: 7.2 g/dL (ref 6.0–8.3)

## 2017-12-22 LAB — MICROALBUMIN / CREATININE URINE RATIO
CREATININE, U: 221.6 mg/dL
MICROALB/CREAT RATIO: 0.8 mg/g (ref 0.0–30.0)
Microalb, Ur: 1.8 mg/dL (ref 0.0–1.9)

## 2017-12-22 LAB — LIPID PANEL
Cholesterol: 266 mg/dL — ABNORMAL HIGH (ref 0–200)
HDL: 88.8 mg/dL (ref >39.00)
LDL Cholesterol: 163 mg/dL — ABNORMAL HIGH (ref 0–99)
NonHDL: 176.86
Total CHOL/HDL Ratio: 3
Triglycerides: 70 mg/dL (ref 0.0–149.0)
VLDL: 14 mg/dL (ref 0.0–40.0)

## 2017-12-22 LAB — HEMOGLOBIN A1C: Hgb A1c MFr Bld: 6.7 % — ABNORMAL HIGH (ref 4.6–6.5)

## 2017-12-27 ENCOUNTER — Other Ambulatory Visit: Payer: Self-pay | Admitting: Family Medicine

## 2017-12-27 DIAGNOSIS — E782 Mixed hyperlipidemia: Secondary | ICD-10-CM

## 2017-12-27 DIAGNOSIS — E119 Type 2 diabetes mellitus without complications: Secondary | ICD-10-CM

## 2018-01-24 DIAGNOSIS — K219 Gastro-esophageal reflux disease without esophagitis: Secondary | ICD-10-CM | POA: Diagnosis not present

## 2018-02-24 ENCOUNTER — Other Ambulatory Visit: Payer: Self-pay | Admitting: Family Medicine

## 2018-02-24 DIAGNOSIS — I10 Essential (primary) hypertension: Secondary | ICD-10-CM

## 2018-02-24 MED ORDER — HYDROCHLOROTHIAZIDE 25 MG PO TABS: 12.5000 mg | ORAL_TABLET | Freq: Every day | ORAL | 3 refills | Status: DC

## 2018-03-11 ENCOUNTER — Other Ambulatory Visit: Payer: Self-pay | Admitting: *Deleted

## 2018-03-11 DIAGNOSIS — I10 Essential (primary) hypertension: Secondary | ICD-10-CM

## 2018-03-11 MED ORDER — HYDROCHLOROTHIAZIDE 25 MG PO TABS: 12.5000 mg | ORAL_TABLET | Freq: Every day | ORAL | 1 refills | Status: DC
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
## 2018-03-15 ENCOUNTER — Ambulatory Visit: Payer: Medicare Other | Admitting: *Deleted

## 2018-03-15 ENCOUNTER — Encounter: Payer: Self-pay | Admitting: Family Medicine

## 2018-03-15 ENCOUNTER — Ambulatory Visit (INDEPENDENT_AMBULATORY_CARE_PROVIDER_SITE_OTHER): Payer: Medicare Other | Admitting: Family Medicine

## 2018-03-15 VITALS — BP 129/71 | HR 60 | Temp 97.9°F | Resp 16 | Ht 65.0 in | Wt 189.8 lb

## 2018-03-15 DIAGNOSIS — E782 Mixed hyperlipidemia: Secondary | ICD-10-CM

## 2018-03-15 DIAGNOSIS — I10 Essential (primary) hypertension: Secondary | ICD-10-CM | POA: Diagnosis not present

## 2018-03-15 DIAGNOSIS — E119 Type 2 diabetes mellitus without complications: Secondary | ICD-10-CM

## 2018-03-15 DIAGNOSIS — E785 Hyperlipidemia, unspecified: Secondary | ICD-10-CM | POA: Diagnosis not present

## 2018-03-15 LAB — LIPID PANEL
CHOLESTEROL: 299 mg/dL — AB (ref 0–200)
HDL: 90.1 mg/dL (ref >39.00)
LDL Cholesterol: 194 mg/dL — ABNORMAL HIGH (ref 0–99)
NonHDL: 209.33
Total CHOL/HDL Ratio: 3
Triglycerides: 76 mg/dL (ref 0.0–149.0)
VLDL: 15.2 mg/dL (ref 0.0–40.0)

## 2018-03-15 LAB — COMPREHENSIVE METABOLIC PANEL
ALBUMIN: 4.4 g/dL (ref 3.5–5.2)
ALT: 25 U/L (ref 0–35)
AST: 22 U/L (ref 0–37)
Alkaline Phosphatase: 66 U/L (ref 39–117)
BUN: 12 mg/dL (ref 6–23)
CO2: 32 mEq/L (ref 19–32)
Calcium: 9.7 mg/dL (ref 8.4–10.5)
Chloride: 99 mEq/L (ref 96–112)
Creatinine, Ser: 0.86 mg/dL (ref 0.40–1.20)
GFR: 84.98 mL/min (ref >60.00)
Glucose, Bld: 127 mg/dL — ABNORMAL HIGH (ref 70–99)
POTASSIUM: 3.4 meq/L — AB (ref 3.5–5.1)
SODIUM: 140 meq/L (ref 135–145)
Total Bilirubin: 1.1 mg/dL (ref 0.2–1.2)
Total Protein: 7.3 g/dL (ref 6.0–8.3)

## 2018-03-15 LAB — HEMOGLOBIN A1C: HEMOGLOBIN A1C: 6.9 % — AB (ref 4.6–6.5)

## 2018-03-15 MED ORDER — GLUCOSE BLOOD VI STRP: ORAL_STRIP | 12 refills | Status: DC

## 2018-03-15 NOTE — Assessment & Plan Note (Signed)
Well controlled, no changes to meds. Encouraged heart healthy diet such as the DASH diet and exercise as tolerated.  °

## 2018-03-15 NOTE — Patient Instructions (Signed)

## 2018-03-15 NOTE — Assessment & Plan Note (Signed)
Encouraged heart healthy diet, increase exercise, avoid trans fats, consider a krill oil cap daily 
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
## 2018-03-18 ENCOUNTER — Telehealth: Payer: Self-pay | Admitting: *Deleted

## 2018-03-18 NOTE — Telephone Encounter (Signed)
Received Medical records from [facility]; forwarded to provider/SLS 08/23

## 2018-03-25 DIAGNOSIS — E7849 Other hyperlipidemia: Secondary | ICD-10-CM

## 2018-03-25 MED ORDER — PITAVASTATIN CALCIUM 4 MG PO TABS: 4.0000 mg | ORAL_TABLET | Freq: Every day | ORAL | 2 refills | Status: DC

## 2018-03-25 NOTE — Addendum Note (Signed)
Addended by: Wynonia Musty A on: 03/25/2018 02:18 PM   Modules accepted: Orders

## 2018-03-29 ENCOUNTER — Other Ambulatory Visit: Payer: Medicare Other

## 2018-04-01 ENCOUNTER — Telehealth: Payer: Self-pay | Admitting: *Deleted

## 2018-04-01 MED ORDER — FREESTYLE FREEDOM LITE W/DEVICE KIT: PACK | 0 refills | Status: AC

## 2018-04-01 NOTE — Telephone Encounter (Signed)
rx sent in for freedom lite per patient request to get free meter

## 2018-04-01 NOTE — Telephone Encounter (Signed)
Copied from [place] [id number]. Topic: Quick Communication - Rx Refill/Question >> Mar 31, 2018 12:35 PM Wynetta Emery, [place] C wrote: Medication: Free style meter   Has the patient contacted their pharmacy? No  (Agent: If no, request that the patient contact the pharmacy for the refill.) (Agent: If yes, when and what did the pharmacy advise?)  Preferred Pharmacy (with phone number or street name): [facility], [address] [phone] (Phone) [phone] (Fax)    Agent: Please be advised that RX refills may take up to 3 business days. We ask that you follow-up with your pharmacy.

## 2018-04-05 ENCOUNTER — Encounter: Payer: Self-pay | Admitting: Family Medicine

## 2018-04-05 ENCOUNTER — Ambulatory Visit (INDEPENDENT_AMBULATORY_CARE_PROVIDER_SITE_OTHER): Payer: Medicare Other | Admitting: Family Medicine

## 2018-04-05 ENCOUNTER — Telehealth: Payer: Self-pay | Admitting: *Deleted

## 2018-04-05 VITALS — BP 133/64 | HR 77 | Temp 99.5°F | Resp 16 | Ht 65.0 in | Wt 188.2 lb

## 2018-04-05 DIAGNOSIS — R112 Nausea with vomiting, unspecified: Secondary | ICD-10-CM | POA: Diagnosis not present

## 2018-04-05 DIAGNOSIS — R197 Diarrhea, unspecified: Secondary | ICD-10-CM | POA: Diagnosis not present

## 2018-04-05 LAB — POC URINALSYSI DIPSTICK (AUTOMATED)
Bilirubin, UA: NEGATIVE
Glucose, UA: NEGATIVE
Leukocytes, UA: NEGATIVE
NITRITE UA: NEGATIVE
PH UA: 6 (ref 5.0–8.0)
Protein, UA: POSITIVE — AB
SPEC GRAV UA: 1.02 (ref 1.010–1.025)
UROBILINOGEN UA: 0.2 U/dL

## 2018-04-05 NOTE — Patient Instructions (Signed)
Food Choices to Help Relieve Diarrhea, Adult  When you have diarrhea, the foods you eat and your eating habits are very important. Choosing the right foods and drinks can help:  · Relieve diarrhea.  · Replace lost fluids and nutrients.  · Prevent dehydration.    What general guidelines should I follow?  Relieving diarrhea  · Choose foods with less than 2 g or .07 oz. of fiber per serving.  · Limit fats to less than 8 tsp (38 g or 1.34 oz.) a day.  · Avoid the following:  ? Foods and beverages sweetened with high-fructose corn syrup, honey, or sugar alcohols such as xylitol, sorbitol, and mannitol.  ? Foods that contain a lot of fat or sugar.  ? Fried, greasy, or spicy foods.  ? High-fiber grains, breads, and cereals.  ? Raw fruits and vegetables.  · Eat foods that are rich in probiotics. These foods include dairy products such as yogurt and fermented milk products. They help increase healthy bacteria in the stomach and intestines (gastrointestinal tract, or GI tract).  · If you have lactose intolerance, avoid dairy products. These may make your diarrhea worse.  · Take medicine to help stop diarrhea (antidiarrheal medicine) only as told by your health care provider.  Replacing nutrients  · Eat small meals or snacks every 3–4 hours.  · Eat bland foods, such as white rice, toast, or baked potato, until your diarrhea starts to get better. Gradually reintroduce nutrient-rich foods as tolerated or as told by your health care provider. This includes:  ? Well-cooked protein foods.  ? Peeled, seeded, and soft-cooked fruits and vegetables.  ? Low-fat dairy products.  · Take vitamin and mineral supplements as told by your health care provider.  Preventing dehydration    · Start by sipping water or a special solution to prevent dehydration (oral rehydration solution, ORS). Urine that is clear or pale yellow means that you are getting enough fluid.  · Try to drink at least 8–10 cups of fluid each day to help replace lost  fluids.  · You may add other liquids in addition to water, such as clear juice or decaffeinated sports drinks, as tolerated or as told by your health care provider.  · Avoid drinks with caffeine, such as coffee, tea, or soft drinks.  · Avoid alcohol.  What foods are recommended?  The items listed may not be a complete list. Talk with your health care provider about what dietary choices are best for you.  Grains  White rice. White, French, or pita breads (fresh or toasted), including plain rolls, buns, or bagels. White pasta. Saltine, soda, or graham crackers. Pretzels. Low-fiber cereal. Cooked cereals made with water (such as cornmeal, farina, or cream cereals). Plain muffins. Matzo. Melba toast. Zwieback.  Vegetables  Potatoes (without the skin). Most well-cooked and canned vegetables without skins or seeds. Tender lettuce.  Fruits  Apple sauce. Fruits canned in juice. Cooked apricots, cherries, grapefruit, peaches, pears, or plums. Fresh bananas and cantaloupe.  Meats and other protein foods  Baked or boiled chicken. Eggs. Tofu. Fish. Seafood. Smooth nut butters. Ground or well-cooked tender beef, ham, veal, lamb, pork, or poultry.  Dairy  Plain yogurt, kefir, and unsweetened liquid yogurt. Lactose-free milk, buttermilk, skim milk, or soy milk. Low-fat or nonfat hard cheese.  Beverages  Water. Low-calorie sports drinks. Fruit juices without pulp. Strained tomato and vegetable juices. Decaffeinated teas. Sugar-free beverages not sweetened with sugar alcohols. Oral rehydration solutions, if approved by your health care   provider.  Seasoning and other foods  Bouillon, broth, or soups made from recommended foods.  What foods are not recommended?  The items listed may not be a complete list. Talk with your health care provider about what dietary choices are best for you.  Grains  Whole grain, whole wheat, bran, or rye breads, rolls, pastas, and crackers. Wild or brown rice. Whole grain or bran cereals. Barley. Oats  and oatmeal. Corn tortillas or taco shells. Granola. Popcorn.  Vegetables  Raw vegetables. Fried vegetables. Cabbage, broccoli, Brussels sprouts, artichokes, baked beans, beet greens, corn, kale, legumes, peas, sweet potatoes, and yams. Potato skins. Cooked spinach and cabbage.  Fruits  Dried fruit, including raisins and dates. Raw fruits. Stewed or dried prunes. Canned fruits with syrup.  Meat and other protein foods  Fried or fatty meats. Deli meats. Chunky nut butters. Nuts and seeds. Beans and lentils. Bacon. Hot dogs. Sausage.  Dairy  High-fat cheeses. Whole milk, chocolate milk, and beverages made with milk, such as milk shakes. Half-and-half. Cream. sour cream. Ice cream.  Beverages  Caffeinated beverages (such as coffee, tea, soda, or energy drinks). Alcoholic beverages. Fruit juices with pulp. Prune juice. Soft drinks sweetened with high-fructose corn syrup or sugar alcohols. High-calorie sports drinks.  Fats and oils  Butter. Cream sauces. Margarine. Salad oils. Plain salad dressings. Olives. Avocados. Mayonnaise.  Sweets and desserts  Sweet rolls, doughnuts, and sweet breads. Sugar-free desserts sweetened with sugar alcohols such as xylitol and sorbitol.  Seasoning and other foods  Honey. Hot sauce. Chili powder. Gravy. Cream-based or milk-based soups. Pancakes and waffles.  Summary  · When you have diarrhea, the foods you eat and your eating habits are very important.  · Make sure you get at least 8–10 cups of fluid each day, or enough to keep your urine clear or pale yellow.  · Eat bland foods and gradually reintroduce healthy, nutrient-rich foods as tolerated, or as told by your health care provider.  · Avoid high-fiber, fried, greasy, or spicy foods.  This information is not intended to replace advice given to you by your health care provider. Make sure you discuss any questions you have with your health care provider.  Document Released: 10/03/2003 Document Revised: 07/10/2016 Document  Reviewed: 07/10/2016  Elsevier Interactive Patient Education © 2018 [employer]

## 2018-04-05 NOTE — Telephone Encounter (Signed)
Patient seen today

## 2018-04-05 NOTE — Telephone Encounter (Signed)
Copied from [place] [id number]. Topic: Appointment Scheduling - Scheduling Inquiry for Clinic >> Apr 05, 2018  9:23 AM Sheran Luz wrote: Reason for CRM: Pt is sched for tomorrow with Dr Larose Kells but would like to know if there is any way that Dr Carollee Herter would see her today as a work in, stating that she would prefer to see her and thinks that she needs to get in sooner than tomorrow. Please advise.
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
## 2018-04-06 ENCOUNTER — Ambulatory Visit: Payer: Self-pay | Admitting: Internal Medicine

## 2018-04-06 LAB — CBC WITH DIFFERENTIAL/PLATELET
BASOS PCT: 1.5 % (ref 0.0–3.0)
Basophils Absolute: 0.1 10*3/uL (ref 0.0–0.1)
EOS ABS: 0 10*3/uL (ref 0.0–0.7)
Eosinophils Relative: 0.1 % (ref 0.0–5.0)
HEMATOCRIT: 42.7 % (ref 36.0–46.0)
HEMOGLOBIN: 14.5 g/dL (ref 12.0–15.0)
LYMPHS PCT: 12.9 % (ref 12.0–46.0)
Lymphs Abs: 0.9 10*3/uL (ref 0.7–4.0)
MCHC: 33.9 g/dL (ref 30.0–36.0)
MCV: 88.9 fl (ref 78.0–100.0)
MONO ABS: 0.5 10*3/uL (ref 0.1–1.0)
Monocytes Relative: 6.8 % (ref 3.0–12.0)
Neutro Abs: 5.3 10*3/uL (ref 1.4–7.7)
Neutrophils Relative %: 78.7 % — ABNORMAL HIGH (ref 43.0–77.0)
Platelets: 280 10*3/uL (ref 150.0–400.0)
RBC: 4.81 Mil/uL (ref 3.87–5.11)
RDW: 14.4 % (ref 11.5–15.5)
WBC: 6.7 10*3/uL (ref 4.0–10.5)

## 2018-04-06 LAB — COMPREHENSIVE METABOLIC PANEL
ALBUMIN: 4.4 g/dL (ref 3.5–5.2)
ALK PHOS: 64 U/L (ref 39–117)
ALT: 29 U/L (ref 0–35)
AST: 25 U/L (ref 0–37)
BILIRUBIN TOTAL: 1.1 mg/dL (ref 0.2–1.2)
BUN: 13 mg/dL (ref 6–23)
CALCIUM: 10.3 mg/dL (ref 8.4–10.5)
CO2: 31 mEq/L (ref 19–32)
CREATININE: 0.9 mg/dL (ref 0.40–1.20)
Chloride: 93 mEq/L — ABNORMAL LOW (ref 96–112)
GFR: 80.62 mL/min (ref >60.00)
Glucose, Bld: 125 mg/dL — ABNORMAL HIGH (ref 70–99)
Potassium: 3.3 mEq/L — ABNORMAL LOW (ref 3.5–5.1)
SODIUM: 134 meq/L — AB (ref 135–145)
Total Protein: 7.5 g/dL (ref 6.0–8.3)

## 2018-05-03 ENCOUNTER — Other Ambulatory Visit: Payer: Self-pay | Admitting: Family Medicine

## 2018-05-03 DIAGNOSIS — I10 Essential (primary) hypertension: Secondary | ICD-10-CM

## 2018-05-03 DIAGNOSIS — Z0000 Encounter for general adult medical examination without abnormal findings: Secondary | ICD-10-CM

## 2018-08-01 ENCOUNTER — Other Ambulatory Visit: Payer: Self-pay | Admitting: Family Medicine

## 2018-08-01 DIAGNOSIS — Z1231 Encounter for screening mammogram for malignant neoplasm of breast: Secondary | ICD-10-CM

## 2018-08-03 ENCOUNTER — Ambulatory Visit
Admission: RE | Admit: 2018-08-03 | Discharge: 2018-08-03 | Disposition: A | Discharge status: Home / Self Care | Payer: Medicare Other | Source: Ambulatory Visit | Attending: Family Medicine | Admitting: Family Medicine

## 2018-08-03 DIAGNOSIS — Z1231 Encounter for screening mammogram for malignant neoplasm of breast: Secondary | ICD-10-CM

## 2018-08-09 ENCOUNTER — Emergency Department (HOSPITAL_BASED_OUTPATIENT_CLINIC_OR_DEPARTMENT_OTHER)
Admission: EM | Admit: 2018-08-09 | Discharge: 2018-08-09 | Disposition: A | Discharge status: Home / Self Care | Payer: Medicare Other | Attending: Emergency Medicine | Admitting: Emergency Medicine

## 2018-08-09 ENCOUNTER — Other Ambulatory Visit: Payer: Self-pay

## 2018-08-09 ENCOUNTER — Encounter (HOSPITAL_BASED_OUTPATIENT_CLINIC_OR_DEPARTMENT_OTHER): Payer: Self-pay | Admitting: Emergency Medicine

## 2018-08-09 ENCOUNTER — Emergency Department (HOSPITAL_BASED_OUTPATIENT_CLINIC_OR_DEPARTMENT_OTHER): Payer: Medicare Other

## 2018-08-09 DIAGNOSIS — R1013 Epigastric pain: Secondary | ICD-10-CM | POA: Insufficient documentation

## 2018-08-09 DIAGNOSIS — R1010 Upper abdominal pain, unspecified: Secondary | ICD-10-CM

## 2018-08-09 DIAGNOSIS — E119 Type 2 diabetes mellitus without complications: Secondary | ICD-10-CM | POA: Diagnosis not present

## 2018-08-09 DIAGNOSIS — I10 Essential (primary) hypertension: Secondary | ICD-10-CM | POA: Diagnosis not present

## 2018-08-09 DIAGNOSIS — Z7982 Long term (current) use of aspirin: Secondary | ICD-10-CM | POA: Diagnosis not present

## 2018-08-09 DIAGNOSIS — R112 Nausea with vomiting, unspecified: Secondary | ICD-10-CM | POA: Diagnosis not present

## 2018-08-09 DIAGNOSIS — R1011 Right upper quadrant pain: Secondary | ICD-10-CM | POA: Diagnosis not present

## 2018-08-09 DIAGNOSIS — Z79899 Other long term (current) drug therapy: Secondary | ICD-10-CM | POA: Insufficient documentation

## 2018-08-09 DIAGNOSIS — K824 Cholesterolosis of gallbladder: Secondary | ICD-10-CM | POA: Diagnosis not present

## 2018-08-09 LAB — URINALYSIS, ROUTINE W REFLEX MICROSCOPIC
Bilirubin Urine: NEGATIVE
GLUCOSE, UA: NEGATIVE mg/dL
Hgb urine dipstick: NEGATIVE
Ketones, ur: NEGATIVE mg/dL
LEUKOCYTES UA: NEGATIVE
Nitrite: NEGATIVE
Protein, ur: NEGATIVE mg/dL
Specific Gravity, Urine: 1.01 (ref 1.005–1.030)
pH: 8 (ref 5.0–8.0)

## 2018-08-09 LAB — COMPREHENSIVE METABOLIC PANEL
ALT: 32 U/L (ref 0–44)
AST: 29 U/L (ref 15–41)
Albumin: 4.5 g/dL (ref 3.5–5.0)
Alkaline Phosphatase: 57 U/L (ref 38–126)
Anion gap: 10 (ref 5–15)
BUN: 15 mg/dL (ref 8–23)
CO2: 26 mmol/L (ref 22–32)
Calcium: 9.4 mg/dL (ref 8.9–10.3)
Chloride: 98 mmol/L (ref 98–111)
Creatinine, Ser: 0.75 mg/dL (ref 0.44–1.00)
GFR calc Af Amer: >60 mL/min (ref >60)
Glucose, Bld: 191 mg/dL — ABNORMAL HIGH (ref 70–99)
Potassium: 3.3 mmol/L — ABNORMAL LOW (ref 3.5–5.1)
Sodium: 134 mmol/L — ABNORMAL LOW (ref 135–145)
Total Bilirubin: 0.9 mg/dL (ref 0.3–1.2)
Total Protein: 7.8 g/dL (ref 6.5–8.1)

## 2018-08-09 LAB — CBC WITH DIFFERENTIAL/PLATELET
Abs Immature Granulocytes: 0.02 10*3/uL (ref 0.00–0.07)
Basophils Absolute: 0 10*3/uL (ref 0.0–0.1)
Basophils Relative: 0 %
Eosinophils Absolute: 0 10*3/uL (ref 0.0–0.5)
Eosinophils Relative: 0 %
HEMATOCRIT: 43.1 % (ref 36.0–46.0)
Hemoglobin: 14.1 g/dL (ref 12.0–15.0)
Immature Granulocytes: 0 %
LYMPHS ABS: 0.9 10*3/uL (ref 0.7–4.0)
Lymphocytes Relative: 14 %
MCH: 29.1 pg (ref 26.0–34.0)
MCHC: 32.7 g/dL (ref 30.0–36.0)
MCV: 89 fL (ref 80.0–100.0)
Monocytes Absolute: 0.2 10*3/uL (ref 0.1–1.0)
Monocytes Relative: 3 %
Neutro Abs: 5.4 10*3/uL (ref 1.7–7.7)
Neutrophils Relative %: 83 %
Platelets: 306 10*3/uL (ref 150–400)
RBC: 4.84 MIL/uL (ref 3.87–5.11)
RDW: 13.6 % (ref 11.5–15.5)
WBC: 6.6 10*3/uL (ref 4.0–10.5)
nRBC: 0 % (ref 0.0–0.2)

## 2018-08-09 LAB — LIPASE, BLOOD: Lipase: 34 U/L (ref 11–51)

## 2018-08-09 MED ORDER — SODIUM CHLORIDE 0.9 % IV BOLUS
1000.0000 mL | Freq: Once | INTRAVENOUS | Status: AC
  Administered 2018-08-09: 1000 mL via INTRAVENOUS

## 2018-08-09 MED ORDER — ONDANSETRON HCL 4 MG PO TABS: 4.0000 mg | ORAL_TABLET | Freq: Three times a day (TID) | ORAL | 0 refills | Status: DC | PRN

## 2018-08-09 MED ORDER — ONDANSETRON HCL 4 MG/2ML IJ SOLN
4.0000 mg | Freq: Once | INTRAMUSCULAR | Status: AC
  Administered 2018-08-09: 4 mg via INTRAVENOUS
  Filled 2018-08-09: qty 2

## 2018-08-09 NOTE — ED Notes (Signed)
ED Provider at bedside. 

## 2018-08-09 NOTE — ED Notes (Signed)
Pt verbalized understanding of dc instructions.

## 2018-08-09 NOTE — ED Triage Notes (Signed)
Pt reports generalized abd pain that feels like gas pain since 2am with nausea. She made her vomit and states she felt some better.

## 2018-08-09 NOTE — Discharge Instructions (Signed)
Your work-up today was overall reassuring.  After he felt better with fluids and nausea medicine, we feel you are safe for discharge home given the improvement in symptoms.  Your laboratory testing was reassuring and was similar to prior.  No evidence of urinary tract infection or acute gallbladder disease.  We did see evidence of the hepatic steatosis (fatty liver), please follow-up with your primary doctor for this.  Please use the nausea medicine to help maintain hydration and follow-up with your primary doctor in the next several days.  If any symptoms change or worsen, please return the nearest emergency department.

## 2018-08-09 NOTE — ED Notes (Signed)
Patient transported to Ultrasound 
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
## 2018-08-10 LAB — URINE CULTURE: Culture: <10000 — AB

## 2018-09-27 ENCOUNTER — Encounter: Payer: Self-pay | Admitting: Family Medicine

## 2018-09-27 ENCOUNTER — Ambulatory Visit (INDEPENDENT_AMBULATORY_CARE_PROVIDER_SITE_OTHER): Payer: Medicare Other | Admitting: Family Medicine

## 2018-09-27 VITALS — BP 124/82 | HR 64 | Temp 97.6°F | Ht 65.0 in | Wt 191.0 lb

## 2018-09-27 DIAGNOSIS — J3489 Other specified disorders of nose and nasal sinuses: Secondary | ICD-10-CM | POA: Diagnosis not present

## 2018-09-27 MED ORDER — MUPIROCIN 2 % EX OINT: 1.0000 "application " | TOPICAL_OINTMENT | Freq: Two times a day (BID) | CUTANEOUS | 0 refills | Status: DC

## 2018-09-27 NOTE — Patient Instructions (Signed)
Mupirocin skin cream or ointment What is this medicine? MUPIROCIN (myoo PEER oh sin) is an antibiotic. It is used on the skin to treat skin infections. This medicine may be used for other purposes; ask your health care provider or pharmacist if you have questions. COMMON BRAND NAME(S): Bactroban, Centany, Centany AT What should I tell my health care provider before I take this medicine? They need to know if you have any of these conditions: -an unusual or allergic reaction to mupirocin, polyethylene glycol (PEG), or other topical antibiotic medicine -pregnant or trying to get pregnant -breast-feeding How should I use this medicine? This medicine is for external use only. Follow the directions on the prescription label. Wash your hands before and after use. Before applying, wash the affected area with mild soap and water and pat dry. Apply a small amount to the affected area and rub gently. You can cover the area with a gauze dressing. Do not get this medicine in your eyes. If you do, rinse out with plenty of cool tap water. Do not use your medicine more often than directed. Finish the full course of medicine prescribed by your doctor or health care professional even if you think your condition is better. Do not use over large areas of burnt skin. Talk to your pediatrician regarding the use of this medicine in children. Special care may be needed. Overdosage: If you think you have taken too much of this medicine contact a poison control center or emergency room at once. NOTE: This medicine is only for you. Do not share this medicine with others. What if I miss a dose? If you miss a dose, take it as soon as you can. If it is almost time for your next dose, take only that dose. Do not take double or extra doses. What may interact with this medicine? Interactions are not expected. Do not use any other skin products on the affected area without telling your doctor or health care professional. This list  may not describe all possible interactions. Give your health care provider a list of all the medicines, herbs, non-prescription drugs, or dietary supplements you use. Also tell them if you smoke, drink alcohol, or use illegal drugs. Some items may interact with your medicine. What should I watch for while using this medicine? Tell your doctor or health care professional if your skin condition does not begin to improve within 3 to 5 days. What side effects may I notice from receiving this medicine? Side effects that you should report to your doctor or health care professional as soon as possible: -skin rash, redness, continued swelling, burning, itching, stinging, or pain Side effects that usually do not require medical attention (report to your doctor or health care professional if they continue or are bothersome): -dry skin, itching This list may not describe all possible side effects. Call your doctor for medical advice about side effects. You may report side effects to FDA at 1-800-FDA-1088. Where should I keep my medicine? Keep out of the reach of children. Store at room temperature between 20 and 25 degrees C (68 and 77 degrees F). Throw away any unused medicine after the expiration date. NOTE: This sheet is a summary. It may not cover all possible information. If you have questions about this medicine, talk to your doctor, pharmacist, or health care provider.  2019 Elsevier/Gold Standard (2008-01-30 14:38:18)
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
## 2018-10-26 ENCOUNTER — Other Ambulatory Visit: Payer: Self-pay | Admitting: Family Medicine

## 2018-10-26 DIAGNOSIS — I10 Essential (primary) hypertension: Secondary | ICD-10-CM

## 2019-02-22 ENCOUNTER — Other Ambulatory Visit: Payer: Self-pay

## 2019-05-18 ENCOUNTER — Other Ambulatory Visit: Payer: Self-pay | Admitting: Family Medicine

## 2019-05-18 DIAGNOSIS — I10 Essential (primary) hypertension: Secondary | ICD-10-CM

## 2019-05-18 DIAGNOSIS — Z0000 Encounter for general adult medical examination without abnormal findings: Secondary | ICD-10-CM

## 2019-05-24 ENCOUNTER — Ambulatory Visit (INDEPENDENT_AMBULATORY_CARE_PROVIDER_SITE_OTHER): Payer: Medicare Other | Admitting: Family Medicine

## 2019-05-24 ENCOUNTER — Encounter: Payer: Self-pay | Admitting: Family Medicine

## 2019-05-24 ENCOUNTER — Other Ambulatory Visit: Payer: Self-pay

## 2019-05-24 VITALS — BP 128/76 | HR 55 | Ht 65.0 in | Wt 181.8 lb

## 2019-05-24 DIAGNOSIS — E785 Hyperlipidemia, unspecified: Secondary | ICD-10-CM | POA: Diagnosis not present

## 2019-05-24 DIAGNOSIS — E1169 Type 2 diabetes mellitus with other specified complication: Secondary | ICD-10-CM

## 2019-05-24 DIAGNOSIS — E1165 Type 2 diabetes mellitus with hyperglycemia: Secondary | ICD-10-CM | POA: Diagnosis not present

## 2019-05-24 DIAGNOSIS — Z0000 Encounter for general adult medical examination without abnormal findings: Secondary | ICD-10-CM

## 2019-05-24 DIAGNOSIS — I10 Essential (primary) hypertension: Secondary | ICD-10-CM

## 2019-05-24 MED ORDER — HYDROCHLOROTHIAZIDE 25 MG PO TABS: 12.5000 mg | ORAL_TABLET | Freq: Every day | ORAL | 3 refills | Status: DC

## 2019-05-24 MED ORDER — AMLODIPINE BESYLATE 5 MG PO TABS: 5.0000 mg | ORAL_TABLET | Freq: Every day | ORAL | 3 refills | Status: DC

## 2019-05-24 NOTE — Assessment & Plan Note (Signed)
hgba1c to be checked, minimize simple carbs. Increase exercise as tolerated. Continue current meds  

## 2019-05-24 NOTE — Assessment & Plan Note (Signed)
Encouraged heart healthy diet, increase exercise, avoid trans fats, consider a krill oil cap daily 

## 2019-05-24 NOTE — Assessment & Plan Note (Signed)
Well controlled, no changes to meds. Encouraged heart healthy diet such as the DASH diet and exercise as tolerated.  °

## 2019-05-24 NOTE — Progress Notes (Signed)
Virtual Visit via Video Note  I connected with Adriana Collins on 05/24/19 at 11:00 AM EDT by a video enabled telemedicine application and verified that I am speaking with the correct person using two identifiers.  Location: Patient: home  Provider: home    I discussed the limitations of evaluation and management by telemedicine and the availability of in person appointments. The patient expressed understanding and agreed to proceed.  History of Present Illness: Pt is home -- f/u dm, htn, chol  HYPERTENSION   Blood pressure range-not checking   Chest pain- no      Dyspnea- no Lightheadedness- no   Edema- no  Other side effects - no   Medication compliance: good Low salt diet- yes    DIABETES    Blood Sugar ranges-109 today--- sometimes lower   Polyuria- no New Visual problems- no  Hypoglycemic symptoms- no  Other side effects-no Medication compliance - good Last eye exam- last year -- due this year    HYPERLIPIDEMIA  Medication compliance- good RUQ pain- no  Muscle aches- no Other side effects-no       Observations/Objective: Vitals:   05/24/19 1059  BP: 128/76  Pulse: (!) 55  pt is in NAD  No sob   Assessment and Plan: 1. Essential hypertension Well controlled, no changes to meds. Encouraged heart healthy diet such as the DASH diet and exercise as tolerated.  - amLODipine (NORVASC) 5 MG tablet; Take 1 .(5 mg total) by mouth daily.  Dispense: 90 tablet; Refill: 3 - hydrochlorothiazide (HYDRODIURIL) 25 MG tablet; Take 0.5 tablets (12.5 mg total) by mouth daily.  Dispense: 45 tablet; Refill: 3 - Lipid panel; Future - Hemoglobin A1c; Future - Comprehensive metabolic panel; Future - Microalbumin / creatinine urine ratio; Future  2. Preventative health care  - amLODipine (NORVASC) 5 MG tablet; Take 1 tablet (5 mg total) by mouth daily.  Dispense: 90 tablet; Refill: 3  3. Uncontrolled type 2 diabetes mellitus with hyperglycemia ([place]) hgba1c to be  checked, minimize simple carbs. Increase exercise as tolerated. Continue current meds - Lipid panel; Future - Hemoglobin A1c; Future - Comprehensive metabolic panel; Future - Microalbumin / creatinine urine ratio; Future  4. Hyperlipidemia associated with type 2 diabetes mellitus ([place]) Encouraged heart healthy diet, increase exercise, avoid trans fats, consider a krill oil cap daily - Lipid panel; Future - Hemoglobin A1c; Future - Comprehensive metabolic panel; Future - Microalbumin / creatinine urine ratio; Future   Follow Up Instructions:    I discussed the assessment and treatment plan with the patient. The patient was provided an opportunity to ask questions and all were answered. The patient agreed with the plan and demonstrated an understanding of the instructions.   The patient was advised to call back or seek an in-person evaluation if the symptoms worsen or if the condition fails to improve as anticipated.  I provided 25 minutes of non-face-to-face time during this encounter.   [name], DO

## 2019-10-02 ENCOUNTER — Telehealth: Payer: Self-pay

## 2019-10-02 NOTE — Telephone Encounter (Signed)
Patient called in to get Dr. Etter Sjogren advice to see if it is safe for her to take the Covbid-19 Vaccine .   Please follow up with the patient at [phone]

## 2019-10-02 NOTE — Telephone Encounter (Signed)
yes

## 2019-10-03 NOTE — Telephone Encounter (Signed)
Sent mychrt message to patient.

## 2019-12-21 ENCOUNTER — Other Ambulatory Visit: Payer: Self-pay | Admitting: Family Medicine

## 2019-12-21 DIAGNOSIS — Z1231 Encounter for screening mammogram for malignant neoplasm of breast: Secondary | ICD-10-CM

## 2020-01-19 ENCOUNTER — Encounter: Payer: Self-pay | Admitting: Family Medicine

## 2020-01-19 ENCOUNTER — Ambulatory Visit (INDEPENDENT_AMBULATORY_CARE_PROVIDER_SITE_OTHER): Payer: Medicare Other | Admitting: Family Medicine

## 2020-01-19 ENCOUNTER — Other Ambulatory Visit: Payer: Self-pay

## 2020-01-19 VITALS — BP 119/76 | HR 50 | Temp 97.6°F | Resp 16 | Ht 65.0 in | Wt 183.0 lb

## 2020-01-19 DIAGNOSIS — Z0000 Encounter for general adult medical examination without abnormal findings: Secondary | ICD-10-CM | POA: Diagnosis not present

## 2020-01-19 DIAGNOSIS — E1165 Type 2 diabetes mellitus with hyperglycemia: Secondary | ICD-10-CM | POA: Diagnosis not present

## 2020-01-19 DIAGNOSIS — E1159 Type 2 diabetes mellitus with other circulatory complications: Secondary | ICD-10-CM

## 2020-01-19 DIAGNOSIS — I10 Essential (primary) hypertension: Secondary | ICD-10-CM

## 2020-01-19 DIAGNOSIS — E785 Hyperlipidemia, unspecified: Secondary | ICD-10-CM

## 2020-01-19 NOTE — Patient Instructions (Signed)
Preventive Care 68 Years and Older, Female Preventive care refers to lifestyle choices and visits with your health care provider that can promote health and wellness. This includes:  A yearly physical exam. This is also called an annual well check.  Regular dental and eye exams.  Immunizations.  Screening for certain conditions.  Healthy lifestyle choices, such as diet and exercise. What can I expect for my preventive care visit? Physical exam Your health care provider will check:  Height and weight. These may be used to calculate body mass index (BMI), which is a measurement that tells if you are at a healthy weight.  Heart rate and blood pressure.  Your skin for abnormal spots. Counseling Your health care provider may ask you questions about:  Alcohol, tobacco, and drug use.  Emotional well-being.  Home and relationship well-being.  Sexual activity.  Eating habits.  History of falls.  Memory and ability to understand (cognition).  Work and work Statistician.  Pregnancy and menstrual history. What immunizations do I need?  Influenza (flu) vaccine  This is recommended every year. Tetanus, diphtheria, and pertussis (Tdap) vaccine  You may need a Td booster every 10 years. Varicella (chickenpox) vaccine  You may need this vaccine if you have not already been vaccinated. Zoster (shingles) vaccine  You may need this after age 33. Pneumococcal conjugate (PCV13) vaccine  One dose is recommended after age 33. Pneumococcal polysaccharide (PPSV23) vaccine  One dose is recommended after age 72. Measles, mumps, and rubella (MMR) vaccine  You may need at least one dose of MMR if you were born in 1957 or later. You may also need a second dose. Meningococcal conjugate (MenACWY) vaccine  You may need this if you have certain conditions. Hepatitis A vaccine  You may need this if you have certain conditions or if you travel or work in places where you may be exposed  to hepatitis A. Hepatitis B vaccine  You may need this if you have certain conditions or if you travel or work in places where you may be exposed to hepatitis B. Haemophilus influenzae type b (Hib) vaccine  You may need this if you have certain conditions. You may receive vaccines as individual doses or as more than one vaccine together in one shot (combination vaccines). Talk with your health care provider about the risks and benefits of combination vaccines. What tests do I need? Blood tests  Lipid and cholesterol levels. These may be checked every 5 years, or more frequently depending on your overall health.  Hepatitis C test.  Hepatitis B test. Screening  Lung cancer screening. You may have this screening every year starting at age 39 if you have a 30-pack-year history of smoking and currently smoke or have quit within the past 15 years.  Colorectal cancer screening. All adults should have this screening starting at age 36 and continuing until age 15. Your health care provider may recommend screening at age 23 if you are at increased risk. You will have tests every 1-10 years, depending on your results and the type of screening test.  Diabetes screening. This is done by checking your blood sugar (glucose) after you have not eaten for a while (fasting). You may have this done every 1-3 years.  Mammogram. This may be done every 1-2 years. Talk with your health care provider about how often you should have regular mammograms.  BRCA-related cancer screening. This may be done if you have a family history of breast, ovarian, tubal, or peritoneal cancers.  Other tests  Sexually transmitted disease (STD) testing.  Bone density scan. This is done to screen for osteoporosis. You may have this done starting at age 44. Follow these instructions at home: Eating and drinking  Eat a diet that includes fresh fruits and vegetables, whole grains, lean protein, and low-fat dairy products. Limit  your intake of foods with high amounts of sugar, saturated fats, and salt.  Take vitamin and mineral supplements as recommended by your health care provider.  Do not drink alcohol if your health care provider tells you not to drink.  If you drink alcohol: ? Limit how much you have to 0-1 drink a day. ? Be aware of how much alcohol is in your drink. In the [place] one drink equals one 12 oz bottle of beer (355 mL), one 5 oz glass of wine (148 mL), or one 1 oz glass of hard liquor (44 mL). Lifestyle  Take daily care of your teeth and gums.  Stay active. Exercise for at least 30 minutes on 5 or more days each week.  Do not use any products that contain nicotine or tobacco, such as cigarettes, e-cigarettes, and chewing tobacco. If you need help quitting, ask your health care provider.  If you are sexually active, practice safe sex. Use a condom or other form of protection in order to prevent STIs (sexually transmitted infections).  Talk with your health care provider about taking a low-dose aspirin or statin. What's next?  Go to your health care provider once a year for a well check visit.  Ask your health care provider how often you should have your eyes and teeth checked.  Stay up to date on all vaccines. This information is not intended to replace advice given to you by your health care provider. Make sure you discuss any questions you have with your health care provider. Document Revised: 07/07/2018 Document Reviewed: 07/07/2018 Elsevier Patient Education  2020 [employer].

## 2020-01-19 NOTE — Assessment & Plan Note (Signed)
Encouraged heart healthy diet, increase exercise, avoid trans fats, consider a krill oil cap daily 

## 2020-01-19 NOTE — Assessment & Plan Note (Signed)
Well controlled, no changes to meds. Encouraged heart healthy diet such as the DASH diet and exercise as tolerated.  °
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
## 2020-01-20 ENCOUNTER — Other Ambulatory Visit: Payer: Self-pay | Admitting: Family Medicine

## 2020-01-20 LAB — LIPID PANEL
Cholesterol: 302 mg/dL — ABNORMAL HIGH (ref <200)
HDL: 96 mg/dL (ref >=50)
LDL Cholesterol (Calc): 192 mg/dL (calc) — ABNORMAL HIGH
Non-HDL Cholesterol (Calc): 206 mg/dL (calc) — ABNORMAL HIGH (ref <130)
Total CHOL/HDL Ratio: 3.1 (calc) (ref <5.0)
Triglycerides: 50 mg/dL (ref <150)

## 2020-01-20 LAB — COMPREHENSIVE METABOLIC PANEL
AG Ratio: 1.6 (calc) (ref 1.0–2.5)
ALT: 21 U/L (ref 6–29)
AST: 22 U/L (ref 10–35)
Albumin: 4.4 g/dL (ref 3.6–5.1)
Alkaline phosphatase (APISO): 63 U/L (ref 37–153)
BUN: 13 mg/dL (ref 7–25)
CO2: 32 mmol/L (ref 20–32)
Calcium: 9.5 mg/dL (ref 8.6–10.4)
Chloride: 97 mmol/L — ABNORMAL LOW (ref 98–110)
Creat: 0.87 mg/dL (ref 0.50–0.99)
Globulin: 2.7 g/dL (calc) (ref 1.9–3.7)
Glucose, Bld: 96 mg/dL (ref 65–99)
Potassium: 3.7 mmol/L (ref 3.5–5.3)
Sodium: 137 mmol/L (ref 135–146)
Total Bilirubin: 1.1 mg/dL (ref 0.2–1.2)
Total Protein: 7.1 g/dL (ref 6.1–8.1)

## 2020-01-20 LAB — HEMOGLOBIN A1C
Hgb A1c MFr Bld: 6 % of total Hgb — ABNORMAL HIGH (ref <5.7)
Mean Plasma Glucose: 126 (calc)
eAG (mmol/L): 7 (calc)

## 2020-01-20 LAB — MICROALBUMIN / CREATININE URINE RATIO
Creatinine, Urine: 74 mg/dL (ref 20–275)
Microalb Creat Ratio: 4 mcg/mg creat (ref <30)
Microalb, Ur: 0.3 mg/dL

## 2020-01-20 LAB — EXTRA URINE SPECIMEN

## 2020-01-24 ENCOUNTER — Other Ambulatory Visit: Payer: Self-pay

## 2020-01-24 DIAGNOSIS — E1169 Type 2 diabetes mellitus with other specified complication: Secondary | ICD-10-CM

## 2020-01-25 ENCOUNTER — Ambulatory Visit
Admission: RE | Admit: 2020-01-25 | Discharge: 2020-01-25 | Disposition: A | Discharge status: Home / Self Care | Payer: Medicare Other | Source: Ambulatory Visit | Attending: Family Medicine | Admitting: Family Medicine

## 2020-01-25 ENCOUNTER — Other Ambulatory Visit: Payer: Self-pay

## 2020-01-25 DIAGNOSIS — Z1231 Encounter for screening mammogram for malignant neoplasm of breast: Secondary | ICD-10-CM

## 2020-02-07 ENCOUNTER — Encounter: Payer: Self-pay | Admitting: General Practice

## 2020-06-03 ENCOUNTER — Telehealth: Payer: Self-pay | Admitting: Family Medicine

## 2020-06-03 NOTE — Telephone Encounter (Signed)
Patient got a bill for her visit on 01/19/20 It was code an CPE. PER MEDICARE it needs to be refiled as [facility] visit.

## 2020-06-05 NOTE — Telephone Encounter (Signed)
I have sent an email to [employer] asking them if they can re-file claim as an AWV since patient has traditional Medicare.

## 2020-06-25 ENCOUNTER — Other Ambulatory Visit (HOSPITAL_BASED_OUTPATIENT_CLINIC_OR_DEPARTMENT_OTHER): Payer: Self-pay | Admitting: Internal Medicine

## 2020-06-25 ENCOUNTER — Ambulatory Visit: Payer: Medicare Other | Attending: Internal Medicine

## 2020-06-25 DIAGNOSIS — Z23 Encounter for immunization: Secondary | ICD-10-CM

## 2020-06-25 NOTE — Progress Notes (Signed)
   Covid-19 Vaccination Clinic  Name:  Adriana Collins    MRN: [id number] DOB: [birth year]  06/25/2020  Adriana Collins was observed post Covid-19 immunization for 15 minutes without incident. She was provided with Vaccine Information Sheet and instruction to access the V-Safe system.   Adriana Collins was instructed to call 911 with any severe reactions post vaccine: Marland Kitchen Difficulty breathing  . Swelling of face and throat  . A fast heartbeat  . A bad rash all over body  . Dizziness and weakness   Immunizations Administered    Name Date Dose VIS Date Route   Pfizer COVID-19 Vaccine 06/25/2020  1:07 PM 0.3 mL 05/15/2020 Intramuscular   Manufacturer: [place]   Lot: [id number]   [place]: [id number]

## 2020-07-23 ENCOUNTER — Other Ambulatory Visit: Payer: Self-pay | Admitting: Family Medicine

## 2020-07-23 DIAGNOSIS — I10 Essential (primary) hypertension: Secondary | ICD-10-CM

## 2020-07-23 DIAGNOSIS — Z0000 Encounter for general adult medical examination without abnormal findings: Secondary | ICD-10-CM

## 2020-08-01 ENCOUNTER — Other Ambulatory Visit: Payer: Self-pay

## 2020-08-01 ENCOUNTER — Ambulatory Visit (INDEPENDENT_AMBULATORY_CARE_PROVIDER_SITE_OTHER): Payer: Medicare Other | Admitting: Family Medicine

## 2020-08-01 ENCOUNTER — Other Ambulatory Visit (HOSPITAL_COMMUNITY)
Admission: RE | Admit: 2020-08-01 | Discharge: 2020-08-01 | Disposition: A | Discharge status: Home / Self Care | Payer: Medicare Other | Source: Ambulatory Visit | Attending: Family Medicine | Admitting: Family Medicine

## 2020-08-01 VITALS — BP 114/70 | HR 56 | Temp 97.9°F | Ht 65.0 in | Wt 185.0 lb

## 2020-08-01 DIAGNOSIS — N760 Acute vaginitis: Secondary | ICD-10-CM | POA: Insufficient documentation

## 2020-08-01 LAB — POC URINALSYSI DIPSTICK (AUTOMATED)
Bilirubin, UA: NEGATIVE
Blood, UA: NEGATIVE
Glucose, UA: NEGATIVE
Leukocytes, UA: NEGATIVE
Nitrite, UA: NEGATIVE
Protein, UA: POSITIVE — AB
Spec Grav, UA: >=1.03 — AB (ref 1.010–1.025)
Urobilinogen, UA: 0.2 E.U./dL
pH, UA: 6 (ref 5.0–8.0)

## 2020-08-01 MED ORDER — FLUCONAZOLE 150 MG PO TABS: ORAL_TABLET | ORAL | 0 refills | Status: DC
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
## 2020-08-01 NOTE — Patient Instructions (Signed)

## 2020-08-04 ENCOUNTER — Encounter: Payer: Self-pay | Admitting: Family Medicine

## 2020-08-05 LAB — CERVICOVAGINAL ANCILLARY ONLY
Bacterial Vaginitis (gardnerella): NEGATIVE
Candida Glabrata: NEGATIVE
Candida Vaginitis: NEGATIVE
Comment: NEGATIVE
Comment: NEGATIVE
Comment: NEGATIVE
Comment: NEGATIVE
Trichomonas: NEGATIVE

## 2020-08-15 NOTE — Telephone Encounter (Signed)
I forwarded the original email I sent to [employer] in November to [employer] asking them to follow up on this billing concern for the patient and also asked them to have a representative reach out to the patient regarding this issue.

## 2020-08-15 NOTE — Telephone Encounter (Signed)
The patient called stating medicare has not received a new claim for DOS 01/19/20. Patient would like to issued resolved asap. Patient would like a call back when issued is resolved.

## 2020-08-20 ENCOUNTER — Encounter: Payer: Self-pay | Admitting: Family Medicine

## 2020-08-20 ENCOUNTER — Ambulatory Visit (INDEPENDENT_AMBULATORY_CARE_PROVIDER_SITE_OTHER): Payer: Medicare Other | Admitting: Family Medicine

## 2020-08-20 ENCOUNTER — Other Ambulatory Visit: Payer: Self-pay

## 2020-08-20 VITALS — BP 118/84 | HR 109 | Temp 99.4°F | Resp 18 | Ht 65.0 in | Wt 175.4 lb

## 2020-08-20 DIAGNOSIS — B356 Tinea cruris: Secondary | ICD-10-CM

## 2020-08-20 MED ORDER — BETAMETHASONE DIPROPIONATE AUG 0.05 % EX OINT: TOPICAL_OINTMENT | Freq: Two times a day (BID) | CUTANEOUS | 0 refills | Status: DC

## 2020-08-20 MED ORDER — FLUCONAZOLE 150 MG PO TABS: ORAL_TABLET | ORAL | 0 refills | Status: DC

## 2020-08-20 NOTE — Patient Instructions (Signed)
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
## 2020-09-02 ENCOUNTER — Other Ambulatory Visit: Payer: Self-pay | Admitting: Family Medicine

## 2020-09-02 DIAGNOSIS — I10 Essential (primary) hypertension: Secondary | ICD-10-CM

## 2020-09-26 ENCOUNTER — Ambulatory Visit: Payer: Medicare Other | Admitting: Family Medicine

## 2020-09-27 ENCOUNTER — Ambulatory Visit (INDEPENDENT_AMBULATORY_CARE_PROVIDER_SITE_OTHER): Payer: Medicare Other | Admitting: Family Medicine

## 2020-09-27 ENCOUNTER — Other Ambulatory Visit: Payer: Self-pay

## 2020-09-27 ENCOUNTER — Encounter: Payer: Self-pay | Admitting: Family Medicine

## 2020-09-27 VITALS — BP 110/62 | HR 54 | Temp 97.8°F | Resp 18 | Wt 175.0 lb

## 2020-09-27 DIAGNOSIS — N761 Subacute and chronic vaginitis: Secondary | ICD-10-CM

## 2020-09-27 DIAGNOSIS — N904 Leukoplakia of vulva: Secondary | ICD-10-CM

## 2020-09-27 MED ORDER — CLOBETASOL PROPIONATE 0.05 % EX CREA: 1.0000 "application " | TOPICAL_CREAM | Freq: Two times a day (BID) | CUTANEOUS | 0 refills | Status: DC

## 2020-09-27 MED ORDER — FLUCONAZOLE 150 MG PO TABS: ORAL_TABLET | ORAL | 0 refills | Status: DC

## 2020-09-27 NOTE — Patient Instructions (Signed)
Lichen Sclerosus Lichen sclerosus is a skin problem. It can happen on any part of the body, but it commonly involves the anal and genital areas. It can cause itching and discomfort in these areas. Treatment can help to control symptoms. When the genital area is affected, getting treatment is important because the condition can cause scarring that may lead to other problems if left untreated. What are the causes? The cause of this condition is not known. It may be related to an overactive immune system or a lack of certain hormones. Lichen sclerosus is not an infection or a fungus, and it is not passed from one person to another (non-contagious). What increases the risk? The following factors may make you more likely to develop this condition:  You are a woman who has reached menopause.  You are a man who was not circumcised. This condition may also develop for the first time in children, usually before they enter puberty. What are the signs or symptoms? Symptoms of this condition include:  White areas (plaques) on the skin that may be thin and wrinkled, or thickened.  Red and swollen patches (lesions) on the skin.  Tears or cracks in the skin.  Bruising.  Blood blisters.  Severe itching.  Pain, itching, or burning when urinating. Constipation is also common in children with lichen sclerosus, but can be seen in adults.   How is this diagnosed? This condition may be diagnosed with a physical exam. In some cases, a tissue sample may be removed to be checked under a microscope (biopsy). How is this treated? This condition may be treated with:  Topical steroids. These are medicated creams or ointments that are applied over the affected areas.  Medicines that are taken by mouth.  Topical immunotherapy. These are medicated creams or ointments that are applied over the affected areas. They stimulate your immune system to fight the skin condition. This may be used if steroids are not  effective.  Surgery. This may be needed in more severe cases that are causing problems such as scarring. Follow these instructions at home: Medicines  Take over-the-counter and prescription medicines only as told by your health care provider.  Use creams or ointments as told by your health care provider. Skin care  Do not scratch the affected areas of skin.  If you are a woman, be sure to keep the vaginal area as clean and dry as possible.  Clean the affected area of skin gently with water only. Pat skin dry and avoid the use of rough towels or toilet paper.  Avoid irritating skin products, including soap and scented lotions. Use emollient creams as directed by your health care provider to help reduce itching. General instructions  Keep all follow-up visits. This is important.  Your condition may cause constipation. To prevent or treat constipation, you may need to: ? Drink enough fluid to keep your urine pale yellow. ? Take over-the-counter or prescription medicines. ? Eat foods that are high in fiber, such as beans, whole grains, and fresh fruits and vegetables. ? Limit foods that are high in fat and processed sugars, such as fried or sweet foods. Contact a health care provider if:  You have increasing redness, swelling, or pain in the affected area.  You have fluid, blood, or pus coming from the affected area.  You have new lesions on your skin.  You have a fever.  You have pain during sex. Get help right away if:  You develop severe pain or burning in  the affected areas, especially in the genital area. Summary  Lichen sclerosus is a skin problem. When the genital area is affected, getting treatment is important because the condition can cause scarring that may lead to other problems if left untreated.  This condition is usually treated with medicated creams or ointments (topical steroids) that are applied over the affected areas.  Take or use over-the-counter and  prescription medicines only as told by your health care provider.  Contact a health care provider if you have new lesions on your skin, have pain during sex, or have increasing redness, swelling, or pain in the affected area.  Keep all follow-up visits. This is important. This information is not intended to replace advice given to you by your health care provider. Make sure you discuss any questions you have with your health care provider. Document Revised: 11/25/2019 Document Reviewed: 11/25/2019 Elsevier Patient Education  2021 Elsevier Inc.  
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
## 2020-10-15 ENCOUNTER — Other Ambulatory Visit: Payer: Self-pay | Admitting: Family Medicine

## 2020-10-15 DIAGNOSIS — R21 Rash and other nonspecific skin eruption: Secondary | ICD-10-CM | POA: Diagnosis not present

## 2020-10-15 DIAGNOSIS — I10 Essential (primary) hypertension: Secondary | ICD-10-CM

## 2020-10-15 DIAGNOSIS — Z0000 Encounter for general adult medical examination without abnormal findings: Secondary | ICD-10-CM

## 2020-10-16 ENCOUNTER — Other Ambulatory Visit: Payer: Self-pay

## 2020-10-16 MED ORDER — FREESTYLE LITE TEST VI STRP: ORAL_STRIP | 2 refills | Status: DC

## 2020-11-18 ENCOUNTER — Encounter: Payer: Medicare Other | Admitting: Obstetrics & Gynecology

## 2020-11-25 DIAGNOSIS — B372 Candidiasis of skin and nail: Secondary | ICD-10-CM | POA: Diagnosis not present

## 2020-11-25 DIAGNOSIS — L292 Pruritus vulvae: Secondary | ICD-10-CM | POA: Diagnosis not present

## 2020-12-02 DIAGNOSIS — R21 Rash and other nonspecific skin eruption: Secondary | ICD-10-CM | POA: Diagnosis not present

## 2020-12-04 DIAGNOSIS — R21 Rash and other nonspecific skin eruption: Secondary | ICD-10-CM | POA: Diagnosis not present

## 2020-12-04 DIAGNOSIS — L308 Other specified dermatitis: Secondary | ICD-10-CM | POA: Diagnosis not present

## 2021-01-02 ENCOUNTER — Ambulatory Visit: Payer: Medicare Other | Attending: Internal Medicine

## 2021-01-02 ENCOUNTER — Other Ambulatory Visit (HOSPITAL_BASED_OUTPATIENT_CLINIC_OR_DEPARTMENT_OTHER): Payer: Self-pay

## 2021-01-02 DIAGNOSIS — Z23 Encounter for immunization: Secondary | ICD-10-CM

## 2021-01-02 MED ORDER — COVID-19 MRNA VAC-TRIS(PFIZER) 30 MCG/0.3ML IM SUSP
INTRAMUSCULAR | 0 refills | Status: DC
  Filled 2021-01-02: qty 0.3, 1d supply, fill #0

## 2021-01-02 NOTE — Progress Notes (Signed)
   Covid-19 Vaccination Clinic  Name:  Malissie Musgrave    MRN: [id number] DOB: 08-19-1950  01/02/2021  Ms. Ard was observed post Covid-19 immunization for 15 minutes without incident. She was provided with Vaccine Information Sheet and instruction to access the V-Safe system.   [name] was instructed to call 911 with any severe reactions post vaccine: Difficulty breathing  Swelling of face and throat  A fast heartbeat  A bad rash all over body  Dizziness and weakness   Immunizations Administered     Name Date Dose VIS Date Route   PFIZER Comrnaty(Gray TOP) Covid-19 Vaccine 01/02/2021  2:10 PM 0.3 mL 07/04/2020 Intramuscular   Manufacturer: [employer]   Lot: [id number]   [place]: [phone]

## 2021-01-20 DIAGNOSIS — H35033 Hypertensive retinopathy, bilateral: Secondary | ICD-10-CM | POA: Diagnosis not present

## 2021-01-20 DIAGNOSIS — H0288B Meibomian gland dysfunction left eye, upper and lower eyelids: Secondary | ICD-10-CM | POA: Diagnosis not present

## 2021-01-20 DIAGNOSIS — H0288A Meibomian gland dysfunction right eye, upper and lower eyelids: Secondary | ICD-10-CM | POA: Diagnosis not present

## 2021-01-20 DIAGNOSIS — E119 Type 2 diabetes mellitus without complications: Secondary | ICD-10-CM | POA: Diagnosis not present

## 2021-01-20 DIAGNOSIS — H25013 Cortical age-related cataract, bilateral: Secondary | ICD-10-CM | POA: Diagnosis not present

## 2021-01-20 DIAGNOSIS — H2513 Age-related nuclear cataract, bilateral: Secondary | ICD-10-CM | POA: Diagnosis not present

## 2021-01-20 DIAGNOSIS — H43393 Other vitreous opacities, bilateral: Secondary | ICD-10-CM | POA: Diagnosis not present

## 2021-01-20 DIAGNOSIS — H0100B Unspecified blepharitis left eye, upper and lower eyelids: Secondary | ICD-10-CM | POA: Diagnosis not present

## 2021-01-20 DIAGNOSIS — L814 Other melanin hyperpigmentation: Secondary | ICD-10-CM | POA: Diagnosis not present

## 2021-01-20 DIAGNOSIS — H5203 Hypermetropia, bilateral: Secondary | ICD-10-CM | POA: Diagnosis not present

## 2021-01-20 DIAGNOSIS — H524 Presbyopia: Secondary | ICD-10-CM | POA: Diagnosis not present

## 2021-01-20 DIAGNOSIS — H0100A Unspecified blepharitis right eye, upper and lower eyelids: Secondary | ICD-10-CM | POA: Diagnosis not present

## 2021-02-20 ENCOUNTER — Other Ambulatory Visit: Payer: Self-pay | Admitting: Family Medicine

## 2021-02-20 DIAGNOSIS — I10 Essential (primary) hypertension: Secondary | ICD-10-CM

## 2021-03-17 DIAGNOSIS — J3489 Other specified disorders of nose and nasal sinuses: Secondary | ICD-10-CM | POA: Diagnosis not present

## 2021-04-06 ENCOUNTER — Other Ambulatory Visit: Payer: Self-pay | Admitting: Family Medicine

## 2021-04-06 DIAGNOSIS — I10 Essential (primary) hypertension: Secondary | ICD-10-CM

## 2021-04-06 DIAGNOSIS — Z0000 Encounter for general adult medical examination without abnormal findings: Secondary | ICD-10-CM

## 2021-04-21 ENCOUNTER — Telehealth: Payer: Self-pay | Admitting: Family Medicine

## 2021-04-21 NOTE — Telephone Encounter (Signed)
Last ov 09/2020- nothing in chart regarding any GI issues. Please schedule her an appt w/ Dr. Etter Sjogren. Thank you.

## 2021-04-21 NOTE — Telephone Encounter (Signed)
Patient called in inquiring about a referral to see a Gastroenterology. Please advise.

## 2021-04-30 ENCOUNTER — Encounter: Payer: Self-pay | Admitting: Family Medicine

## 2021-04-30 NOTE — Telephone Encounter (Signed)
Contacted patient and lvm to schedule appointment with Dr. Etter Sjogren.

## 2021-04-30 NOTE — Telephone Encounter (Signed)
noted 

## 2021-05-29 ENCOUNTER — Other Ambulatory Visit: Payer: Self-pay | Admitting: Family Medicine

## 2021-05-29 DIAGNOSIS — I10 Essential (primary) hypertension: Secondary | ICD-10-CM

## 2021-07-01 ENCOUNTER — Encounter: Payer: Self-pay | Admitting: Gastroenterology

## 2021-08-05 ENCOUNTER — Telehealth: Payer: Self-pay | Admitting: Family Medicine

## 2021-08-05 NOTE — Telephone Encounter (Signed)
Left message for patient to call back and schedule Medicare Annual Wellness Visit (AWV) in office.  ° °If not able to come in office, please offer to do virtually or by telephone.  Left office number and my jabber #[phone]. ° °Due for AWVI ° °Please schedule at anytime with Nurse Health Advisor. °  °

## 2021-08-12 ENCOUNTER — Ambulatory Visit (AMBULATORY_SURGERY_CENTER): Payer: Medicare Other

## 2021-08-12 VITALS — Ht 65.0 in | Wt 172.0 lb

## 2021-08-12 DIAGNOSIS — Z800 Family history of malignant neoplasm of digestive organs: Secondary | ICD-10-CM

## 2021-08-12 DIAGNOSIS — Z86010 Personal history of colonic polyps: Secondary | ICD-10-CM

## 2021-08-12 MED ORDER — PEG 3350-KCL-NA BICARB-NACL 420 G PO SOLR: 4000.0000 mL | Freq: Once | ORAL | 0 refills | Status: AC

## 2021-08-12 NOTE — Progress Notes (Signed)
No egg or soy allergy known to patient  No issues known to pt with past sedation with any surgeries or procedures Patient denies ever being told they had issues or difficulty with intubation  No FH of Malignant Hyperthermia Pt is not on diet pills Pt is not on  home 02  Pt is not on blood thinners  Pt denies issues with constipation  No A fib or A flutter  Pt is fully vaccinated  for Covid    NO PA's for preps discussed with pt In PV today  Discussed with pt there will be an out-of-pocket cost for prep and that varies from $0 to 70 +  dollars - pt verbalized understanding   Due to the COVID-19 pandemic we are asking patients to follow certain guidelines in PV and the [facility]   Pt aware of COVID protocols and [facility] guidelines   PV completed over the phone. Pt verified name, DOB, address and insurance during PV today.  Pt mailed instruction packet with copy of consent form to read and not return, and instructions.  Pt encouraged to call with questions or issues.  If pt has My chart, procedure instructions sent via My Chart

## 2021-08-13 ENCOUNTER — Telehealth: Payer: Self-pay | Admitting: Gastroenterology

## 2021-08-13 NOTE — Telephone Encounter (Signed)
Patient called today.  She has a colonoscopy with Dr. Fuller Plan on 1/30; however, she has been having increasing issues with her esophagus and wanted to know if she could possibly have an EGD on the same day.  If not, she wants an appointment to discuss this with the doctor.  She wanted to try to see him before the colonoscopy on the 30th just in case he did want to add the EGD, but there are no open appointments.  Can you please call and discuss patient's options with her?  Thank you.

## 2021-08-14 NOTE — Telephone Encounter (Signed)
Colonoscopy cancelled.  Follow up appointment arranged with Dr. Fuller Plan to discuss her GERD and dysphagia

## 2021-08-21 ENCOUNTER — Encounter: Payer: Self-pay | Admitting: Family Medicine

## 2021-08-21 ENCOUNTER — Ambulatory Visit (INDEPENDENT_AMBULATORY_CARE_PROVIDER_SITE_OTHER): Payer: Medicare Other | Admitting: Family Medicine

## 2021-08-21 ENCOUNTER — Encounter: Payer: Self-pay | Admitting: Gastroenterology

## 2021-08-21 VITALS — BP 110/70 | HR 50 | Temp 97.7°F | Resp 18 | Ht 65.0 in | Wt 177.8 lb

## 2021-08-21 DIAGNOSIS — Z0000 Encounter for general adult medical examination without abnormal findings: Secondary | ICD-10-CM

## 2021-08-21 DIAGNOSIS — I10 Essential (primary) hypertension: Secondary | ICD-10-CM | POA: Diagnosis not present

## 2021-08-21 DIAGNOSIS — E119 Type 2 diabetes mellitus without complications: Secondary | ICD-10-CM

## 2021-08-21 DIAGNOSIS — E785 Hyperlipidemia, unspecified: Secondary | ICD-10-CM | POA: Diagnosis not present

## 2021-08-21 DIAGNOSIS — R1013 Epigastric pain: Secondary | ICD-10-CM

## 2021-08-21 DIAGNOSIS — E1169 Type 2 diabetes mellitus with other specified complication: Secondary | ICD-10-CM | POA: Diagnosis not present

## 2021-08-21 MED ORDER — OMEPRAZOLE 20 MG PO CPDR: 20.0000 mg | DELAYED_RELEASE_CAPSULE | Freq: Every day | ORAL | 3 refills | Status: DC

## 2021-08-21 MED ORDER — HYDROCHLOROTHIAZIDE 25 MG PO TABS: 12.5000 mg | ORAL_TABLET | Freq: Every day | ORAL | 3 refills | Status: DC
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
## 2021-08-21 NOTE — Patient Instructions (Signed)

## 2021-08-21 NOTE — Assessment & Plan Note (Signed)
ghm utd Check labs  

## 2021-08-21 NOTE — Assessment & Plan Note (Signed)
Encourage heart healthy diet such as MIND or DASH diet, increase exercise, avoid trans fats, simple carbohydrates and processed foods, consider a krill or fish or flaxseed oil cap daily.  °

## 2021-08-21 NOTE — Assessment & Plan Note (Signed)
Well controlled, no changes to meds. Encouraged heart healthy diet such as the DASH diet and exercise as tolerated.  °

## 2021-08-22 LAB — MICROALBUMIN / CREATININE URINE RATIO
Creatinine,U: 82.4 mg/dL
Microalb Creat Ratio: 0.8 mg/g (ref 0.0–30.0)
Microalb, Ur: <0.7 mg/dL (ref 0.0–1.9)

## 2021-08-22 LAB — LIPID PANEL
Cholesterol: 293 mg/dL — ABNORMAL HIGH (ref 0–200)
HDL: 93.2 mg/dL (ref >39.00)
LDL Cholesterol: 188 mg/dL — ABNORMAL HIGH (ref 0–99)
NonHDL: 199.87
Total CHOL/HDL Ratio: 3
Triglycerides: 57 mg/dL (ref 0.0–149.0)
VLDL: 11.4 mg/dL (ref 0.0–40.0)

## 2021-08-22 LAB — CBC WITH DIFFERENTIAL/PLATELET
Basophils Absolute: 0.1 10*3/uL (ref 0.0–0.1)
Basophils Relative: 1.6 % (ref 0.0–3.0)
Eosinophils Absolute: 0 10*3/uL (ref 0.0–0.7)
Eosinophils Relative: 1.2 % (ref 0.0–5.0)
HCT: 39 % (ref 36.0–46.0)
Hemoglobin: 12.9 g/dL (ref 12.0–15.0)
Lymphocytes Relative: 44.3 % (ref 12.0–46.0)
Lymphs Abs: 1.6 10*3/uL (ref 0.7–4.0)
MCHC: 33.2 g/dL (ref 30.0–36.0)
MCV: 92.5 fl (ref 78.0–100.0)
Monocytes Absolute: 0.4 10*3/uL (ref 0.1–1.0)
Monocytes Relative: 10.5 % (ref 3.0–12.0)
Neutro Abs: 1.6 10*3/uL (ref 1.4–7.7)
Neutrophils Relative %: 42.4 % — ABNORMAL LOW (ref 43.0–77.0)
Platelets: 316 10*3/uL (ref 150.0–400.0)
RBC: 4.22 Mil/uL (ref 3.87–5.11)
RDW: 13.9 % (ref 11.5–15.5)
WBC: 3.7 10*3/uL — ABNORMAL LOW (ref 4.0–10.5)

## 2021-08-22 LAB — COMPREHENSIVE METABOLIC PANEL
ALT: 21 U/L (ref 0–35)
AST: 23 U/L (ref 0–37)
Albumin: 4.4 g/dL (ref 3.5–5.2)
Alkaline Phosphatase: 54 U/L (ref 39–117)
BUN: 14 mg/dL (ref 6–23)
CO2: 30 mEq/L (ref 19–32)
Calcium: 9.3 mg/dL (ref 8.4–10.5)
Chloride: 96 mEq/L (ref 96–112)
Creatinine, Ser: 0.86 mg/dL (ref 0.40–1.20)
GFR: 69.06 mL/min (ref >60.00)
Glucose, Bld: 85 mg/dL (ref 70–99)
Potassium: 4.1 mEq/L (ref 3.5–5.1)
Sodium: 136 mEq/L (ref 135–145)
Total Bilirubin: 1.3 mg/dL — ABNORMAL HIGH (ref 0.2–1.2)
Total Protein: 7.1 g/dL (ref 6.0–8.3)

## 2021-08-22 LAB — HEMOGLOBIN A1C: Hgb A1c MFr Bld: 6 % (ref 4.6–6.5)

## 2021-08-25 ENCOUNTER — Encounter: Payer: TRICARE For Life (TFL) | Admitting: Gastroenterology

## 2021-08-27 ENCOUNTER — Other Ambulatory Visit: Payer: Self-pay | Admitting: Family Medicine

## 2021-08-27 DIAGNOSIS — E785 Hyperlipidemia, unspecified: Secondary | ICD-10-CM

## 2021-08-27 DIAGNOSIS — E119 Type 2 diabetes mellitus without complications: Secondary | ICD-10-CM

## 2021-08-27 DIAGNOSIS — E1169 Type 2 diabetes mellitus with other specified complication: Secondary | ICD-10-CM

## 2021-09-01 ENCOUNTER — Other Ambulatory Visit: Payer: Self-pay

## 2021-09-01 MED ORDER — EZETIMIBE 10 MG PO TABS: 10.0000 mg | ORAL_TABLET | Freq: Every day | ORAL | 2 refills | Status: DC

## 2021-09-03 ENCOUNTER — Other Ambulatory Visit: Payer: Self-pay | Admitting: Family Medicine

## 2021-09-03 DIAGNOSIS — Z0000 Encounter for general adult medical examination without abnormal findings: Secondary | ICD-10-CM

## 2021-09-03 DIAGNOSIS — I10 Essential (primary) hypertension: Secondary | ICD-10-CM

## 2021-09-04 ENCOUNTER — Encounter: Payer: Self-pay | Admitting: Gastroenterology

## 2021-09-04 ENCOUNTER — Ambulatory Visit (INDEPENDENT_AMBULATORY_CARE_PROVIDER_SITE_OTHER): Payer: Medicare Other | Admitting: Gastroenterology

## 2021-09-04 VITALS — BP 108/70 | HR 64 | Ht 65.0 in | Wt 175.0 lb

## 2021-09-04 DIAGNOSIS — K219 Gastro-esophageal reflux disease without esophagitis: Secondary | ICD-10-CM

## 2021-09-04 DIAGNOSIS — Z800 Family history of malignant neoplasm of digestive organs: Secondary | ICD-10-CM

## 2021-09-04 MED ORDER — PLENVU 140 G PO SOLR: 1.0000 | Freq: Once | ORAL | 0 refills | Status: AC

## 2021-09-04 NOTE — Patient Instructions (Signed)
You have been scheduled for an endoscopy and colonoscopy. Please follow the written instructions given to you at your visit today. Please pick up your prep supplies at the pharmacy within the next 1-3 days. If you use inhalers (even only as needed), please bring them with you on the day of your procedure.  Continue omeprazole daily.   Patient advised to avoid spicy, acidic, citrus, chocolate, mints, fruit and fruit juices.  Limit the intake of caffeine, alcohol and Soda.  Don't exercise too soon after eating.  Don't lie down within 3-4 hours of eating.  Elevate the head of your bed.  The [facility] providers would like to encourage you to use [facility] to communicate with providers for non-urgent requests or questions.  Due to long hold times on the telephone, sending your provider a message by [facility] may be a faster and more efficient way to get a response.  Please allow 48 business hours for a response.  Please remember that this is for non-urgent requests.   Due to recent changes in healthcare laws, you may see the results of your imaging and laboratory studies on MyChart before your provider has had a chance to review them.  We understand that in some cases there may be results that are confusing or concerning to you. Not all laboratory results come back in the same time frame and the provider may be waiting for multiple results in order to interpret others.  Please give [place] 48 hours in order for your provider to thoroughly review all the results before contacting the office for clarification of your results.   Thank you for choosing me and [facility].  Pricilla Riffle. Dagoberto Ligas., MD., [facility] Regal
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
## 2021-09-30 ENCOUNTER — Encounter: Payer: Self-pay | Admitting: Family Medicine

## 2021-10-08 ENCOUNTER — Ambulatory Visit: Payer: Medicare Other | Admitting: Gastroenterology

## 2021-10-08 ENCOUNTER — Encounter: Payer: Self-pay | Admitting: Gastroenterology

## 2021-10-08 ENCOUNTER — Other Ambulatory Visit: Payer: Self-pay

## 2021-10-08 VITALS — BP 107/75 | HR 80 | Temp 96.0°F | Resp 18 | Ht 65.0 in | Wt 175.0 lb

## 2021-10-08 DIAGNOSIS — K219 Gastro-esophageal reflux disease without esophagitis: Secondary | ICD-10-CM

## 2021-10-08 DIAGNOSIS — Z86010 Personal history of colonic polyps: Secondary | ICD-10-CM

## 2021-10-08 DIAGNOSIS — Z860101 Personal history of adenomatous and serrated colon polyps: Secondary | ICD-10-CM

## 2021-10-08 DIAGNOSIS — Z800 Family history of malignant neoplasm of digestive organs: Secondary | ICD-10-CM

## 2021-10-08 HISTORY — PX: UPPER GASTROINTESTINAL ENDOSCOPY: SHX188

## 2021-10-08 HISTORY — PX: COLONOSCOPY WITH PROPOFOL: SHX5780

## 2021-10-08 MED ORDER — SODIUM CHLORIDE 0.9 % IV SOLN: 500.0000 mL | Freq: Once | INTRAVENOUS | Status: DC

## 2021-10-08 NOTE — Progress Notes (Signed)
10/08/21- pt was given a copy of ekg strips and 12 lead EKG done her at [facility] to take with her to Dr Carollee Herter . ?

## 2021-10-08 NOTE — Progress Notes (Signed)
Pt in Afib, confirmed with 12lead. HR from 70's-120's. B/P stable pt reports can not fill the palpitations.  ? ?
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
## 2021-10-08 NOTE — Progress Notes (Signed)
Pt's states no medical or surgical changes since previsit or office visit.  ° °VS DT °

## 2021-10-08 NOTE — Progress Notes (Signed)
10/08/21 1445- Dr Fuller Plan has spoken to Dr Carollee Herter ,pt's PCP ,concerning new onset of A FIB Pt aware and will contact Dr Carollee Herter when she gets home as Dr Fuller Plan ordered . Pt felt "fine " when left per W/C by transporter to her husband's carCB Iv was d/c'd by T Berry,CRNA prior to pt getting dressed .  ?

## 2021-10-09 ENCOUNTER — Other Ambulatory Visit: Payer: Self-pay

## 2021-10-10 ENCOUNTER — Telehealth: Payer: Self-pay | Admitting: *Deleted

## 2021-10-10 NOTE — Telephone Encounter (Signed)
Talked with patient but she's doing well, will speak wit cardiologist before scheduling colonoscopy.  ? ?

## 2021-10-10 NOTE — Telephone Encounter (Signed)
Attempted to call patient for their post-procedure follow-up call. No answer. Left voicemail.   

## 2021-10-13 ENCOUNTER — Ambulatory Visit (INDEPENDENT_AMBULATORY_CARE_PROVIDER_SITE_OTHER): Payer: Medicare Other | Admitting: Internal Medicine

## 2021-10-13 ENCOUNTER — Other Ambulatory Visit: Payer: Self-pay

## 2021-10-13 VITALS — BP 120/62 | HR 52 | Ht 65.0 in | Wt 178.0 lb

## 2021-10-13 DIAGNOSIS — I4891 Unspecified atrial fibrillation: Secondary | ICD-10-CM | POA: Diagnosis not present

## 2021-10-13 MED ORDER — METOPROLOL SUCCINATE ER 25 MG PO TB24: 12.5000 mg | ORAL_TABLET | Freq: Every day | ORAL | 3 refills | Status: DC

## 2021-10-13 MED ORDER — APIXABAN 5 MG PO TABS: 5.0000 mg | ORAL_TABLET | Freq: Two times a day (BID) | ORAL | 6 refills | Status: DC

## 2021-10-13 NOTE — Patient Instructions (Addendum)
Medication Instructions:  ?START: METOPROLOL SUCCINATE 12.'5mg'$  ONCE DAILY  ?START: ELIQUIS '5mg'$  TWICE DAILY  ?STOP: ASPIRIN  ?*If you need a refill on your cardiac medications before your next appointment, please call your pharmacy* ? ?Follow-Up: ?At [facility], you and your health needs are our priority.  As part of our continuing mission to provide you with exceptional heart care, we have created designated Provider Care Teams.  These Care Teams include your primary Cardiologist (physician) and Advanced Practice Providers (APPs -  Physician Assistants and Nurse Practitioners) who all work together to provide you with the care you need, when you need it. ? ?Your next appointment:   ?3 month(s) ? ?The format for your next appointment:   ?In Person ? ?Provider:   ?Janina Mayo, MD  ?
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
## 2021-11-06 ENCOUNTER — Encounter: Payer: Self-pay | Admitting: Family Medicine

## 2021-11-06 ENCOUNTER — Ambulatory Visit (INDEPENDENT_AMBULATORY_CARE_PROVIDER_SITE_OTHER): Payer: Medicare Other | Admitting: Family Medicine

## 2021-11-06 VITALS — BP 100/70 | HR 61 | Temp 98.4°F | Resp 16 | Ht 65.0 in | Wt 178.0 lb

## 2021-11-06 DIAGNOSIS — J40 Bronchitis, not specified as acute or chronic: Secondary | ICD-10-CM

## 2021-11-06 MED ORDER — AZITHROMYCIN 250 MG PO TABS: ORAL_TABLET | ORAL | 0 refills | Status: DC

## 2021-11-06 MED ORDER — PROMETHAZINE-DM 6.25-15 MG/5ML PO SYRP: 5.0000 mL | ORAL_SOLUTION | Freq: Four times a day (QID) | ORAL | 0 refills | Status: DC | PRN
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
## 2021-11-06 NOTE — Patient Instructions (Signed)
Acute Bronchitis, Adult °Acute bronchitis is sudden inflammation of the main airways (bronchi) that come off the windpipe (trachea) in the lungs. The swelling causes the airways to get smaller and make more mucus than normal. This can make it hard to breathe and can cause coughing or noisy breathing (wheezing). °Acute bronchitis may last several weeks. The cough may last longer. Allergies, asthma, and exposure to smoke may make the condition worse. °What are the causes? °This condition can be caused by germs and by substances that irritate the lungs, including: °Cold and flu viruses. The most common cause of this condition is the virus that causes the common cold. °Bacteria. This is less common. °Breathing in substances that irritate the lungs, including: °Smoke from cigarettes and other forms of tobacco. °Dust and pollen. °Fumes from household cleaning products, gases, or burned fuel. °Indoor or outdoor air pollution. °What increases the risk? °The following factors may make you more likely to develop this condition: °A weak body's defense system, also called the immune system. °A condition that affects your lungs and breathing, such as asthma. °What are the signs or symptoms? °Common symptoms of this condition include: °Coughing. This may bring up clear, yellow, or green mucus from your lungs (sputum). °Wheezing. °Runny or stuffy nose. °Having too much mucus in your lungs (chest congestion). °Shortness of breath. °Aches and pains, including sore throat or chest. °How is this diagnosed? °This condition is usually diagnosed based on: °Your symptoms and medical history. °A physical exam. °You may also have other tests, including tests to rule out other conditions, such as pneumonia. These tests include: °A test of lung function. °Test of a mucus sample to look for the presence of bacteria. °Tests to check the oxygen level in your blood. °Blood tests. °Chest X-ray. °How is this treated? °Most cases of acute bronchitis  clear up over time without treatment. Your health care provider may recommend: °Drinking more fluids to help thin your mucus so it is easier to cough up. °Taking inhaled medicine (inhaler) to improve air flow in and out of your lungs. °Using a vaporizer or a humidifier. These are machines that add water to the air to help you breathe better. °Taking a medicine that thins mucus and clears congestion (expectorant). °Taking a medicine that prevents or stops coughing (cough suppressant). °It is notcommon to take an antibiotic medicine for this condition. °Follow these instructions at home: ° °Take over-the-counter and prescription medicines only as told by your health care provider. °Use an inhaler, vaporizer, or humidifier as told by your health care provider. °Take two teaspoons (10 mL) of honey at bedtime to lessen coughing at night. °Drink enough fluid to keep your urine pale yellow. °Do not use any products that contain nicotine or tobacco. These products include cigarettes, chewing tobacco, and vaping devices, such as e-cigarettes. If you need help quitting, ask your health care provider. °Get plenty of rest. °Return to your normal activities as told by your health care provider. Ask your health care provider what activities are safe for you. °Keep all follow-up visits. This is important. °How is this prevented? °To lower your risk of getting this condition again: °Wash your hands often with soap and water for at least 20 seconds. If soap and water are not available, use hand sanitizer. °Avoid contact with people who have cold symptoms. °Try not to touch your mouth, nose, or eyes with your hands. °Avoid breathing in smoke or chemical fumes. Breathing smoke or chemical fumes will make your condition   worse. °Get the flu shot every year. °Contact a health care provider if: °Your symptoms do not improve after 2 weeks. °You have trouble coughing up the mucus. °Your cough keeps you awake at night. °You have a  fever. °Get help right away if you: °Cough up blood. °Feel pain in your chest. °Have severe shortness of breath. °Faint or keep feeling like you are going to faint. °Have a severe headache. °Have a fever or chills that get worse. °These symptoms may represent a serious problem that is an emergency. Do not wait to see if the symptoms will go away. Get medical help right away. Call your local emergency services (911 in the [place] Do not drive yourself to the hospital. °Summary °Acute bronchitis is inflammation of the main airways (bronchi) that come off the windpipe (trachea) in the lungs. The swelling causes the airways to get smaller and make more mucus than normal. °Drinking more fluids can help thin your mucus so it is easier to cough up. °Take over-the-counter and prescription medicines only as told by your health care provider. °Do not use any products that contain nicotine or tobacco. These products include cigarettes, chewing tobacco, and vaping devices, such as e-cigarettes. If you need help quitting, ask your health care provider. °Contact a health care provider if your symptoms do not improve after 2 weeks. °This information is not intended to replace advice given to you by your health care provider. Make sure you discuss any questions you have with your health care provider. °Document Revised: 11/13/2020 Document Reviewed: 11/13/2020 °Elsevier Patient Education © 2022 [employer] ° °

## 2021-11-06 NOTE — Assessment & Plan Note (Signed)
covid neg  ?z pak and cough med per orders  ?If no better  Call or rto ?

## 2021-11-18 ENCOUNTER — Encounter: Payer: Self-pay | Admitting: Internal Medicine

## 2021-11-18 ENCOUNTER — Other Ambulatory Visit: Payer: Self-pay | Admitting: Family Medicine

## 2021-11-18 MED ORDER — METOPROLOL SUCCINATE ER 25 MG PO TB24: 12.5000 mg | ORAL_TABLET | Freq: Every day | ORAL | 3 refills | Status: DC

## 2021-11-18 MED ORDER — APIXABAN 5 MG PO TABS
5.0000 mg | ORAL_TABLET | Freq: Two times a day (BID) | ORAL | 1 refills | Status: DC
Start: 2021-11-18 — End: 2022-05-22

## 2021-11-25 DIAGNOSIS — Z7901 Long term (current) use of anticoagulants: Secondary | ICD-10-CM | POA: Diagnosis not present

## 2021-11-25 DIAGNOSIS — I480 Paroxysmal atrial fibrillation: Secondary | ICD-10-CM | POA: Diagnosis not present

## 2021-11-25 DIAGNOSIS — R002 Palpitations: Secondary | ICD-10-CM | POA: Diagnosis not present

## 2021-12-04 DIAGNOSIS — R102 Pelvic and perineal pain: Secondary | ICD-10-CM | POA: Diagnosis not present

## 2021-12-11 ENCOUNTER — Ambulatory Visit (INDEPENDENT_AMBULATORY_CARE_PROVIDER_SITE_OTHER): Payer: Medicare Other | Admitting: Family Medicine

## 2021-12-11 ENCOUNTER — Encounter: Payer: Self-pay | Admitting: Family Medicine

## 2021-12-11 VITALS — BP 126/82 | HR 76 | Temp 98.2°F | Resp 18 | Ht 65.0 in | Wt 181.8 lb

## 2021-12-11 DIAGNOSIS — J3489 Other specified disorders of nose and nasal sinuses: Secondary | ICD-10-CM | POA: Insufficient documentation

## 2021-12-11 MED ORDER — MUPIROCIN 2 % EX OINT: 1.0000 "application " | TOPICAL_OINTMENT | Freq: Two times a day (BID) | CUTANEOUS | 1 refills | Status: DC
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
## 2021-12-11 NOTE — Assessment & Plan Note (Signed)
bactroban ointment  rto prn

## 2021-12-11 NOTE — Patient Instructions (Signed)
Mupirocin Cream or Ointment What is this medication? MUPIROCIN (myoo PEER oh sin) treats skin infections caused by bacteria. It works by killing or preventing the growth of bacteria on the skin. This medicine may be used for other purposes; ask your health care provider or pharmacist if you have questions. COMMON BRAND NAME(S): Bactroban, Centany, Centany AT What should I tell my care team before I take this medication? They need to know if you have any of these conditions: Kidney disease Large areas of burned or damaged skin Stomach or intestine problems such as colitis An unusual or allergic reaction to mupirocin, other medications, foods, dyes, or preservatives Pregnant or trying to get pregnant Breast-feeding How should I use this medication? This medication is for external use only. Do not take by mouth. Wash your hands before and after use. If you are treating a hand infection, only wash your hands before use. Do not get it in your eyes. If you do, rinse your eyes with plenty of cool tap water. Use it as directed on the prescription label at the same time every day. Do not use it more often than directed. Use the medication for the full course as directed by your care team, even if you think you are better. Do not stop using it unless your care team tells you to stop it early. Apply a thin film of the medication to the affected area. You can cover the area with a sterile gauze dressing (bandage). Do not use an airtight bandage (such as a plastic-covered bandage). Talk to your care team about the use of this medication in children. While it may be prescribed for children for selected conditions, precautions do apply. Overdosage: If you think you have taken too much of this medicine contact a poison control center or emergency room at once. NOTE: This medicine is only for you. Do not share this medicine with others. What if I miss a dose? If you miss a dose, use it as soon as you can. If it  is almost time for your next dose, use only that dose. Do not use double or extra doses. What may interact with this medication? Interactions are not expected. Do not use any other skin products on the affected area without telling your care team. This list may not describe all possible interactions. Give your health care provider a list of all the medicines, herbs, non-prescription drugs, or dietary supplements you use. Also tell them if you smoke, drink alcohol, or use illegal drugs. Some items may interact with your medicine. What should I watch for while using this medication? Tell your care team if your symptoms do not start to get better or if they get worse. Do not treat diarrhea with over the counter products. Contact your care team if you have diarrhea that lasts more than 2 days or if it is severe and watery. What side effects may I notice from receiving this medication? Side effects that you should report to your care team as soon as possible: Allergic reactions--skin rash, itching, hives, swelling of the face, lips, tongue, or throat Burning, itching, crusting, or peeling of treated skin Severe diarrhea, fever Side effects that usually do not require medical attention (report to your care team if they continue or are bothersome): Mild skin irritation, redness, or dryness This list may not describe all possible side effects. Call your doctor for medical advice about side effects. You may report side effects to FDA at 1-800-FDA-1088. Where should I keep my  medication? Keep out of the reach of children and pets. Store at room temperature between 20 and 25 degrees C (68 and 77 degrees F). Do not freeze. Get rid of any unused medication after the expiration date. NOTE: This sheet is a summary. It may not cover all possible information. If you have questions about this medicine, talk to your doctor, pharmacist, or health care provider.  2023 Elsevier/Gold Standard (2020-11-17 00:00:00)

## 2021-12-18 DIAGNOSIS — D251 Intramural leiomyoma of uterus: Secondary | ICD-10-CM | POA: Diagnosis not present

## 2021-12-18 DIAGNOSIS — R102 Pelvic and perineal pain: Secondary | ICD-10-CM | POA: Diagnosis not present

## 2021-12-18 DIAGNOSIS — N83311 Acquired atrophy of right ovary: Secondary | ICD-10-CM | POA: Diagnosis not present

## 2021-12-18 DIAGNOSIS — N859 Noninflammatory disorder of uterus, unspecified: Secondary | ICD-10-CM | POA: Diagnosis not present

## 2021-12-30 DIAGNOSIS — I480 Paroxysmal atrial fibrillation: Secondary | ICD-10-CM | POA: Diagnosis not present

## 2022-01-07 ENCOUNTER — Other Ambulatory Visit: Payer: Self-pay | Admitting: Family Medicine

## 2022-01-07 DIAGNOSIS — R1013 Epigastric pain: Secondary | ICD-10-CM

## 2022-01-13 ENCOUNTER — Ambulatory Visit (INDEPENDENT_AMBULATORY_CARE_PROVIDER_SITE_OTHER): Payer: Medicare Other | Admitting: Family Medicine

## 2022-01-13 ENCOUNTER — Telehealth: Payer: Self-pay | Admitting: *Deleted

## 2022-01-13 ENCOUNTER — Encounter: Payer: Self-pay | Admitting: Family Medicine

## 2022-01-13 VITALS — BP 100/70 | HR 83 | Temp 97.7°F | Resp 18 | Ht 65.0 in | Wt 177.8 lb

## 2022-01-13 DIAGNOSIS — E785 Hyperlipidemia, unspecified: Secondary | ICD-10-CM

## 2022-01-13 DIAGNOSIS — E1169 Type 2 diabetes mellitus with other specified complication: Secondary | ICD-10-CM

## 2022-01-13 DIAGNOSIS — R1013 Epigastric pain: Secondary | ICD-10-CM

## 2022-01-13 DIAGNOSIS — E8881 Metabolic syndrome: Secondary | ICD-10-CM

## 2022-01-13 DIAGNOSIS — E119 Type 2 diabetes mellitus without complications: Secondary | ICD-10-CM

## 2022-01-13 LAB — LIPID PANEL
Cholesterol: 291 mg/dL — ABNORMAL HIGH (ref 0–200)
HDL: 94.8 mg/dL (ref >39.00)
LDL Cholesterol: 184 mg/dL — ABNORMAL HIGH (ref 0–99)
NonHDL: 196.14
Total CHOL/HDL Ratio: 3
Triglycerides: 59 mg/dL (ref 0.0–149.0)
VLDL: 11.8 mg/dL (ref 0.0–40.0)

## 2022-01-13 LAB — COMPREHENSIVE METABOLIC PANEL
ALT: 19 U/L (ref 0–35)
AST: 22 U/L (ref 0–37)
Albumin: 4.2 g/dL (ref 3.5–5.2)
Alkaline Phosphatase: 66 U/L (ref 39–117)
BUN: 12 mg/dL (ref 6–23)
CO2: 30 mEq/L (ref 19–32)
Calcium: 9.5 mg/dL (ref 8.4–10.5)
Chloride: 98 mEq/L (ref 96–112)
Creatinine, Ser: 0.91 mg/dL (ref 0.40–1.20)
GFR: 64.36 mL/min (ref >60.00)
Glucose, Bld: 91 mg/dL (ref 70–99)
Potassium: 4.1 mEq/L (ref 3.5–5.1)
Sodium: 136 mEq/L (ref 135–145)
Total Bilirubin: 0.9 mg/dL (ref 0.2–1.2)
Total Protein: 7.2 g/dL (ref 6.0–8.3)

## 2022-01-13 LAB — HEMOGLOBIN A1C: Hgb A1c MFr Bld: 6.2 % (ref 4.6–6.5)

## 2022-01-13 LAB — MICROALBUMIN / CREATININE URINE RATIO
Creatinine,U: 341.4 mg/dL
Microalb Creat Ratio: 0.5 mg/g (ref 0.0–30.0)
Microalb, Ur: 1.7 mg/dL (ref 0.0–1.9)

## 2022-01-13 MED ORDER — OMEPRAZOLE 20 MG PO CPDR: DELAYED_RELEASE_CAPSULE | ORAL | 3 refills | Status: DC

## 2022-01-13 NOTE — Telephone Encounter (Signed)
Pt completed labs this morning. During 1st venipuncture attempt pt told me she felt burning down in to her hand. Needle was withdrawn from pt's arm and bandage applied . Successful venipuncture completed in left arm. I advised pt she could apply ice pack for further discomfort at home and that she could experience symptom for 1 to 2 weeks as it is possible that a nerve was hit during venipuncture.  I will follow up with pt tomorrow for a status check. Please advise if any further instructions to relay to patient.

## 2022-01-13 NOTE — Assessment & Plan Note (Signed)
Check labs 

## 2022-01-13 NOTE — Assessment & Plan Note (Addendum)
Encourage heart healthy diet such as MIND or DASH diet, increase exercise, avoid trans fats, simple carbohydrates and processed foods, consider a krill or fish or flaxseed oil cap daily.  She will discuss options with cardiology Stain intolerant  And intolerant to zetia
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
## 2022-01-13 NOTE — Patient Instructions (Signed)

## 2022-01-13 NOTE — Assessment & Plan Note (Signed)
Tolerating statin, encouraged heart healthy diet, avoid trans fats, minimize simple carbs and saturated fats. Increase exercise as tolerated 

## 2022-01-14 ENCOUNTER — Telehealth: Payer: Self-pay | Admitting: Family Medicine

## 2022-01-14 NOTE — Telephone Encounter (Signed)
Left message for patient to call back and schedule Medicare Annual Wellness Visit (AWV).   Please offer to do virtually or by telephone.  Left office number and my jabber #[phone].  AWVI eligible as of 06/26/2018  Please schedule at anytime with Nurse Health Advisor.   

## 2022-01-14 NOTE — Telephone Encounter (Signed)
Called pt for a status update.  Left message for pt to return my call.

## 2022-01-14 NOTE — Telephone Encounter (Signed)
Pt returned my call.  Reports that she is having very few symptoms now. Some occasional tingling but overall feels much improvement. Pt will let us know if she has worsening of symptoms and was reminded to use ice pack for continued symptoms. Pt voices understanding.

## 2022-01-15 ENCOUNTER — Ambulatory Visit: Payer: Medicare Other | Admitting: Internal Medicine

## 2022-01-19 DIAGNOSIS — I480 Paroxysmal atrial fibrillation: Secondary | ICD-10-CM | POA: Diagnosis not present

## 2022-01-21 DIAGNOSIS — E119 Type 2 diabetes mellitus without complications: Secondary | ICD-10-CM | POA: Diagnosis not present

## 2022-01-21 DIAGNOSIS — H43393 Other vitreous opacities, bilateral: Secondary | ICD-10-CM | POA: Diagnosis not present

## 2022-01-21 DIAGNOSIS — H2513 Age-related nuclear cataract, bilateral: Secondary | ICD-10-CM | POA: Diagnosis not present

## 2022-01-21 DIAGNOSIS — H40003 Preglaucoma, unspecified, bilateral: Secondary | ICD-10-CM | POA: Diagnosis not present

## 2022-01-21 DIAGNOSIS — L814 Other melanin hyperpigmentation: Secondary | ICD-10-CM | POA: Diagnosis not present

## 2022-01-21 DIAGNOSIS — H5203 Hypermetropia, bilateral: Secondary | ICD-10-CM | POA: Diagnosis not present

## 2022-01-21 DIAGNOSIS — H25013 Cortical age-related cataract, bilateral: Secondary | ICD-10-CM | POA: Diagnosis not present

## 2022-01-21 DIAGNOSIS — L249 Irritant contact dermatitis, unspecified cause: Secondary | ICD-10-CM | POA: Diagnosis not present

## 2022-01-21 DIAGNOSIS — H524 Presbyopia: Secondary | ICD-10-CM | POA: Diagnosis not present

## 2022-01-21 DIAGNOSIS — L821 Other seborrheic keratosis: Secondary | ICD-10-CM | POA: Diagnosis not present

## 2022-01-21 DIAGNOSIS — H35033 Hypertensive retinopathy, bilateral: Secondary | ICD-10-CM | POA: Diagnosis not present

## 2022-02-10 ENCOUNTER — Telehealth: Payer: Self-pay | Admitting: Family Medicine

## 2022-02-10 DIAGNOSIS — R1013 Epigastric pain: Secondary | ICD-10-CM

## 2022-02-10 MED ORDER — OMEPRAZOLE 20 MG PO CPDR: DELAYED_RELEASE_CAPSULE | ORAL | 3 refills | Status: DC

## 2022-02-10 NOTE — Telephone Encounter (Signed)
Pt called stating that she needed her omeprazole sent to the following pharmacy going forward:  [place], [place]  [address] [place] [address]  Phone:  [phone]  Fax:  [phone]

## 2022-02-10 NOTE — Telephone Encounter (Signed)
Refill sent.

## 2022-02-11 ENCOUNTER — Other Ambulatory Visit: Payer: Self-pay | Admitting: Family Medicine

## 2022-02-11 DIAGNOSIS — R1013 Epigastric pain: Secondary | ICD-10-CM

## 2022-02-17 NOTE — Telephone Encounter (Signed)
error 

## 2022-03-06 DIAGNOSIS — H401111 Primary open-angle glaucoma, right eye, mild stage: Secondary | ICD-10-CM | POA: Diagnosis not present

## 2022-03-06 DIAGNOSIS — H40002 Preglaucoma, unspecified, left eye: Secondary | ICD-10-CM | POA: Diagnosis not present

## 2022-05-08 ENCOUNTER — Encounter: Payer: Self-pay | Admitting: Family Medicine

## 2022-05-08 ENCOUNTER — Ambulatory Visit (INDEPENDENT_AMBULATORY_CARE_PROVIDER_SITE_OTHER): Payer: Medicare Other | Admitting: Family Medicine

## 2022-05-08 VITALS — BP 100/80 | HR 74 | Temp 98.8°F | Resp 18 | Ht 65.0 in | Wt 177.4 lb

## 2022-05-08 DIAGNOSIS — L0291 Cutaneous abscess, unspecified: Secondary | ICD-10-CM | POA: Diagnosis not present

## 2022-05-08 MED ORDER — DOXYCYCLINE HYCLATE 100 MG PO TABS: 100.0000 mg | ORAL_TABLET | Freq: Two times a day (BID) | ORAL | 0 refills | Status: DC

## 2022-05-08 MED ORDER — CEFTRIAXONE SODIUM 1 G IJ SOLR
1.0000 g | Freq: Once | INTRAMUSCULAR | Status: AC
  Administered 2022-05-08: 1 g via INTRAMUSCULAR
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
## 2022-05-08 NOTE — Patient Instructions (Signed)
Skin Abscess  A skin abscess is an infected area on or under your skin that contains a collection of pus and other material. An abscess may also be called a furuncle, carbuncle, or boil. An abscess can occur in or on almost any part of your body. Some abscesses break open (rupture) on their own. Most continue to get worse unless they are treated. The infection can spread deeper into the body and eventually into your blood, which can make you feel ill. Treatment usually involves draining the abscess. What are the causes? An abscess occurs when germs, like bacteria, pass through your skin and cause an infection. This may be caused by: A scrape or cut on your skin. A puncture wound through your skin, including a needle injection or insect bite. Blocked oil or sweat glands. Blocked and infected hair follicles. A cyst that forms beneath your skin (sebaceous cyst) and becomes infected. What increases the risk? This condition is more likely to develop in people who: Have a weak body defense system (immune system). Have diabetes. Have dry and irritated skin. Get frequent injections or use illegal IV drugs. Have a foreign body in a wound, such as a splinter. Have problems with their lymph system or veins. What are the signs or symptoms? Symptoms of this condition include: A painful, firm bump under the skin. A bump with pus at the top. This may break through the skin and drain. Other symptoms include: Redness surrounding the abscess site. Warmth. Swelling of the lymph nodes (glands) near the abscess. Tenderness. A sore on the skin. How is this diagnosed? This condition may be diagnosed based on: A physical exam. Your medical history. A sample of pus. This may be used to find out what is causing the infection. Blood tests. Imaging tests, such as an ultrasound, CT scan, or MRI. How is this treated? A small abscess that drains on its own may not need treatment. Treatment for larger abscesses  may include: Moist heat or heat pack applied to the area several times a day. A procedure to drain the abscess (incision and drainage). Antibiotic medicines. For a severe abscess, you may first get antibiotics through an IV and then change to antibiotics by mouth. Follow these instructions at home: Medicines  Take over-the-counter and prescription medicines only as told by your health care provider. If you were prescribed an antibiotic medicine, take it as told by your health care provider. Do not stop taking the antibiotic even if you start to feel better. Abscess care  If you have an abscess that has not drained, apply heat to the affected area. Use the heat source that your health care provider recommends, such as a moist heat pack or a heating pad. Place a towel between your skin and the heat source. Leave the heat on for 20-30 minutes. Remove the heat if your skin turns bright red. This is especially important if you are unable to feel pain, heat, or cold. You may have a greater risk of getting burned. Follow instructions from your health care provider about how to take care of your abscess. Make sure you: Cover the abscess with a bandage (dressing). Change your dressing or gauze as told by your health care provider. Wash your hands with soap and water before you change the dressing or gauze. If soap and water are not available, use hand sanitizer. Check your abscess every day for signs of a worsening infection. Check for: More redness, swelling, or pain. More fluid or blood. Warmth.   More pus or a bad smell. General instructions To avoid spreading the infection: Do not share personal care items, towels, or hot tubs with others. Avoid making skin contact with other people. Keep all follow-up visits as told by your health care provider. This is important. Contact a health care provider if you have: More redness, swelling, or pain around your abscess. More fluid or blood coming from  your abscess. Warm skin around your abscess. More pus or a bad smell coming from your abscess. Muscle aches. Chills or a general ill feeling. Get help right away if you: Have severe pain. See red streaks on your skin spreading away from the abscess. See redness that spreads quickly. Have a fever or chills. Summary A skin abscess is an infected area on or under your skin that contains a collection of pus and other material. A small abscess that drains on its own may not need treatment. Treatment for larger abscesses may include having a procedure to drain the abscess and taking an antibiotic. This information is not intended to replace advice given to you by your health care provider. Make sure you discuss any questions you have with your health care provider. Document Revised: 10/16/2021 Document Reviewed: 04/21/2021 Elsevier Patient Education  2023 [employer]  

## 2022-05-08 NOTE — Assessment & Plan Note (Signed)
Rocephin 1 gm IM Doxy per orders Warm soaks with epson salt If redness worsens --- go to ER  If is it not improving contact office Monday and we will refer to gyn/ surgeon

## 2022-05-14 DIAGNOSIS — H40002 Preglaucoma, unspecified, left eye: Secondary | ICD-10-CM | POA: Diagnosis not present

## 2022-05-14 DIAGNOSIS — H401111 Primary open-angle glaucoma, right eye, mild stage: Secondary | ICD-10-CM | POA: Diagnosis not present

## 2022-05-21 ENCOUNTER — Other Ambulatory Visit: Payer: Self-pay | Admitting: Internal Medicine

## 2022-05-22 NOTE — Telephone Encounter (Signed)
Prescription refill request for Eliquis received.  Indication: afib  Last office visit: 10/13/2021, Branch Scr: 0.91, 01/13/2022 Age: 71 yo  Weight: 80.5 kg   Refill sent.

## 2022-06-02 DIAGNOSIS — E119 Type 2 diabetes mellitus without complications: Secondary | ICD-10-CM | POA: Diagnosis not present

## 2022-06-02 DIAGNOSIS — I10 Essential (primary) hypertension: Secondary | ICD-10-CM | POA: Diagnosis not present

## 2022-06-02 DIAGNOSIS — I480 Paroxysmal atrial fibrillation: Secondary | ICD-10-CM | POA: Diagnosis not present

## 2022-06-10 ENCOUNTER — Ambulatory Visit (INDEPENDENT_AMBULATORY_CARE_PROVIDER_SITE_OTHER): Payer: Medicare Other

## 2022-06-10 VITALS — Wt 177.0 lb

## 2022-06-10 DIAGNOSIS — Z0000 Encounter for general adult medical examination without abnormal findings: Secondary | ICD-10-CM | POA: Diagnosis not present
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
## 2022-06-10 NOTE — Patient Instructions (Signed)
[name] , Thank you for taking time to come for your Medicare Wellness Visit. I appreciate your ongoing commitment to your health goals. Please review the following plan we discussed and let me know if I can assist you in the future.   These are the goals we discussed:  Goals      LDL CALC < 70     Patient Stated     Stay healthy active         This is a list of the screening recommended for you and due dates:  Health Maintenance  Topic Date Due   Zoster (Shingles) Vaccine (1 of 2) Never done   Tetanus Vaccine  12/12/2017   Eye exam for diabetics  11/25/2018   Mammogram  01/24/2021   COVID-19 Vaccine (5 - Pfizer risk series) 02/27/2021   Colon Cancer Screening  08/06/2021   Flu Shot  Never done   Pneumonia Vaccine (1 - PCV) 08/21/2022*   Hemoglobin A1C  07/15/2022   Complete foot exam   08/21/2022   Yearly kidney function blood test for diabetes  01/14/2023   Yearly kidney health urinalysis for diabetes  01/14/2023   Medicare Annual Wellness Visit  06/11/2023   DEXA scan (bone density measurement)  Completed   Hepatitis C Screening: USPSTF Recommendation to screen - Ages 70-70 yo.  Completed   HPV Vaccine  Aged Out  *Topic was postponed. The date shown is not the original due date.    Advanced directives: Please bring a copy of your health care power of attorney and living will to the office at your convenience.  Conditions/risks identified: stay heathy and active   Next appointment: Follow up in one year for your annual wellness visit    Preventive Care 70 Years and Older, Female Preventive care refers to lifestyle choices and visits with your health care provider that can promote health and wellness. What does preventive care include? A yearly physical exam. This is also called an annual well check. Dental exams once or twice a year. Routine eye exams. Ask your health care provider how often you should have your eyes checked. Personal lifestyle choices,  including: Daily care of your teeth and gums. Regular physical activity. Eating a healthy diet. Avoiding tobacco and drug use. Limiting alcohol use. Practicing safe sex. Taking low-dose aspirin every day. Taking vitamin and mineral supplements as recommended by your health care provider. What happens during an annual well check? The services and screenings done by your health care provider during your annual well check will depend on your age, overall health, lifestyle risk factors, and family history of disease. Counseling  Your health care provider may ask you questions about your: Alcohol use. Tobacco use. Drug use. Emotional well-being. Home and relationship well-being. Sexual activity. Eating habits. History of falls. Memory and ability to understand (cognition). Work and work Statistician. Reproductive health. Screening  You may have the following tests or measurements: Height, weight, and BMI. Blood pressure. Lipid and cholesterol levels. These may be checked every 5 years, or more frequently if you are over 29 years old. Skin check. Lung cancer screening. You may have this screening every year starting at age 70 if you have a 30-pack-year history of smoking and currently smoke or have quit within the past 15 years. Fecal occult blood test (FOBT) of the stool. You may have this test every year starting at age 70. Flexible sigmoidoscopy or colonoscopy. You may have a sigmoidoscopy every 5 years or a colonoscopy every  10 years starting at age 70. Hepatitis C blood test. Hepatitis B blood test. Sexually transmitted disease (STD) testing. Diabetes screening. This is done by checking your blood sugar (glucose) after you have not eaten for a while (fasting). You may have this done every 1-3 years. Bone density scan. This is done to screen for osteoporosis. You may have this done starting at age 70. Mammogram. This may be done every 1-2 years. Talk to your health care provider  about how often you should have regular mammograms. Talk with your health care provider about your test results, treatment options, and if necessary, the need for more tests. Vaccines  Your health care provider may recommend certain vaccines, such as: Influenza vaccine. This is recommended every year. Tetanus, diphtheria, and acellular pertussis (Tdap, Td) vaccine. You may need a Td booster every 10 years. Zoster vaccine. You may need this after age 70. Pneumococcal 13-valent conjugate (PCV13) vaccine. One dose is recommended after age 70. Pneumococcal polysaccharide (PPSV23) vaccine. One dose is recommended after age 70. Talk to your health care provider about which screenings and vaccines you need and how often you need them. This information is not intended to replace advice given to you by your health care provider. Make sure you discuss any questions you have with your health care provider. Document Released: 08/09/2015 Document Revised: 04/01/2016 Document Reviewed: 05/14/2015 Elsevier Interactive Patient Education  2017 [place] Prevention in the Home Falls can cause injuries. They can happen to people of all ages. There are many things you can do to make your home safe and to help prevent falls. What can I do on the outside of my home? Regularly fix the edges of walkways and driveways and fix any cracks. Remove anything that might make you trip as you walk through a door, such as a raised step or threshold. Trim any bushes or trees on the path to your home. Use bright outdoor lighting. Clear any walking paths of anything that might make someone trip, such as rocks or tools. Regularly check to see if handrails are loose or broken. Make sure that both sides of any steps have handrails. Any raised decks and porches should have guardrails on the edges. Have any leaves, snow, or ice cleared regularly. Use sand or salt on walking paths during winter. Clean up any spills in  your garage right away. This includes oil or grease spills. What can I do in the bathroom? Use night lights. Install grab bars by the toilet and in the tub and shower. Do not use towel bars as grab bars. Use non-skid mats or decals in the tub or shower. If you need to sit down in the shower, use a plastic, non-slip stool. Keep the floor dry. Clean up any water that spills on the floor as soon as it happens. Remove soap buildup in the tub or shower regularly. Attach bath mats securely with double-sided non-slip rug tape. Do not have throw rugs and other things on the floor that can make you trip. What can I do in the bedroom? Use night lights. Make sure that you have a light by your bed that is easy to reach. Do not use any sheets or blankets that are too big for your bed. They should not hang down onto the floor. Have a firm chair that has side arms. You can use this for support while you get dressed. Do not have throw rugs and other things on the floor that can make you  trip. What can I do in the kitchen? Clean up any spills right away. Avoid walking on wet floors. Keep items that you use a lot in easy-to-reach places. If you need to reach something above you, use a strong step stool that has a grab bar. Keep electrical cords out of the way. Do not use floor polish or wax that makes floors slippery. If you must use wax, use non-skid floor wax. Do not have throw rugs and other things on the floor that can make you trip. What can I do with my stairs? Do not leave any items on the stairs. Make sure that there are handrails on both sides of the stairs and use them. Fix handrails that are broken or loose. Make sure that handrails are as long as the stairways. Check any carpeting to make sure that it is firmly attached to the stairs. Fix any carpet that is loose or worn. Avoid having throw rugs at the top or bottom of the stairs. If you do have throw rugs, attach them to the floor with carpet  tape. Make sure that you have a light switch at the top of the stairs and the bottom of the stairs. If you do not have them, ask someone to add them for you. What else can I do to help prevent falls? Wear shoes that: Do not have high heels. Have rubber bottoms. Are comfortable and fit you well. Are closed at the toe. Do not wear sandals. If you use a stepladder: Make sure that it is fully opened. Do not climb a closed stepladder. Make sure that both sides of the stepladder are locked into place. Ask someone to hold it for you, if possible. Clearly mark and make sure that you can see: Any grab bars or handrails. First and last steps. Where the edge of each step is. Use tools that help you move around (mobility aids) if they are needed. These include: Canes. Walkers. Scooters. Crutches. Turn on the lights when you go into a dark area. Replace any light bulbs as soon as they burn out. Set up your furniture so you have a clear path. Avoid moving your furniture around. If any of your floors are uneven, fix them. If there are any pets around you, be aware of where they are. Review your medicines with your doctor. Some medicines can make you feel dizzy. This can increase your chance of falling. Ask your doctor what other things that you can do to help prevent falls. This information is not intended to replace advice given to you by your health care provider. Make sure you discuss any questions you have with your health care provider. Document Released: 05/09/2009 Document Revised: 12/19/2015 Document Reviewed: 08/17/2014 Elsevier Interactive Patient Education  2017 [employer].

## 2022-06-24 ENCOUNTER — Other Ambulatory Visit: Payer: Self-pay | Admitting: Family Medicine

## 2022-06-24 DIAGNOSIS — I10 Essential (primary) hypertension: Secondary | ICD-10-CM

## 2022-06-30 ENCOUNTER — Ambulatory Visit (INDEPENDENT_AMBULATORY_CARE_PROVIDER_SITE_OTHER): Payer: Medicare Other | Admitting: Family Medicine

## 2022-06-30 VITALS — BP 108/78 | HR 80 | Temp 98.3°F | Resp 18 | Ht 65.0 in | Wt 175.0 lb

## 2022-06-30 DIAGNOSIS — J0140 Acute pansinusitis, unspecified: Secondary | ICD-10-CM | POA: Diagnosis not present

## 2022-06-30 DIAGNOSIS — J40 Bronchitis, not specified as acute or chronic: Secondary | ICD-10-CM | POA: Diagnosis not present

## 2022-06-30 DIAGNOSIS — H409 Unspecified glaucoma: Secondary | ICD-10-CM | POA: Diagnosis not present

## 2022-06-30 MED ORDER — CEFDINIR 300 MG PO CAPS: 300.0000 mg | ORAL_CAPSULE | Freq: Two times a day (BID) | ORAL | 0 refills | Status: DC

## 2022-06-30 MED ORDER — PROMETHAZINE-DM 6.25-15 MG/5ML PO SYRP: 5.0000 mL | ORAL_SOLUTION | Freq: Four times a day (QID) | ORAL | 0 refills | Status: DC | PRN

## 2022-06-30 MED ORDER — FLUTICASONE PROPIONATE 50 MCG/ACT NA SUSP: 2.0000 | Freq: Every day | NASAL | 6 refills | Status: DC
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
## 2022-06-30 NOTE — Assessment & Plan Note (Signed)
Abx per orders  Flonase and antihistamine

## 2022-07-28 ENCOUNTER — Other Ambulatory Visit: Payer: Self-pay | Admitting: Family Medicine

## 2022-07-28 DIAGNOSIS — Z0000 Encounter for general adult medical examination without abnormal findings: Secondary | ICD-10-CM

## 2022-07-28 DIAGNOSIS — I10 Essential (primary) hypertension: Secondary | ICD-10-CM

## 2022-08-25 ENCOUNTER — Ambulatory Visit (INDEPENDENT_AMBULATORY_CARE_PROVIDER_SITE_OTHER): Payer: Medicare Other | Admitting: Family Medicine

## 2022-08-25 ENCOUNTER — Ambulatory Visit (HOSPITAL_BASED_OUTPATIENT_CLINIC_OR_DEPARTMENT_OTHER)
Admission: RE | Admit: 2022-08-25 | Discharge: 2022-08-25 | Disposition: A | Discharge status: Home / Self Care | Payer: Medicare Other | Source: Ambulatory Visit | Attending: Family Medicine | Admitting: Family Medicine

## 2022-08-25 ENCOUNTER — Encounter: Payer: Self-pay | Admitting: Family Medicine

## 2022-08-25 VITALS — BP 112/70 | HR 68 | Temp 98.0°F | Resp 18 | Ht 65.0 in | Wt 178.4 lb

## 2022-08-25 DIAGNOSIS — J0140 Acute pansinusitis, unspecified: Secondary | ICD-10-CM | POA: Diagnosis not present

## 2022-08-25 DIAGNOSIS — R051 Acute cough: Secondary | ICD-10-CM | POA: Diagnosis not present

## 2022-08-25 DIAGNOSIS — R059 Cough, unspecified: Secondary | ICD-10-CM | POA: Diagnosis not present

## 2022-08-25 MED ORDER — PROMETHAZINE-DM 6.25-15 MG/5ML PO SYRP: 5.0000 mL | ORAL_SOLUTION | Freq: Four times a day (QID) | ORAL | 0 refills | Status: DC | PRN

## 2022-08-25 MED ORDER — AZITHROMYCIN 250 MG PO TABS: ORAL_TABLET | ORAL | 0 refills | Status: DC

## 2022-08-25 MED ORDER — PREDNISONE 20 MG PO TABS: 40.0000 mg | ORAL_TABLET | Freq: Every day | ORAL | 0 refills | Status: DC

## 2022-08-25 NOTE — Assessment & Plan Note (Signed)
Since 06/2022 Chest xray  Z pak  Pred taper  Refill cough meds
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
## 2022-09-12 ENCOUNTER — Inpatient Hospital Stay (HOSPITAL_BASED_OUTPATIENT_CLINIC_OR_DEPARTMENT_OTHER)
Admission: EM | Admit: 2022-09-12 | Discharge: 2022-09-20 | DRG: 336 | Disposition: A | Discharge status: Home / Self Care | Payer: Medicare Other | Attending: Internal Medicine | Admitting: Internal Medicine

## 2022-09-12 ENCOUNTER — Observation Stay (HOSPITAL_COMMUNITY): Payer: Medicare Other

## 2022-09-12 ENCOUNTER — Encounter (HOSPITAL_BASED_OUTPATIENT_CLINIC_OR_DEPARTMENT_OTHER): Payer: Self-pay

## 2022-09-12 ENCOUNTER — Emergency Department (HOSPITAL_BASED_OUTPATIENT_CLINIC_OR_DEPARTMENT_OTHER): Payer: Medicare Other

## 2022-09-12 ENCOUNTER — Other Ambulatory Visit: Payer: Self-pay

## 2022-09-12 DIAGNOSIS — K5651 Intestinal adhesions [bands], with partial obstruction: Principal | ICD-10-CM | POA: Diagnosis present

## 2022-09-12 DIAGNOSIS — E1165 Type 2 diabetes mellitus with hyperglycemia: Secondary | ICD-10-CM | POA: Diagnosis not present

## 2022-09-12 DIAGNOSIS — Z7901 Long term (current) use of anticoagulants: Secondary | ICD-10-CM | POA: Diagnosis not present

## 2022-09-12 DIAGNOSIS — E669 Obesity, unspecified: Secondary | ICD-10-CM | POA: Diagnosis not present

## 2022-09-12 DIAGNOSIS — K56609 Unspecified intestinal obstruction, unspecified as to partial versus complete obstruction: Secondary | ICD-10-CM | POA: Diagnosis present

## 2022-09-12 DIAGNOSIS — Z833 Family history of diabetes mellitus: Secondary | ICD-10-CM | POA: Diagnosis not present

## 2022-09-12 DIAGNOSIS — K6389 Other specified diseases of intestine: Secondary | ICD-10-CM | POA: Diagnosis not present

## 2022-09-12 DIAGNOSIS — R1110 Vomiting, unspecified: Secondary | ICD-10-CM | POA: Diagnosis not present

## 2022-09-12 DIAGNOSIS — F419 Anxiety disorder, unspecified: Secondary | ICD-10-CM | POA: Diagnosis not present

## 2022-09-12 DIAGNOSIS — Z800 Family history of malignant neoplasm of digestive organs: Secondary | ICD-10-CM | POA: Diagnosis not present

## 2022-09-12 DIAGNOSIS — Z882 Allergy status to sulfonamides status: Secondary | ICD-10-CM | POA: Diagnosis not present

## 2022-09-12 DIAGNOSIS — Z6829 Body mass index (BMI) 29.0-29.9, adult: Secondary | ICD-10-CM

## 2022-09-12 DIAGNOSIS — E876 Hypokalemia: Secondary | ICD-10-CM | POA: Diagnosis not present

## 2022-09-12 DIAGNOSIS — E871 Hypo-osmolality and hyponatremia: Secondary | ICD-10-CM | POA: Diagnosis present

## 2022-09-12 DIAGNOSIS — Z91011 Allergy to milk products: Secondary | ICD-10-CM

## 2022-09-12 DIAGNOSIS — Z888 Allergy status to other drugs, medicaments and biological substances status: Secondary | ICD-10-CM

## 2022-09-12 DIAGNOSIS — K5669 Other partial intestinal obstruction: Secondary | ICD-10-CM | POA: Diagnosis not present

## 2022-09-12 DIAGNOSIS — R109 Unspecified abdominal pain: Secondary | ICD-10-CM | POA: Diagnosis not present

## 2022-09-12 DIAGNOSIS — E119 Type 2 diabetes mellitus without complications: Secondary | ICD-10-CM | POA: Diagnosis not present

## 2022-09-12 DIAGNOSIS — R1013 Epigastric pain: Secondary | ICD-10-CM | POA: Diagnosis present

## 2022-09-12 DIAGNOSIS — R1084 Generalized abdominal pain: Secondary | ICD-10-CM | POA: Diagnosis not present

## 2022-09-12 DIAGNOSIS — I480 Paroxysmal atrial fibrillation: Secondary | ICD-10-CM

## 2022-09-12 DIAGNOSIS — Z4682 Encounter for fitting and adjustment of non-vascular catheter: Secondary | ICD-10-CM | POA: Diagnosis not present

## 2022-09-12 DIAGNOSIS — K565 Intestinal adhesions [bands], unspecified as to partial versus complete obstruction: Secondary | ICD-10-CM | POA: Diagnosis not present

## 2022-09-12 DIAGNOSIS — E785 Hyperlipidemia, unspecified: Secondary | ICD-10-CM | POA: Diagnosis not present

## 2022-09-12 DIAGNOSIS — I10 Essential (primary) hypertension: Secondary | ICD-10-CM | POA: Diagnosis present

## 2022-09-12 DIAGNOSIS — Z8042 Family history of malignant neoplasm of prostate: Secondary | ICD-10-CM | POA: Diagnosis not present

## 2022-09-12 DIAGNOSIS — I700 Atherosclerosis of aorta: Secondary | ICD-10-CM | POA: Diagnosis not present

## 2022-09-12 DIAGNOSIS — Z880 Allergy status to penicillin: Secondary | ICD-10-CM | POA: Diagnosis not present

## 2022-09-12 DIAGNOSIS — Z8249 Family history of ischemic heart disease and other diseases of the circulatory system: Secondary | ICD-10-CM | POA: Diagnosis not present

## 2022-09-12 DIAGNOSIS — E1169 Type 2 diabetes mellitus with other specified complication: Secondary | ICD-10-CM | POA: Diagnosis not present

## 2022-09-12 DIAGNOSIS — Z79899 Other long term (current) drug therapy: Secondary | ICD-10-CM

## 2022-09-12 DIAGNOSIS — R935 Abnormal findings on diagnostic imaging of other abdominal regions, including retroperitoneum: Secondary | ICD-10-CM | POA: Diagnosis not present

## 2022-09-12 LAB — CBC WITH DIFFERENTIAL/PLATELET
Abs Immature Granulocytes: 0.01 10*3/uL (ref 0.00–0.07)
Basophils Absolute: 0 10*3/uL (ref 0.0–0.1)
Basophils Relative: 0 %
Eosinophils Absolute: 0 10*3/uL (ref 0.0–0.5)
Eosinophils Relative: 1 %
HCT: 42.3 % (ref 36.0–46.0)
Hemoglobin: 14.5 g/dL (ref 12.0–15.0)
Immature Granulocytes: 0 %
Lymphocytes Relative: 22 %
Lymphs Abs: 1.4 10*3/uL (ref 0.7–4.0)
MCH: 30.1 pg (ref 26.0–34.0)
MCHC: 34.3 g/dL (ref 30.0–36.0)
MCV: 87.9 fL (ref 80.0–100.0)
Monocytes Absolute: 0.3 10*3/uL (ref 0.1–1.0)
Monocytes Relative: 5 %
Neutro Abs: 4.6 10*3/uL (ref 1.7–7.7)
Neutrophils Relative %: 72 %
Platelets: 302 10*3/uL (ref 150–400)
RBC: 4.81 MIL/uL (ref 3.87–5.11)
RDW: 13.1 % (ref 11.5–15.5)
WBC: 6.4 10*3/uL (ref 4.0–10.5)
nRBC: 0 % (ref 0.0–0.2)

## 2022-09-12 LAB — COMPREHENSIVE METABOLIC PANEL
ALT: 23 U/L (ref 0–44)
AST: 30 U/L (ref 15–41)
Albumin: 4.3 g/dL (ref 3.5–5.0)
Alkaline Phosphatase: 67 U/L (ref 38–126)
Anion gap: 8 (ref 5–15)
BUN: 12 mg/dL (ref 8–23)
CO2: 24 mmol/L (ref 22–32)
Calcium: 8.6 mg/dL — ABNORMAL LOW (ref 8.9–10.3)
Chloride: 97 mmol/L — ABNORMAL LOW (ref 98–111)
Creatinine, Ser: 0.64 mg/dL (ref 0.44–1.00)
GFR, Estimated: >60 mL/min (ref >60)
Glucose, Bld: 166 mg/dL — ABNORMAL HIGH (ref 70–99)
Potassium: 2.9 mmol/L — ABNORMAL LOW (ref 3.5–5.1)
Sodium: 129 mmol/L — ABNORMAL LOW (ref 135–145)
Total Bilirubin: 1.1 mg/dL (ref 0.3–1.2)
Total Protein: 7.9 g/dL (ref 6.5–8.1)

## 2022-09-12 LAB — GLUCOSE, CAPILLARY
Glucose-Capillary: 119 mg/dL — ABNORMAL HIGH (ref 70–99)
Glucose-Capillary: 128 mg/dL — ABNORMAL HIGH (ref 70–99)
Glucose-Capillary: 134 mg/dL — ABNORMAL HIGH (ref 70–99)
Glucose-Capillary: 157 mg/dL — ABNORMAL HIGH (ref 70–99)

## 2022-09-12 LAB — CBC
HCT: 41.4 % (ref 36.0–46.0)
Hemoglobin: 13.8 g/dL (ref 12.0–15.0)
MCH: 30.1 pg (ref 26.0–34.0)
MCHC: 33.3 g/dL (ref 30.0–36.0)
MCV: 90.2 fL (ref 80.0–100.0)
Platelets: 291 10*3/uL (ref 150–400)
RBC: 4.59 MIL/uL (ref 3.87–5.11)
RDW: 13.2 % (ref 11.5–15.5)
WBC: 4.6 10*3/uL (ref 4.0–10.5)
nRBC: 0 % (ref 0.0–0.2)

## 2022-09-12 LAB — CREATININE, SERUM
Creatinine, Ser: 0.62 mg/dL (ref 0.44–1.00)
GFR, Estimated: >60 mL/min (ref >60)

## 2022-09-12 LAB — HEMOGLOBIN A1C
Hgb A1c MFr Bld: 5.6 % (ref 4.8–5.6)
Mean Plasma Glucose: 114.02 mg/dL

## 2022-09-12 LAB — HIV ANTIBODY (ROUTINE TESTING W REFLEX): HIV Screen 4th Generation wRfx: NONREACTIVE

## 2022-09-12 LAB — LIPASE, BLOOD: Lipase: 31 U/L (ref 11–51)

## 2022-09-12 LAB — MAGNESIUM: Magnesium: 1.7 mg/dL (ref 1.7–2.4)

## 2022-09-12 MED ORDER — MORPHINE SULFATE (PF) 4 MG/ML IV SOLN
4.0000 mg | Freq: Once | INTRAVENOUS | Status: AC
  Administered 2022-09-12: 4 mg via INTRAVENOUS
  Filled 2022-09-12: qty 1

## 2022-09-12 MED ORDER — DIATRIZOATE MEGLUMINE & SODIUM 66-10 % PO SOLN
90.0000 mL | Freq: Once | ORAL | Status: AC
  Administered 2022-09-12: 90 mL via NASOGASTRIC
  Filled 2022-09-12: qty 90

## 2022-09-12 MED ORDER — SODIUM CHLORIDE 0.9 % IV SOLN: Freq: Once | INTRAVENOUS | Status: AC

## 2022-09-12 MED ORDER — ONDANSETRON HCL 4 MG/2ML IJ SOLN
4.0000 mg | Freq: Four times a day (QID) | INTRAMUSCULAR | Status: DC | PRN
  Administered 2022-09-13 – 2022-09-18 (×5): 4 mg via INTRAVENOUS
  Filled 2022-09-12 (×5): qty 2

## 2022-09-12 MED ORDER — HYDROMORPHONE HCL 1 MG/ML IJ SOLN
1.0000 mg | INTRAMUSCULAR | Status: DC | PRN
  Administered 2022-09-12 – 2022-09-17 (×13): 1 mg via INTRAVENOUS
  Filled 2022-09-12 (×13): qty 1

## 2022-09-12 MED ORDER — ALBUTEROL SULFATE (2.5 MG/3ML) 0.083% IN NEBU: 2.5000 mg | INHALATION_SOLUTION | RESPIRATORY_TRACT | Status: DC | PRN

## 2022-09-12 MED ORDER — ACETAMINOPHEN 325 MG PO TABS: 650.0000 mg | ORAL_TABLET | Freq: Four times a day (QID) | ORAL | Status: DC | PRN

## 2022-09-12 MED ORDER — ONDANSETRON HCL 4 MG/2ML IJ SOLN
4.0000 mg | Freq: Once | INTRAMUSCULAR | Status: AC
  Administered 2022-09-12: 4 mg via INTRAVENOUS
  Filled 2022-09-12: qty 2

## 2022-09-12 MED ORDER — INSULIN ASPART 100 UNIT/ML IJ SOLN
0.0000 [IU] | INTRAMUSCULAR | Status: DC
  Administered 2022-09-12: 2 [IU] via SUBCUTANEOUS
  Administered 2022-09-12: 3 [IU] via SUBCUTANEOUS
  Administered 2022-09-13 (×3): 2 [IU] via SUBCUTANEOUS
  Administered 2022-09-13: 3 [IU] via SUBCUTANEOUS
  Administered 2022-09-14: 2 [IU] via SUBCUTANEOUS
  Administered 2022-09-14: 3 [IU] via SUBCUTANEOUS
  Administered 2022-09-15 – 2022-09-17 (×2): 2 [IU] via SUBCUTANEOUS
  Administered 2022-09-18: 3 [IU] via SUBCUTANEOUS
  Administered 2022-09-19: 2 [IU] via SUBCUTANEOUS
  Administered 2022-09-19: 3 [IU] via SUBCUTANEOUS

## 2022-09-12 MED ORDER — HYDRALAZINE HCL 20 MG/ML IJ SOLN: 5.0000 mg | Freq: Four times a day (QID) | INTRAMUSCULAR | Status: DC | PRN

## 2022-09-12 MED ORDER — TIMOLOL MALEATE 0.5 % OP SOLN
1.0000 [drp] | Freq: Two times a day (BID) | OPHTHALMIC | Status: DC
  Administered 2022-09-12 – 2022-09-20 (×16): 1 [drp] via OPHTHALMIC
  Filled 2022-09-12: qty 5

## 2022-09-12 MED ORDER — POTASSIUM CHLORIDE 10 MEQ/100ML IV SOLN
10.0000 meq | INTRAVENOUS | Status: AC
  Administered 2022-09-12 (×4): 10 meq via INTRAVENOUS
  Filled 2022-09-12 (×3): qty 100

## 2022-09-12 MED ORDER — HYDROMORPHONE HCL 1 MG/ML IJ SOLN
0.5000 mg | Freq: Once | INTRAMUSCULAR | Status: AC
  Administered 2022-09-12: 0.5 mg via INTRAVENOUS
  Filled 2022-09-12: qty 1

## 2022-09-12 MED ORDER — PANTOPRAZOLE SODIUM 40 MG IV SOLR
40.0000 mg | INTRAVENOUS | Status: DC
  Administered 2022-09-12 – 2022-09-17 (×6): 40 mg via INTRAVENOUS
  Filled 2022-09-12 (×6): qty 10

## 2022-09-12 MED ORDER — ACETAMINOPHEN 650 MG RE SUPP: 650.0000 mg | Freq: Four times a day (QID) | RECTAL | Status: DC | PRN

## 2022-09-12 MED ORDER — SODIUM CHLORIDE 0.9 % IV BOLUS
1000.0000 mL | Freq: Once | INTRAVENOUS | Status: AC
  Administered 2022-09-12: 1000 mL via INTRAVENOUS

## 2022-09-12 MED ORDER — IOHEXOL 300 MG/ML  SOLN
100.0000 mL | Freq: Once | INTRAMUSCULAR | Status: AC | PRN
  Administered 2022-09-12: 100 mL via INTRAVENOUS

## 2022-09-12 MED ORDER — TRAZODONE HCL 50 MG PO TABS
25.0000 mg | ORAL_TABLET | Freq: Every evening | ORAL | Status: DC | PRN
  Administered 2022-09-13 – 2022-09-19 (×2): 25 mg via ORAL
  Filled 2022-09-12 (×2): qty 1

## 2022-09-12 MED ORDER — ONDANSETRON HCL 4 MG PO TABS: 4.0000 mg | ORAL_TABLET | Freq: Four times a day (QID) | ORAL | Status: DC | PRN

## 2022-09-12 MED ORDER — ENOXAPARIN SODIUM 40 MG/0.4ML IJ SOSY
40.0000 mg | PREFILLED_SYRINGE | INTRAMUSCULAR | Status: DC
  Administered 2022-09-12 – 2022-09-13 (×2): 40 mg via SUBCUTANEOUS
  Filled 2022-09-12 (×2): qty 0.4

## 2022-09-12 NOTE — Plan of Care (Signed)
[facility] will assume care on arrival to accepting facility. Until arrival, care as per EDP. However, [facility] available 24/7 for questions and assistance.  Nursing staff, please page [facility] Admits and Consults ([phone]) as soon as the patient arrives to the hospital.   
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
## 2022-09-12 NOTE — ED Notes (Signed)
Called [facility] for transport at 10:16.

## 2022-09-12 NOTE — ED Provider Notes (Signed)
  Physical Exam  BP 120/80 (BP Location: Right Arm)   Pulse 67   Temp 98.1 F (36.7 C) (Oral)   Resp (!) 22   Ht 5' 5"$  (1.651 m)   Wt 79.4 kg   SpO2 92%   BMI 29.12 kg/m   Physical Exam  Procedures  Procedures  ED Course / MDM     Received care of patient from Dr. Stark Jock.  Please see his note for prior history, physical and care.  Briefly is a 72 year old female who presented with abdominal pain and was found to have a small bowel obstruction.  She is not actively vomiting.  Likely adhesive disease.  Noted hypokalemia, given potassium replacement.  Currently awaiting a bed.   Gareth Morgan, MD 09/12/22 [phone]

## 2022-09-12 NOTE — Progress Notes (Signed)
MD placed order for diatrizoate meglumine-sodium (GASTROGRAFIN) 66-10 % solution 90 mL per NG tube. Pt does not have an NG tube. RN messaged to clarify and MD stated to give med orally.

## 2022-09-12 NOTE — ED Triage Notes (Signed)
Pt with acute onset abd pain for 2-3 hours. Endorses N/V around same time. Denies fevers at home and felt fine when she went to bed last night.
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
## 2022-09-12 NOTE — ED Notes (Signed)
Report given to [facility]. [facility] at bedside

## 2022-09-12 NOTE — ED Provider Notes (Signed)
[facility] Provider Note   CSN: [id number] Arrival date & time: 09/12/22  0152     History  Chief Complaint  Patient presents with   Abdominal Pain    Adriana Collins is a 72 y.o. female.  Patient is a 72 year old female with past medical history of paroxysmal atrial fibrillation on Eliquis, hypertension, hyperlipidemia, prior appendectomy.  Patient presents today with complaints of abdominal pain and vomiting.  This began approximately 4 hours prior to presentation.  She denies any fevers or chills.  She denies any diarrhea or constipation.  She denies any ill contacts or having consumed any undercooked or suspicious foods.  There are no aggravating or alleviating factors.  The history is provided by the patient.       Home Medications Prior to Admission medications   Medication Sig Start Date End Date Taking? Authorizing Provider  amLODipine (NORVASC) 5 MG tablet TAKE 1 TABLET DAILY (NEED OFFICE VISIT FOR ANY FURTHER REFILLS) 07/28/22   Carollee Herter, Alferd Apa, DO  apixaban (ELIQUIS) 5 MG TABS tablet TAKE 1 TABLET TWICE A DAY 05/22/22   Janina Mayo, MD  Ascorbic Acid (VITAMIN C) 100 MG tablet Take 1,000 mg by mouth daily. Take 1067m daily, then increases to 300107mdaily if feeling illness    [provider]  azithromycin (ZITHROMAX Z-PAK) 250 MG tablet As directed 08/25/22   LoCarollee HerterYvAlferd ApaDO  B Complex Vitamins (VITAMIN B COMPLEX PO) Take 1 capsule by mouth daily.    [provider]  Barberry-Oreg Grape-Goldenseal (BERBERINE COMPLEX PO) Take by mouth.    [provider]  Blood Glucose Monitoring Suppl (FREESTYLE FREEDOM LITE) w/Device KIT Use as directed once a day.  DX code: E11.9 04/01/18   LoCarollee HerterYvAlferd ApaDO  CALCIUM/MAGNESIUM/ZINC FORMULA PO Take 3,000 mg by mouth daily.    [provider]  cefdinir (OMNICEF) 300 MG capsule Take 1 capsule (300 mg total) by mouth 2 (two)  times daily. 06/30/22   LoRoma Schanz, DO  CINNAMON PO Take by mouth.    [provider]  CITRUS BERGAMOT PO Take 2 capsules by mouth 2 (two) times daily. With meals    [provider]  Coenzyme Q10 (CO Q 10 PO) Take by mouth.    [provider]  desonide (DESOWEN) 0.05 % cream Apply 1 application topically 2 (two) times daily. 04/26/21   [provider]  fluticasone (FLONASE) 50 MCG/ACT nasal spray Place 2 sprays into both nostrils daily. 06/30/22   LoRoma Schanz, DO  glucose blood (FREESTYLE LITE) test strip USE AS INSTRUCTED 11/18/21   LoRoma Schanz, DO  glucose blood test strip Use as instructed--redi code plus ,advicate 03/15/18   LoCarollee HerterYvAlferd ApaDO  GRAPE SEED CR PO Take by mouth.    [provider]  hydrochlorothiazide (HYDRODIURIL) 25 MG tablet TAKE ONE-HALF (1/2) TABLET DAILY 06/24/22   LoCarollee HerterYvAlferd ApaDO  loratadine (CLARITIN) 10 MG tablet Take by mouth daily as needed.    [provider]  metoprolol succinate (TOPROL-XL) 25 MG 24 hr tablet Take 0.5 tablets (12.5 mg total) by mouth daily. 11/18/21   BrJanina MayoMD  MULTIPLE VITAMIN PO Take 1 tablet by mouth daily.    [provider]  mupirocin ointment (BACTROBAN) 2 % Apply 1 application. topically 2 (two) times daily. 12/11/21   LoAnn HeldDO  omeprazole (PRILOSEC) 20  MG capsule TAKE 1 CAPSULE(20 MG) BY MOUTH DAILY 02/10/22   Carollee Herter, Alferd Apa, DO  OVER THE COUNTER MEDICATION Turmeric Root Extract-Take 1 capsule by mouth daily.    [provider]  predniSONE (DELTASONE) 20 MG tablet Take 2 tablets (40 mg total) by mouth daily. 08/25/22   [name], DO  promethazine-dextromethorphan (PROMETHAZINE-DM) 6.25-15 MG/5ML syrup Take 5 mLs by mouth 4 (four) times daily as needed. 08/25/22   [name], DO  timolol (BETIMOL) 0.5 % ophthalmic solution Place 1 drop into the right eye 2 (two) times daily.  06/30/22   [name], DO      Allergies    Adhesive [tape], Amoxil [amoxicillin], Azor [amlodipine-olmesartan], Bystolic [nebivolol hcl], Crestor [rosuvastatin], Diltiazem, Ezetimibe, Lipitor [atorvastatin], Lisinopril, Milk-related compounds, Prednisone, Sulfonamide derivatives, and Diovan [valsartan]    Review of Systems   Review of Systems  All other systems reviewed and are negative.   Physical Exam Updated Vital Signs BP (!) 144/96 (BP Location: Right Arm)   Pulse 73   Temp 97.9 F (36.6 C) (Oral)   Resp 20   Ht 5' 5"$  (1.651 m)   Wt 79.4 kg   SpO2 99%   BMI 29.12 kg/m  Physical Exam Vitals and nursing note reviewed.  Constitutional:      General: She is not in acute distress.    Appearance: She is well-developed. She is not diaphoretic.  HENT:     Head: Normocephalic and atraumatic.  Cardiovascular:     Rate and Rhythm: Normal rate and regular rhythm.     Heart sounds: No murmur heard.    No friction rub. No gallop.  Pulmonary:     Effort: Pulmonary effort is normal. No respiratory distress.     Breath sounds: Normal breath sounds. No wheezing.  Abdominal:     General: Bowel sounds are normal. There is no distension.     Palpations: Abdomen is soft.     Tenderness: There is generalized abdominal tenderness. There is no right CVA tenderness, left CVA tenderness, guarding or rebound.     Comments: There is mild, generalized abdominal tenderness, but no rebound or guarding.  Musculoskeletal:        General: Normal range of motion.     Cervical back: Normal range of motion and neck supple.  Skin:    General: Skin is warm and dry.  Neurological:     General: No focal deficit present.     Mental Status: She is alert and oriented to person, place, and time.     ED Results / Procedures / Treatments   Labs (all labs ordered are listed, but only abnormal results are displayed) Labs Reviewed  LIPASE, BLOOD  CBC WITH DIFFERENTIAL/PLATELET  COMPREHENSIVE  METABOLIC PANEL    EKG EKG Interpretation  Date/Time:  Saturday September 12 2022 02:18:49 EST Ventricular Rate:  105 PR Interval:  195 QRS Duration: 95 QT Interval:  368 QTC Calculation: 494 R Axis:   -37 Text Interpretation: Atrial Fibrillation Left axis deviation Borderline prolonged QT interval Confirmed by Veryl Speak [phone]) on 09/12/2022 2:28:59 AM  Radiology No results found.  Procedures Procedures    Medications Ordered in ED Medications  sodium chloride 0.9 % bolus 1,000 mL (has no administration in time range)  ondansetron (ZOFRAN) injection 4 mg (has no administration in time range)  morphine (PF) 4 MG/ML injection 4 mg (has no administration in time range)    ED Course/ Medical Decision Making/ A&P  Patient is a 72 year old female with history of paroxysmal A-fib on Eliquis and prior appendectomy.  She presents today for evaluation of abdominal pain, nausea, and vomiting that started acutely in the night.  Patient arrives here with stable vital signs and is afebrile.  She does appear uncomfortable upon presentation and abdomen reveals generalized tenderness with no peritoneal signs.  Workup initiated including CBC, CMP, and lipase.  These were all essentially unremarkable.  CT scan of the abdomen and pelvis obtained showing a partial or early bowel obstruction with transition point in the right lower quadrant in the area of the prior appendectomy.  Patient has received 2 doses of morphine for pain and 2 doses of Zofran for nausea and is feeling better, but remains uncomfortable.    I feel as though patient will require admission and have spoken with the hospitalist who agrees to admit.  Final Clinical Impression(s) / ED Diagnoses Final diagnoses:  None    Rx / DC Orders ED Discharge Orders     None         Veryl Speak, MD 09/12/22 [phone]

## 2022-09-12 NOTE — Progress Notes (Signed)
  Transition of Care [facility]) Screening Note   Patient Details  Name: Adriana Collins Date of Birth: 1951/06/09   Transition of Care [facility]) CM/SW Contact:    Henrietta Dine, RN Phone Number: 09/12/2022, 12:28 PM    Transition of Care Department [facility]) has reviewed patient and no TOC needs have been identified at this time. We will continue to monitor patient advancement through interdisciplinary progression rounds. If new patient transition needs arise, please place a TOC consult.

## 2022-09-13 ENCOUNTER — Observation Stay (HOSPITAL_COMMUNITY): Payer: Medicare Other

## 2022-09-13 DIAGNOSIS — E669 Obesity, unspecified: Secondary | ICD-10-CM | POA: Diagnosis present

## 2022-09-13 DIAGNOSIS — R1013 Epigastric pain: Secondary | ICD-10-CM | POA: Diagnosis present

## 2022-09-13 DIAGNOSIS — Z8249 Family history of ischemic heart disease and other diseases of the circulatory system: Secondary | ICD-10-CM | POA: Diagnosis not present

## 2022-09-13 DIAGNOSIS — I480 Paroxysmal atrial fibrillation: Secondary | ICD-10-CM | POA: Diagnosis present

## 2022-09-13 DIAGNOSIS — Z7901 Long term (current) use of anticoagulants: Secondary | ICD-10-CM | POA: Diagnosis not present

## 2022-09-13 DIAGNOSIS — Z4682 Encounter for fitting and adjustment of non-vascular catheter: Secondary | ICD-10-CM | POA: Diagnosis not present

## 2022-09-13 DIAGNOSIS — Z79899 Other long term (current) drug therapy: Secondary | ICD-10-CM | POA: Diagnosis not present

## 2022-09-13 DIAGNOSIS — E1165 Type 2 diabetes mellitus with hyperglycemia: Secondary | ICD-10-CM | POA: Diagnosis present

## 2022-09-13 DIAGNOSIS — Z800 Family history of malignant neoplasm of digestive organs: Secondary | ICD-10-CM | POA: Diagnosis not present

## 2022-09-13 DIAGNOSIS — K5651 Intestinal adhesions [bands], with partial obstruction: Secondary | ICD-10-CM | POA: Diagnosis present

## 2022-09-13 DIAGNOSIS — K5669 Other partial intestinal obstruction: Secondary | ICD-10-CM | POA: Diagnosis not present

## 2022-09-13 DIAGNOSIS — K56609 Unspecified intestinal obstruction, unspecified as to partial versus complete obstruction: Secondary | ICD-10-CM | POA: Diagnosis not present

## 2022-09-13 DIAGNOSIS — E785 Hyperlipidemia, unspecified: Secondary | ICD-10-CM | POA: Diagnosis present

## 2022-09-13 DIAGNOSIS — F419 Anxiety disorder, unspecified: Secondary | ICD-10-CM | POA: Diagnosis not present

## 2022-09-13 DIAGNOSIS — R935 Abnormal findings on diagnostic imaging of other abdominal regions, including retroperitoneum: Secondary | ICD-10-CM | POA: Diagnosis not present

## 2022-09-13 DIAGNOSIS — E876 Hypokalemia: Secondary | ICD-10-CM | POA: Diagnosis present

## 2022-09-13 DIAGNOSIS — K565 Intestinal adhesions [bands], unspecified as to partial versus complete obstruction: Secondary | ICD-10-CM | POA: Diagnosis not present

## 2022-09-13 DIAGNOSIS — Z880 Allergy status to penicillin: Secondary | ICD-10-CM | POA: Diagnosis not present

## 2022-09-13 DIAGNOSIS — Z91011 Allergy to milk products: Secondary | ICD-10-CM | POA: Diagnosis not present

## 2022-09-13 DIAGNOSIS — E871 Hypo-osmolality and hyponatremia: Secondary | ICD-10-CM | POA: Diagnosis present

## 2022-09-13 DIAGNOSIS — Z882 Allergy status to sulfonamides status: Secondary | ICD-10-CM | POA: Diagnosis not present

## 2022-09-13 DIAGNOSIS — E1169 Type 2 diabetes mellitus with other specified complication: Secondary | ICD-10-CM | POA: Diagnosis present

## 2022-09-13 DIAGNOSIS — R1084 Generalized abdominal pain: Secondary | ICD-10-CM | POA: Diagnosis not present

## 2022-09-13 DIAGNOSIS — Z833 Family history of diabetes mellitus: Secondary | ICD-10-CM | POA: Diagnosis not present

## 2022-09-13 DIAGNOSIS — I10 Essential (primary) hypertension: Secondary | ICD-10-CM | POA: Diagnosis present

## 2022-09-13 DIAGNOSIS — Z888 Allergy status to other drugs, medicaments and biological substances status: Secondary | ICD-10-CM | POA: Diagnosis not present

## 2022-09-13 DIAGNOSIS — E119 Type 2 diabetes mellitus without complications: Secondary | ICD-10-CM | POA: Diagnosis not present

## 2022-09-13 DIAGNOSIS — Z8042 Family history of malignant neoplasm of prostate: Secondary | ICD-10-CM | POA: Diagnosis not present

## 2022-09-13 DIAGNOSIS — K6389 Other specified diseases of intestine: Secondary | ICD-10-CM | POA: Diagnosis not present

## 2022-09-13 DIAGNOSIS — Z6829 Body mass index (BMI) 29.0-29.9, adult: Secondary | ICD-10-CM | POA: Diagnosis not present

## 2022-09-13 LAB — CBC
HCT: 45.3 % (ref 36.0–46.0)
Hemoglobin: 14.9 g/dL (ref 12.0–15.0)
MCH: 30.5 pg (ref 26.0–34.0)
MCHC: 32.9 g/dL (ref 30.0–36.0)
MCV: 92.6 fL (ref 80.0–100.0)
Platelets: 318 10*3/uL (ref 150–400)
RBC: 4.89 MIL/uL (ref 3.87–5.11)
RDW: 13.6 % (ref 11.5–15.5)
WBC: 7.5 10*3/uL (ref 4.0–10.5)
nRBC: 0 % (ref 0.0–0.2)

## 2022-09-13 LAB — COMPREHENSIVE METABOLIC PANEL
ALT: 21 U/L (ref 0–44)
AST: 27 U/L (ref 15–41)
Albumin: 4.2 g/dL (ref 3.5–5.0)
Alkaline Phosphatase: 60 U/L (ref 38–126)
Anion gap: 13 (ref 5–15)
BUN: 18 mg/dL (ref 8–23)
CO2: 22 mmol/L (ref 22–32)
Calcium: 9.8 mg/dL (ref 8.9–10.3)
Chloride: 99 mmol/L (ref 98–111)
Creatinine, Ser: 1.07 mg/dL — ABNORMAL HIGH (ref 0.44–1.00)
GFR, Estimated: 56 mL/min — ABNORMAL LOW (ref >60)
Glucose, Bld: 150 mg/dL — ABNORMAL HIGH (ref 70–99)
Potassium: 3.5 mmol/L (ref 3.5–5.1)
Sodium: 134 mmol/L — ABNORMAL LOW (ref 135–145)
Total Bilirubin: 1.7 mg/dL — ABNORMAL HIGH (ref 0.3–1.2)
Total Protein: 7.3 g/dL (ref 6.5–8.1)

## 2022-09-13 LAB — GLUCOSE, CAPILLARY
Glucose-Capillary: 124 mg/dL — ABNORMAL HIGH (ref 70–99)
Glucose-Capillary: 121 mg/dL — ABNORMAL HIGH (ref 70–99)
Glucose-Capillary: 165 mg/dL — ABNORMAL HIGH (ref 70–99)
Glucose-Capillary: 144 mg/dL — ABNORMAL HIGH (ref 70–99)
Glucose-Capillary: 128 mg/dL — ABNORMAL HIGH (ref 70–99)

## 2022-09-13 MED ORDER — METOPROLOL TARTRATE 5 MG/5ML IV SOLN
5.0000 mg | Freq: Three times a day (TID) | INTRAVENOUS | Status: DC
  Administered 2022-09-13 – 2022-09-18 (×13): 5 mg via INTRAVENOUS
  Filled 2022-09-13 (×13): qty 5

## 2022-09-13 MED ORDER — METOPROLOL TARTRATE 5 MG/5ML IV SOLN: 5.0000 mg | Freq: Three times a day (TID) | INTRAVENOUS | Status: DC | PRN

## 2022-09-13 MED ORDER — LACTATED RINGERS IV SOLN: INTRAVENOUS | Status: AC

## 2022-09-13 MED ORDER — POTASSIUM CHLORIDE 10 MEQ/100ML IV SOLN
10.0000 meq | INTRAVENOUS | Status: AC
  Administered 2022-09-13 (×4): 10 meq via INTRAVENOUS
  Filled 2022-09-13 (×4): qty 100

## 2022-09-13 MED ORDER — METOPROLOL TARTRATE 5 MG/5ML IV SOLN
1.0000 mg | Freq: Once | INTRAVENOUS | Status: AC | PRN
  Administered 2022-09-13: 1 mg via INTRAVENOUS
  Filled 2022-09-13: qty 5

## 2022-09-13 NOTE — Progress Notes (Signed)
PROGRESS NOTE    Adriana Collins  [id number] DOB: [birth year] DOA: 09/12/2022 PCP: [name], DO     Brief Narrative:  H/o HTN, HLD, NIDDM 2 ,PAF on eliquis, presented with acute onset abdomen pain nausea vomiting began approximately 4 hours prior to presentation, reported prior history of appendectomy, Last normal BM was yesterday AM, doesn't think she has passed any gas on day of admission   CT scan of the abdomen and pelvis obtained showing a partial or early bowel obstruction with transition point in the right lower quadrant in the area of the prior appendectomy.   Subjective:  got Gastrografin orally on 2/18 at 4pm, She is n.p.o., Then Vomited last night , received IV Dilaudid around 5 AM this morning, she is started on ng suction this morning   Currently she denies pain, reports no flatus     Assessment & Plan:  Principal Problem:   SBO (small bowel obstruction) (HCC) Active Problems:   Hyperlipidemia with target LDL less than 70   Obesity (BMI 30)   Hyperlipidemia associated with type 2 diabetes mellitus ([place])   Uncontrolled type 2 diabetes mellitus with hyperglycemia (HCC)   Paroxysmal atrial fibrillation (HCC)   Chronic anticoagulation    Assessment and Plan:    SBO -N.p.o., s/p  Gastrografin orally on 2/17, then vomited overnight, ng placed on 2/18 am per gen surg recommendation  -Start hydration , continue hold Eliquis -Diet advancement per general surgery  Hypokalemia Replaced  PAF Holding Eliquis ( last took on 2/16 pm) Change metoprolol with holding parameters to IV due to n.p.o.  DM 2, appears diet controlled A1c 5.6 Currently n.p.o. on IV hydration   Body mass index is 29.12 kg/m..      I have Reviewed nursing notes, Vitals, pain scores, I/o's, Lab results and  imaging results since pt's last encounter, details please see discussion above  I ordered the following labs:  Unresulted Labs (From admission, onward)      Start     Ordered   09/19/22 0500  Creatinine, serum  (enoxaparin (LOVENOX)    CrCl >/= 30 ml/min)  Weekly,   R     Comments: while on enoxaparin therapy    09/12/22 1301   09/14/22 0500  CBC with Differential/Platelet  Tomorrow morning,   R       Question:  Specimen collection method  Answer:  Lab=Lab collect   09/13/22 0905   09/14/22 [id number]  Basic metabolic panel  Tomorrow morning,   R       Question:  Specimen collection method  Answer:  Lab=Lab collect   09/13/22 0905   09/14/22 0500  Magnesium  Tomorrow morning,   R       Question:  Specimen collection method  Answer:  Lab=Lab collect   09/13/22 0905   09/14/22 0500  Phosphorus  Tomorrow morning,   R       Question:  Specimen collection method  Answer:  Lab=Lab collect   09/13/22 0905             DVT prophylaxis: enoxaparin (LOVENOX) injection 40 mg Start: 09/12/22 1800 SCDs Start: 09/12/22 1300   Code Status:   Code Status: Full Code  Family Communication: Husband at bedside Disposition:   Dispo: The patient is from: Home              Anticipated d/c is to: Home              Anticipated d/c  date is: > 24 hours, need general surgery clearance  Antimicrobials:   Anti-infectives (From admission, onward)    None           Objective: Vitals:   09/12/22 2026 09/13/22 0029 09/13/22 0420 09/13/22 0744  BP: 130/80 116/77 (!) 141/88 129/85  Pulse: (!) 55 (!) 48 62 (!) 57  Resp: 18 18 18 18  $ Temp: 97.8 F (36.6 C) 98.1 F (36.7 C) 97.6 F (36.4 C) 97.6 F (36.4 C)  TempSrc: Oral Oral Oral Oral  SpO2: 96% 96% 95% 95%  Weight:      Height:        Intake/Output Summary (Last 24 hours) at 09/13/2022 0919 Last data filed at 09/12/2022 1115 Gross per 24 hour  Intake 194.62 ml  Output --  Net 194.62 ml   Filed Weights   09/12/22 0213  Weight: 79.4 kg    Examination:  General exam: Sleepy (just received antiemetics), but oriented x3 Respiratory system: Clear to auscultation. Respiratory effort  normal. Cardiovascular system:  RRR.  Gastrointestinal system: Abdomen is slightly distended, hypoactive bowel sounds . Central nervous system: orientedx3. No focal neurological deficits. Extremities:  no edema Skin: No rashes, lesions or ulcers Psychiatry: Judgement and insight appear normal. Mood & affect appropriate.     Data Reviewed: I have personally reviewed  labs and visualized  imaging studies since the last encounter and formulate the plan        Scheduled Meds:  enoxaparin (LOVENOX) injection  40 mg Subcutaneous Q24H   insulin aspart  0-15 Units Subcutaneous Q4H   pantoprazole (PROTONIX) IV  40 mg Intravenous Q24H   timolol  1 drop Right Eye BID   Continuous Infusions:  lactated ringers     potassium chloride       LOS: 0 days   Time spent:  49mns  FFlorencia Reasons MD PhD FACP [facility]  Available via Epic secure chat 7am-7pm for nonurgent issues Please page for urgent issues To page the attending provider between 7A-7P or the covering provider during after hours 7P-7A, please log into the web site [email or web address] and access using universal [place] password for that web site. If you do not have the password, please call the hospital operator.    09/13/2022, 9:19 AM

## 2022-09-13 NOTE — Progress Notes (Signed)
Subjective/Chief Complaint: Patient with vomiting overnight.  Mild abdominal pain noted.  No flatus or bowel movement   Objective: Vital signs in last 24 hours: Temp:  [97.5 F (36.4 C)-98.1 F (36.7 C)] 97.6 F (36.4 C) (02/18 0744) Pulse Rate:  [48-62] 57 (02/18 0744) Resp:  [16-20] 18 (02/18 0744) BP: (100-141)/(76-88) 129/85 (02/18 0744) SpO2:  [95 %-99 %] 95 % (02/18 0744) Last BM Date : 09/11/22 (small`)  Intake/Output from previous day: 02/17 0701 - 02/18 0700 In: 289.4 [IV Piggyback:289.4] Out: -  Intake/Output this shift: No intake/output data recorded.  General appearance: alert and cooperative Resp: clear to auscultation bilaterally GI: Soft mild distention no rebound or guarding  Lab Results:  Recent Labs    09/12/22 1314 09/13/22 0543  WBC 4.6 7.5  HGB 13.8 14.9  HCT 41.4 45.3  PLT 291 318   BMET Recent Labs    09/12/22 0405 09/12/22 1314 09/13/22 0543  NA 129*  --  134*  K 2.9*  --  3.5  CL 97*  --  99  CO2 24  --  22  GLUCOSE 166*  --  150*  BUN 12  --  18  CREATININE 0.64 0.62 1.07*  CALCIUM 8.6*  --  9.8   PT/INR No results for input(s): "LABPROT", "INR" in the last 72 hours. ABG No results for input(s): "PHART", "HCO3" in the last 72 hours.  Invalid input(s): "PCO2", "PO2"  Studies/Results: DG Abd Portable 1V-Small Bowel Obstruction Protocol-initial, 8 hr delay  Result Date: 09/13/2022 CLINICAL DATA:  8 hour small-bowel follow-up film EXAM: PORTABLE ABDOMEN - 1 VIEW COMPARISON:  CT from the previous day. FINDINGS: Administered contrast lies predominately within the stomach although some contrast is now seen within the small bowel. Persistent dilated loops in the right mid abdomen are noted. No definitive colonic contrast is noted at this time. IMPRESSION: Contrast within the dilated small bowel although not within the colon. 24 hour follow-up film is recommended. Electronically Signed   By: Inez Catalina M.D.   On: 09/13/2022 00:30    CT ABDOMEN PELVIS W CONTRAST  Result Date: 09/12/2022 CLINICAL DATA:  Acute and nonlocalized abdominal pain. Nausea and vomiting EXAM: CT ABDOMEN AND PELVIS WITH CONTRAST TECHNIQUE: Multidetector CT imaging of the abdomen and pelvis was performed using the standard protocol following bolus administration of intravenous contrast. RADIATION DOSE REDUCTION: This exam was performed according to the departmental dose-optimization program which includes automated exposure control, adjustment of the mA and/or kV according to patient size and/or use of iterative reconstruction technique. CONTRAST:  11m OMNIPAQUE IOHEXOL 300 MG/ML  SOLN COMPARISON:  10/11/2014 FINDINGS: Lower chest:  Coronary atherosclerosis.  Mild dependent atelectasis. Hepatobiliary: No focal liver abnormality.No evidence of biliary obstruction or stone. Pancreas: Unremarkable. Spleen: Unremarkable. Adrenals/Urinary Tract: Negative adrenals. No hydronephrosis or stone. Unremarkable bladder. Stomach/Bowel: Distended loops of small bowel containing fluid, gas, and ultimately fecalization with twisted loops in the right lower quadrant including the terminal ileum. No bowel perforation or inflammation seen. Appendectomy. Vascular/Lymphatic: Atheromatous calcification of the aorta and its branches. No mass or adenopathy. Reproductive:Phleboliths. The left ovary with adjacent phleboliths appears rotated into the midline without ovarian swelling. Other: No ascites or pneumoperitoneum. Musculoskeletal: No acute abnormalities. IMPRESSION: 1. Partial or early small bowel obstruction with transition in the right lower quadrant where there is distortion of small bowel loops in the region of prior appendectomy, presumed adhesive disease. 2.  Aortic Atherosclerosis (ICD10-I70.0).  Coronary atherosclerosis. Electronically Signed   By: JJorje Guild  M.D.   On: 09/12/2022 05:30    Anti-infectives: Anti-infectives (From admission, onward)    None        Assessment/Plan: Small bowel obstruction-she has had vomiting overnight therefore recommend NG tube which she has agreed to.  Continue n.p.o., IV fluids and repeat KUB in a.m.  If no improvement over the next 24 to 48  hours, may need to repeat small bowel protocol versus consider laparotomy   LOS: 0 days    Turner Daniels MD 09/13/2022       I personally reviewed images of KUB showing retained contrast in stomach. I reviewed recent lab values, vitals, and notes.  This care required moderate level of medical decision making.

## 2022-09-14 ENCOUNTER — Encounter (HOSPITAL_COMMUNITY): Payer: Self-pay | Admitting: Internal Medicine

## 2022-09-14 ENCOUNTER — Other Ambulatory Visit: Payer: Self-pay

## 2022-09-14 ENCOUNTER — Inpatient Hospital Stay (HOSPITAL_COMMUNITY): Payer: Medicare Other | Admitting: Certified Registered Nurse Anesthetist

## 2022-09-14 ENCOUNTER — Inpatient Hospital Stay (HOSPITAL_COMMUNITY): Payer: Medicare Other

## 2022-09-14 ENCOUNTER — Encounter (HOSPITAL_COMMUNITY)
Admission: EM | Disposition: A | Discharge status: Home / Self Care | Payer: Self-pay | Source: Home / Self Care | Attending: Internal Medicine

## 2022-09-14 DIAGNOSIS — Z7901 Long term (current) use of anticoagulants: Secondary | ICD-10-CM

## 2022-09-14 DIAGNOSIS — E119 Type 2 diabetes mellitus without complications: Secondary | ICD-10-CM | POA: Diagnosis not present

## 2022-09-14 DIAGNOSIS — E1169 Type 2 diabetes mellitus with other specified complication: Secondary | ICD-10-CM | POA: Diagnosis not present

## 2022-09-14 DIAGNOSIS — E669 Obesity, unspecified: Secondary | ICD-10-CM

## 2022-09-14 DIAGNOSIS — K565 Intestinal adhesions [bands], unspecified as to partial versus complete obstruction: Secondary | ICD-10-CM

## 2022-09-14 DIAGNOSIS — F419 Anxiety disorder, unspecified: Secondary | ICD-10-CM

## 2022-09-14 DIAGNOSIS — E1165 Type 2 diabetes mellitus with hyperglycemia: Secondary | ICD-10-CM

## 2022-09-14 DIAGNOSIS — E785 Hyperlipidemia, unspecified: Secondary | ICD-10-CM | POA: Diagnosis not present

## 2022-09-14 DIAGNOSIS — K56609 Unspecified intestinal obstruction, unspecified as to partial versus complete obstruction: Secondary | ICD-10-CM | POA: Diagnosis not present

## 2022-09-14 DIAGNOSIS — I480 Paroxysmal atrial fibrillation: Secondary | ICD-10-CM

## 2022-09-14 DIAGNOSIS — I10 Essential (primary) hypertension: Secondary | ICD-10-CM

## 2022-09-14 HISTORY — PX: LAPAROTOMY: SHX154

## 2022-09-14 LAB — BASIC METABOLIC PANEL
Anion gap: 11 (ref 5–15)
BUN: 23 mg/dL (ref 8–23)
CO2: 22 mmol/L (ref 22–32)
Calcium: 9.2 mg/dL (ref 8.9–10.3)
Chloride: 101 mmol/L (ref 98–111)
Creatinine, Ser: 0.84 mg/dL (ref 0.44–1.00)
GFR, Estimated: >60 mL/min (ref >60)
Glucose, Bld: 117 mg/dL — ABNORMAL HIGH (ref 70–99)
Potassium: 3.8 mmol/L (ref 3.5–5.1)
Sodium: 134 mmol/L — ABNORMAL LOW (ref 135–145)

## 2022-09-14 LAB — CBC WITH DIFFERENTIAL/PLATELET
Abs Immature Granulocytes: 0.02 10*3/uL (ref 0.00–0.07)
Basophils Absolute: 0 10*3/uL (ref 0.0–0.1)
Basophils Relative: 0 %
Eosinophils Absolute: 0 10*3/uL (ref 0.0–0.5)
Eosinophils Relative: 0 %
HCT: 44 % (ref 36.0–46.0)
Hemoglobin: 14.4 g/dL (ref 12.0–15.0)
Immature Granulocytes: 0 %
Lymphocytes Relative: 21 %
Lymphs Abs: 1.6 10*3/uL (ref 0.7–4.0)
MCH: 30.1 pg (ref 26.0–34.0)
MCHC: 32.7 g/dL (ref 30.0–36.0)
MCV: 92.1 fL (ref 80.0–100.0)
Monocytes Absolute: 0.8 10*3/uL (ref 0.1–1.0)
Monocytes Relative: 10 %
Neutro Abs: 5.1 10*3/uL (ref 1.7–7.7)
Neutrophils Relative %: 69 %
Platelets: 330 10*3/uL (ref 150–400)
RBC: 4.78 MIL/uL (ref 3.87–5.11)
RDW: 13.6 % (ref 11.5–15.5)
WBC: 7.5 10*3/uL (ref 4.0–10.5)
nRBC: 0 % (ref 0.0–0.2)

## 2022-09-14 LAB — GLUCOSE, CAPILLARY
Glucose-Capillary: 120 mg/dL — ABNORMAL HIGH (ref 70–99)
Glucose-Capillary: 120 mg/dL — ABNORMAL HIGH (ref 70–99)
Glucose-Capillary: 124 mg/dL — ABNORMAL HIGH (ref 70–99)
Glucose-Capillary: 129 mg/dL — ABNORMAL HIGH (ref 70–99)
Glucose-Capillary: 130 mg/dL — ABNORMAL HIGH (ref 70–99)
Glucose-Capillary: 143 mg/dL — ABNORMAL HIGH (ref 70–99)
Glucose-Capillary: 155 mg/dL — ABNORMAL HIGH (ref 70–99)
Glucose-Capillary: 90 mg/dL (ref 70–99)

## 2022-09-14 LAB — MAGNESIUM: Magnesium: 2.1 mg/dL (ref 1.7–2.4)

## 2022-09-14 LAB — SURGICAL PCR SCREEN
MRSA, PCR: POSITIVE — AB
Staphylococcus aureus: POSITIVE — AB

## 2022-09-14 LAB — ABO/RH: ABO/RH(D): O POS

## 2022-09-14 LAB — TYPE AND SCREEN
ABO/RH(D): O POS
Antibody Screen: NEGATIVE

## 2022-09-14 LAB — PHOSPHORUS: Phosphorus: 3.5 mg/dL (ref 2.5–4.6)

## 2022-09-14 SURGERY — LAPAROTOMY, EXPLORATORY
Anesthesia: General

## 2022-09-14 MED ORDER — ACETAMINOPHEN 10 MG/ML IV SOLN
1000.0000 mg | Freq: Four times a day (QID) | INTRAVENOUS | Status: AC
  Administered 2022-09-14 – 2022-09-15 (×4): 1000 mg via INTRAVENOUS
  Filled 2022-09-14 (×4): qty 100

## 2022-09-14 MED ORDER — ESMOLOL HCL 100 MG/10ML IV SOLN
INTRAVENOUS | Status: AC
  Filled 2022-09-14: qty 10

## 2022-09-14 MED ORDER — PHENYLEPHRINE HCL (PRESSORS) 10 MG/ML IV SOLN
INTRAVENOUS | Status: DC | PRN
  Administered 2022-09-14 (×3): 160 ug via INTRAVENOUS

## 2022-09-14 MED ORDER — FENTANYL CITRATE PF 50 MCG/ML IJ SOSY
PREFILLED_SYRINGE | INTRAMUSCULAR | Status: AC
  Administered 2022-09-14: 50 ug via INTRAVENOUS
  Filled 2022-09-14: qty 3

## 2022-09-14 MED ORDER — METOPROLOL TARTRATE 5 MG/5ML IV SOLN
5.0000 mg | Freq: Once | INTRAVENOUS | Status: AC
  Administered 2022-09-14: 5 mg via INTRAVENOUS

## 2022-09-14 MED ORDER — OXYCODONE HCL 5 MG/5ML PO SOLN: 5.0000 mg | Freq: Once | ORAL | Status: DC | PRN

## 2022-09-14 MED ORDER — ACETAMINOPHEN 10 MG/ML IV SOLN: 1000.0000 mg | Freq: Once | INTRAVENOUS | Status: DC | PRN

## 2022-09-14 MED ORDER — HYDROMORPHONE HCL 1 MG/ML IJ SOLN
1.0000 mg | Freq: Once | INTRAMUSCULAR | Status: AC
  Administered 2022-09-14: 1 mg via INTRAVENOUS

## 2022-09-14 MED ORDER — ROCURONIUM BROMIDE 100 MG/10ML IV SOLN
INTRAVENOUS | Status: DC | PRN
  Administered 2022-09-14: 10 mg via INTRAVENOUS
  Administered 2022-09-14: 50 mg via INTRAVENOUS

## 2022-09-14 MED ORDER — ENOXAPARIN SODIUM 40 MG/0.4ML IJ SOSY: 40.0000 mg | PREFILLED_SYRINGE | INTRAMUSCULAR | Status: DC

## 2022-09-14 MED ORDER — PROPOFOL 10 MG/ML IV BOLUS
INTRAVENOUS | Status: AC
  Filled 2022-09-14: qty 20

## 2022-09-14 MED ORDER — FENTANYL CITRATE PF 50 MCG/ML IJ SOSY
25.0000 ug | PREFILLED_SYRINGE | INTRAMUSCULAR | Status: DC | PRN
  Administered 2022-09-14 (×2): 50 ug via INTRAVENOUS

## 2022-09-14 MED ORDER — ONDANSETRON HCL 4 MG/2ML IJ SOLN
INTRAMUSCULAR | Status: AC
  Filled 2022-09-14: qty 2

## 2022-09-14 MED ORDER — CHLORHEXIDINE GLUCONATE CLOTH 2 % EX PADS
6.0000 | MEDICATED_PAD | Freq: Every day | CUTANEOUS | Status: AC
  Administered 2022-09-15 – 2022-09-18 (×4): 6 via TOPICAL

## 2022-09-14 MED ORDER — METOPROLOL TARTRATE 5 MG/5ML IV SOLN
INTRAVENOUS | Status: AC
  Filled 2022-09-14: qty 5

## 2022-09-14 MED ORDER — SUCCINYLCHOLINE CHLORIDE 200 MG/10ML IV SOSY
PREFILLED_SYRINGE | INTRAVENOUS | Status: DC | PRN
  Administered 2022-09-14: 120 mg via INTRAVENOUS

## 2022-09-14 MED ORDER — ESMOLOL HCL 100 MG/10ML IV SOLN
INTRAVENOUS | Status: DC | PRN
  Administered 2022-09-14: 30 ug via INTRAVENOUS

## 2022-09-14 MED ORDER — FENTANYL CITRATE (PF) 100 MCG/2ML IJ SOLN
INTRAMUSCULAR | Status: AC
  Filled 2022-09-14: qty 2

## 2022-09-14 MED ORDER — LIDOCAINE HCL (CARDIAC) PF 100 MG/5ML IV SOSY
PREFILLED_SYRINGE | INTRAVENOUS | Status: DC | PRN
  Administered 2022-09-14: 100 mg via INTRAVENOUS

## 2022-09-14 MED ORDER — CHLORHEXIDINE GLUCONATE 0.12 % MT SOLN
15.0000 mL | Freq: Once | OROMUCOSAL | Status: AC
  Administered 2022-09-14: 15 mL via OROMUCOSAL

## 2022-09-14 MED ORDER — ORAL CARE MOUTH RINSE: 15.0000 mL | Freq: Once | OROMUCOSAL | Status: AC

## 2022-09-14 MED ORDER — SUCCINYLCHOLINE CHLORIDE 200 MG/10ML IV SOSY
PREFILLED_SYRINGE | INTRAVENOUS | Status: AC
  Filled 2022-09-14: qty 10

## 2022-09-14 MED ORDER — MUPIROCIN 2 % EX OINT
1.0000 | TOPICAL_OINTMENT | Freq: Two times a day (BID) | CUTANEOUS | Status: AC
  Administered 2022-09-14 – 2022-09-17 (×7): 1 via NASAL
  Filled 2022-09-14 (×3): qty 22

## 2022-09-14 MED ORDER — HYDROMORPHONE HCL 1 MG/ML IJ SOLN
INTRAMUSCULAR | Status: AC
  Filled 2022-09-14: qty 1

## 2022-09-14 MED ORDER — FENTANYL CITRATE (PF) 100 MCG/2ML IJ SOLN
INTRAMUSCULAR | Status: DC | PRN
  Administered 2022-09-14: 100 ug via INTRAVENOUS

## 2022-09-14 MED ORDER — LACTATED RINGERS IV SOLN: INTRAVENOUS | Status: DC

## 2022-09-14 MED ORDER — DEXAMETHASONE SODIUM PHOSPHATE 10 MG/ML IJ SOLN
INTRAMUSCULAR | Status: AC
  Filled 2022-09-14: qty 1

## 2022-09-14 MED ORDER — 0.9 % SODIUM CHLORIDE (POUR BTL) OPTIME
TOPICAL | Status: DC | PRN
  Administered 2022-09-14: 2000 mL

## 2022-09-14 MED ORDER — EPHEDRINE 5 MG/ML INJ
INTRAVENOUS | Status: AC
  Filled 2022-09-14: qty 5

## 2022-09-14 MED ORDER — OXYCODONE HCL 5 MG PO TABS: 5.0000 mg | ORAL_TABLET | Freq: Once | ORAL | Status: DC | PRN

## 2022-09-14 MED ORDER — SUGAMMADEX SODIUM 500 MG/5ML IV SOLN
INTRAVENOUS | Status: DC | PRN
  Administered 2022-09-14: 400 mg via INTRAVENOUS

## 2022-09-14 MED ORDER — ROCURONIUM BROMIDE 10 MG/ML (PF) SYRINGE
PREFILLED_SYRINGE | INTRAVENOUS | Status: AC
  Filled 2022-09-14: qty 10

## 2022-09-14 MED ORDER — AMISULPRIDE (ANTIEMETIC) 5 MG/2ML IV SOLN: 10.0000 mg | Freq: Once | INTRAVENOUS | Status: DC | PRN

## 2022-09-14 MED ORDER — PROPOFOL 10 MG/ML IV BOLUS
INTRAVENOUS | Status: DC | PRN
  Administered 2022-09-14: 100 mg via INTRAVENOUS
  Administered 2022-09-14: 50 mg via INTRAVENOUS

## 2022-09-14 MED ORDER — MIDAZOLAM HCL 2 MG/2ML IJ SOLN
INTRAMUSCULAR | Status: AC
  Filled 2022-09-14: qty 2

## 2022-09-14 MED ORDER — METHOCARBAMOL 1000 MG/10ML IJ SOLN: 500.0000 mg | Freq: Four times a day (QID) | INTRAVENOUS | Status: DC | PRN

## 2022-09-14 MED ORDER — LIDOCAINE HCL (PF) 2 % IJ SOLN
INTRAMUSCULAR | Status: AC
  Filled 2022-09-14: qty 5

## 2022-09-14 MED ORDER — LIDOCAINE VISCOUS HCL 2 % MT SOLN
10.0000 mL | Freq: Once | OROMUCOSAL | Status: AC
  Administered 2022-09-14: 10 mL via OROMUCOSAL
  Filled 2022-09-14: qty 15

## 2022-09-14 MED ORDER — PHENYLEPHRINE 80 MCG/ML (10ML) SYRINGE FOR IV PUSH (FOR BLOOD PRESSURE SUPPORT)
PREFILLED_SYRINGE | INTRAVENOUS | Status: AC
  Filled 2022-09-14: qty 10

## 2022-09-14 MED ORDER — DEXAMETHASONE SODIUM PHOSPHATE 10 MG/ML IJ SOLN
INTRAMUSCULAR | Status: DC | PRN
  Administered 2022-09-14: 8 mg via INTRAVENOUS

## 2022-09-14 MED ORDER — ONDANSETRON HCL 4 MG/2ML IJ SOLN
INTRAMUSCULAR | Status: DC | PRN
  Administered 2022-09-14: 4 mg via INTRAVENOUS

## 2022-09-14 MED ORDER — SODIUM CHLORIDE 0.9 % IV SOLN
2.0000 g | INTRAVENOUS | Status: AC
  Administered 2022-09-14: 2 g via INTRAVENOUS
  Filled 2022-09-14: qty 2

## 2022-09-14 SURGICAL SUPPLY — 47 items
ADH SKN CLS APL DERMABOND .7 (GAUZE/BANDAGES/DRESSINGS) ×1
APL SWBSTK 6 STRL LF DISP (MISCELLANEOUS) ×2
APPLICATOR COTTON TIP 6 STRL (MISCELLANEOUS) ×2 IMPLANT
APPLICATOR COTTON TIP 6IN STRL (MISCELLANEOUS) ×2 IMPLANT
BAG COUNTER SPONGE SURGICOUNT (BAG) IMPLANT
BAG SPNG CNTER NS LX DISP (BAG)
BLADE EXTENDED COATED 6.5IN (ELECTRODE) IMPLANT
BLADE HEX COATED 2.75 (ELECTRODE) ×1 IMPLANT
BLADE SURG SZ10 CARB STEEL (BLADE) IMPLANT
CELLS DAT CNTRL 66122 CELL SVR (MISCELLANEOUS) ×1 IMPLANT
COVER MAYO STAND STRL (DRAPES) ×1 IMPLANT
COVER SURGICAL LIGHT HANDLE (MISCELLANEOUS) ×1 IMPLANT
DERMABOND ADVANCED .7 DNX12 (GAUZE/BANDAGES/DRESSINGS) IMPLANT
DRAIN CHANNEL 19F RND (DRAIN) IMPLANT
DRAPE LAPAROSCOPIC ABDOMINAL (DRAPES) ×1 IMPLANT
DRAPE SHEET LG 3/4 BI-LAMINATE (DRAPES) IMPLANT
DRAPE WARM FLUID 44X44 (DRAPES) ×1 IMPLANT
ELECT REM PT RETURN 15FT ADLT (MISCELLANEOUS) ×1 IMPLANT
EVACUATOR DRAINAGE 10X20 100CC (DRAIN) IMPLANT
EVACUATOR SILICONE 100CC (DRAIN)
GAUZE PAD ABD 8X10 STRL (GAUZE/BANDAGES/DRESSINGS) IMPLANT
GAUZE SPONGE 4X4 12PLY STRL (GAUZE/BANDAGES/DRESSINGS) ×1 IMPLANT
GLOVE BIO SURGEON STRL SZ 6 (GLOVE) ×3 IMPLANT
GLOVE BIOGEL PI IND STRL 6.5 (GLOVE) ×1 IMPLANT
GLOVE BIOGEL PI IND STRL 7.0 (GLOVE) ×1 IMPLANT
GLOVE INDICATOR 6.5 STRL GRN (GLOVE) ×2 IMPLANT
GOWN SPEC L3 XXLG W/TWL (GOWN DISPOSABLE) IMPLANT
GOWN STRL REUS W/ TWL XL LVL3 (GOWN DISPOSABLE) ×3 IMPLANT
GOWN STRL REUS W/TWL 2XL LVL3 (GOWN DISPOSABLE) ×1 IMPLANT
GOWN STRL REUS W/TWL XL LVL3 (GOWN DISPOSABLE) ×3
HANDLE SUCTION POOLE (INSTRUMENTS) ×1 IMPLANT
KIT BASIN OR (CUSTOM PROCEDURE TRAY) ×1 IMPLANT
KIT TURNOVER KIT A (KITS) IMPLANT
LEGGING LITHOTOMY PAIR STRL (DRAPES) IMPLANT
NS IRRIG 1000ML POUR BTL (IV SOLUTION) ×2 IMPLANT
PACK GENERAL/GYN (CUSTOM PROCEDURE TRAY) ×1 IMPLANT
RETRACTOR WND ALEXIS 18 MED (MISCELLANEOUS) IMPLANT
RTRCTR WOUND ALEXIS 18CM MED (MISCELLANEOUS) ×1
STAPLER VISISTAT 35W (STAPLE) ×1 IMPLANT
SUCTION POOLE HANDLE (INSTRUMENTS) ×1
SUT MNCRL AB 4-0 PS2 18 (SUTURE) IMPLANT
SUT PDS AB 0 CTX 60 (SUTURE) IMPLANT
SUT VIC AB 2-0 SH 18 (SUTURE) ×1 IMPLANT
SUT VIC AB 3-0 SH 18 (SUTURE) ×1 IMPLANT
TOWEL OR 17X26 10 PK STRL BLUE (TOWEL DISPOSABLE) ×2 IMPLANT
TRAY FOLEY MTR SLVR 16FR STAT (SET/KITS/TRAYS/PACK) ×1 IMPLANT
YANKAUER SUCT BULB TIP NO VENT (SUCTIONS) ×1 IMPLANT

## 2022-09-14 NOTE — Plan of Care (Signed)
  Problem: Nutrition: Goal: Adequate nutrition will be maintained Outcome: Progressing   Problem: Pain Managment: Goal: General experience of comfort will improve Outcome: Progressing   Problem: Safety: Goal: Ability to remain free from injury will improve Outcome: Progressing   

## 2022-09-14 NOTE — Progress Notes (Addendum)
Subjective: CC: NGT clamped currently. Very nauseated. Stable central band like abdominal pain since admission. No flatus or bm. Required 2 doses of dilaudid and 3 doses of zofran yesterday. No contrast in colon on xray this am.   Reports hx of c-section, ovarian cyst removal and laparoscopic appendectomy (2016)  Afebrile overnight. No tachycardia or hypotension. SBP 97 on last check. NGT 450cc/24 hours. WBC 9.5. K 3.8. Mg 2.1.   Objective: Vital signs in last 24 hours: Temp:  [97.8 F (36.6 C)-99.2 F (37.3 C)] 98.4 F (36.9 C) (02/19 0407) Pulse Rate:  [61-71] 71 (02/19 0407) Resp:  [16-18] 18 (02/19 0407) BP: (97-147)/(84-91) 97/84 (02/19 0407) SpO2:  [94 %-97 %] 94 % (02/19 0407) Last BM Date : 09/11/22 (small`)  Intake/Output from previous day: 02/18 0701 - 02/19 0700 In: 1858.4 [I.V.:1464.4; IV Piggyback:394.1] Out: 450 [Emesis/NG output:450] Intake/Output this shift: No intake/output data recorded.  PE: Gen: Up in the chair, appears uncomfortable.  Abd: Moderate distension but soft. She has central abdominal ttp without rigidity or guarding. Hypoactive BS. NGT in place.  Psych: A&Ox3   Lab Results:  Recent Labs    09/13/22 0543 09/14/22 0429  WBC 7.5 7.5  HGB 14.9 14.4  HCT 45.3 44.0  PLT 318 330   BMET Recent Labs    09/13/22 0543 09/14/22 0429  NA 134* 134*  K 3.5 3.8  CL 99 101  CO2 22 22  GLUCOSE 150* 117*  BUN 18 23  CREATININE 1.07* 0.84  CALCIUM 9.8 9.2   PT/INR No results for input(s): "LABPROT", "INR" in the last 72 hours. CMP     Component Value Date/Time   NA 134 (L) 09/14/2022 0429   K 3.8 09/14/2022 0429   CL 101 09/14/2022 0429   CO2 22 09/14/2022 0429   GLUCOSE 117 (H) 09/14/2022 0429   GLUCOSE 107 (H) 07/13/2006 1017   BUN 23 09/14/2022 0429   CREATININE 0.84 09/14/2022 0429   CREATININE 0.87 01/19/2020 1443   CALCIUM 9.2 09/14/2022 0429   PROT 7.3 09/13/2022 0543   ALBUMIN 4.2 09/13/2022 0543   AST 27  09/13/2022 0543   ALT 21 09/13/2022 0543   ALKPHOS 60 09/13/2022 0543   BILITOT 1.7 (H) 09/13/2022 0543   GFRNONAA >60 09/14/2022 0429   GFRNONAA 82 08/16/2015 1143   GFRAA >60 08/09/2018 0748   GFRAA >89 08/16/2015 1143   Lipase     Component Value Date/Time   LIPASE 31 09/12/2022 0405    Studies/Results: DG Abd 1 View  Result Date: 09/14/2022 CLINICAL DATA:  Small-bowel obstruction EXAM: ABDOMEN - 1 VIEW COMPARISON:  Yesterday FINDINGS: Nasogastric tube tip at gastric body. No free intraperitoneal air. Persistent small bowel dilatation, maximally 3.9 cm. Minimal contrast within mid small bowel loops. Normal caliber colon. No gross free intraperitoneal air. Cardiomegaly. Low pelvis excluded. IMPRESSION: Persistent mild small bowel dilatation, which could represent localized ileus or low-grade partial small bowel obstruction. Electronically Signed   By: Abigail Miyamoto M.D.   On: 09/14/2022 08:11   DG Abd 1 View  Result Date: 09/13/2022 CLINICAL DATA:  Nasogastric tube placement. EXAM: ABDOMEN - 1 VIEW COMPARISON:  09/13/2022 at 12:06 a.m. FINDINGS: Nasogastric tube extends below diaphragm, into the mid to distal stomach. Residual contrast is noted in the gastric fundus. IMPRESSION: Well-positioned nasogastric tube. Electronically Signed   By: Lajean Manes M.D.   On: 09/13/2022 12:42   DG Abd Portable 1V-Small Bowel Obstruction Protocol-initial, 8 hr delay  Result Date: 09/13/2022 CLINICAL DATA:  8 hour small-bowel follow-up film EXAM: PORTABLE ABDOMEN - 1 VIEW COMPARISON:  CT from the previous day. FINDINGS: Administered contrast lies predominately within the stomach although some contrast is now seen within the small bowel. Persistent dilated loops in the right mid abdomen are noted. No definitive colonic contrast is noted at this time. IMPRESSION: Contrast within the dilated small bowel although not within the colon. 24 hour follow-up film is recommended. Electronically Signed   By: Inez Catalina M.D.   On: 09/13/2022 00:30    Anti-infectives: Anti-infectives (From admission, onward)    None        Assessment/Plan SBO - CT 2/17 w/ sbo with transition in RLQ. Hx of C-section, ovarian cyst removal and laparoscopic appendectomy (2016) - Patient does not appear to be improving with conservative management and suspect she will need exploratory laparotomy. Discussed case with my attending who agree's and has seen the patient + consented her. Plan for OR today.   FEN - NPO, NGT, IVF per TRH VTE - SCDs, Eliquis on hold since 2/16. Currently on ppx dosed Lovenox ID - Abx on call to OR (PCN allergy - will confirm with pharmacy if can have pcn/cephalosporins)  Plan - OR   PAF on Eliquis (last dose 2/16) DM2 HTN HLD  I reviewed nursing notes, hospitalist notes, last 24 h vitals and pain scores, last 48 h intake and output, last 24 h labs and trends, and last 24 h imaging results.    LOS: 1 day    Jillyn Ledger , [facility] 09/14/2022, 8:48 AM Please see Amion for pager number during day hours 7:00am-4:30pm

## 2022-09-14 NOTE — Op Note (Signed)
PRE-OPERATIVE DIAGNOSIS: Small bowel obstruction  POST-OPERATIVE DIAGNOSIS:  Same  PROCEDURE:  Procedure(s): Exploratory laparotomy, lysis of adhesions  SURGEON:  Surgeon(s): Stark Klein, MD  Assistant: Saverio Danker, PA-C  ANESTHESIA:   general  DRAINS: none   LOCAL MEDICATIONS USED:  NONE  SPECIMEN:  No Specimen  DISPOSITION OF SPECIMEN:  N/A  COUNTS:  YES  DICTATION: .Dragon Dictation  PLAN OF CARE: back to previous bed with telemetry  PATIENT DISPOSITION:  PACU - hemodynamically stable.  FINDINGS:  closed loop obstruction, NGT in good position.  EBL: min  PROCEDURE:  Patient was identified in the holding area and taken to the operating room where she was placed supine on the operating room table.  Foley catheter was placed and sequential compression devices were hooked up.  General anesthesia was then induced.  The abdomen was prepped and draped in sterile fashion.  A timeout was performed according to the surgical safety checklist.  When all is correct, we continued.  A lower midline incision was made below the umbilicus.  The subcutaneous tissues were divided with the cautery.  The fascia was identified in the midline and the fascial incision was opened up a bit.  The fascia was elevated and the peritoneum was opened small amount sharply.  This was enlarged with a hemostat and the fluid in the abdomen was suctioned out.  The fascial incision was then carried out the length of the skin incision.  The a wound protector was placed.  The bowel was eviscerated and started to be run in one direction.  It appeared initially that there was no transition point as the caliber of the bowel went from dilated to not dilated rather slowly.  However once the bowel was run in the opposite direction, it was apparent that there was a band.  This was freed up manually and a closed-loop obstruction was found.  This was reduced.  The small bowel was then run distally to proximally several  times to make sure there was no evidence of bowel injury and that we were all the way to the ligament of Treitz proximally and the terminal ileum distally.  It appeared most likely that the band originated from the right mid abdomen near the appendectomy site.    The abdomen was irrigated.  The fascia was closed using running 0-0 looped PDS suture.  Skin was irrigated and then closed in 3 layers with 3-0 Vicryl in the Scarpa's layer, Vicryl interrupted deep dermal sutures and 4-0 Monocryl running subcuticular suture.  Wound was then cleaned, dried, and dressed with Dermabond.    Patient tolerated the procedure well.  She was allowed to emerge from general anesthesia.  She was taken the PACU in stable condition.  Needle, sponge, and instrument counts were correct x 2.

## 2022-09-14 NOTE — Progress Notes (Signed)
09/14/2022  1832  Notified Dr Louanne Belton of patient HR 39 lasting about a minute then going back to HR 123. Patient is in Afib.

## 2022-09-14 NOTE — Anesthesia Procedure Notes (Signed)
Procedure Name: Intubation Date/Time: 09/14/2022 1:53 PM  Performed by: Jonna Munro, CRNAPre-anesthesia Checklist: Patient identified, Emergency Drugs available, Suction available, Patient being monitored and Timeout performed Patient Re-evaluated:Patient Re-evaluated prior to induction Oxygen Delivery Method: Circle system utilized Preoxygenation: Pre-oxygenation with 100% oxygen Induction Type: IV induction, Rapid sequence and Cricoid Pressure applied Grade View: Grade I Tube type: Oral Number of attempts: 1 Airway Equipment and Method: Stylet Placement Confirmation: ETT inserted through vocal cords under direct vision, positive ETCO2, CO2 detector and breath sounds checked- equal and bilateral Secured at: 23 cm Tube secured with: Tape Dental Injury: Teeth and Oropharynx as per pre-operative assessment

## 2022-09-14 NOTE — Progress Notes (Signed)
PROGRESS NOTE    Adriana Collins  [id number] DOB: May 18, 1951 DOA: 09/12/2022 PCP: [name], DO     Brief Narrative:  Patient is a 72 years old female with past medical history of hypertension, hyperlipidemia, type 2 diabetes, paroxysmal atrial fibrillation presented to hospital with acute abdominal pain, nausea, vomiting for 4 hours prior to presentation.  Patient does have history of appendectomy in the past.  CT scan of the abdomen pelvis done in the ED showed  partial or early bowel obstruction with transition point in the right lower quadrant in the area of the prior appendectomy.  Patient was initially treated conservatively but did not improve so plan for surgical intervention 09/14/2022.  Assessment & Plan:  Principal Problem:   SBO (small bowel obstruction) (HCC) Active Problems:   Hyperlipidemia with target LDL less than 70   Obesity (BMI 30)   Hyperlipidemia associated with type 2 diabetes mellitus ([place])   Uncontrolled type 2 diabetes mellitus with hyperglycemia (HCC)   Paroxysmal atrial fibrillation (HCC)   Chronic anticoagulation  Small bowel obstruction. -Symptomatic, unimproved.  Continue n.p.o., s/p  Gastrografin orally on 2/17, then vomited overnight, nasogastric tube placed on 2/18, patient unimproved.  Plan for surgical intervention today.  Hypokalemia Improved after replacement.  Will aggressively replenish if necessary.  Paroxysmal atrial fibrillation. Eliquis on hold last dose 09/11/2022.  Continue metoprolol IV with holding parameters.  Diabetes mellitus type 2. Hemoglobin A1c 5.6.  Continue IV fluids. NPO.   DVT prophylaxis: enoxaparin (LOVENOX) injection 40 mg Start: 09/12/22 1800 SCDs Start: 09/12/22 1300   Code Status:   Code Status: Full Code  Family Communication:  Spoke with husband at bedside  Disposition:   Dispo: The patient is from: Home              Anticipated d/c is to: Home              Anticipated d/c date  is: Likely in 2 to 3 days.  Possible surgical intervention today.  Antimicrobials:   Anti-infectives (From admission, onward)    Start     Dose/Rate Route Frequency Ordered Stop   09/14/22 1200  cefoTEtan (CEFOTAN) 2 g in sodium chloride 0.9 % 100 mL IVPB        2 g 200 mL/hr over 30 Minutes Intravenous On call to O.R. 09/14/22 1109 09/15/22 0559       Subjective:  Patient still complains of nausea and bandlike abdominal pain and had required 2 doses of Dilaudid.  Small bowel follow-through exam without any contrast in the colon.  Surgery has plans for surgical intervention at this time.  Objective: Vitals:   09/13/22 0744 09/13/22 1317 09/13/22 1952 09/14/22 0407  BP: 129/85 (!) 147/91 (!) 143/87 97/84  Pulse: (!) 57 61 64 71  Resp: 18 18 16 18  $ Temp: 97.6 F (36.4 C) 97.8 F (36.6 C) 99.2 F (37.3 C) 98.4 F (36.9 C)  TempSrc: Oral  Oral Oral  SpO2: 95% 97% 97% 94%  Weight:      Height:        Intake/Output Summary (Last 24 hours) at 09/14/2022 1121 Last data filed at 09/14/2022 0600 Gross per 24 hour  Intake 1688.55 ml  Output 450 ml  Net 1238.55 ml    Filed Weights   09/12/22 0213  Weight: 79.4 kg    Physical examination: Body mass index is 29.12 kg/m.  General:  Average built, not in obvious distress, NG tube in place. HENT:  No scleral pallor or icterus noted. Oral mucosa is moist.  Chest:  Clear breath sounds.  Diminished breath sounds bilaterally. No crackles or wheezes.  CVS: S1 &S2 heard. No murmur.  Regular rate and rhythm. Abdomen:  mildly distended.  Soft, bowel sounds sluggish  Extremities: No cyanosis, clubbing or edema.  Peripheral pulses are palpable. Psych: Alert, awake and oriented, normal mood CNS:  No cranial nerve deficits.  Power equal in all extremities.  Data Reviewed: I have personally reviewed  labs and visualized  imaging studies    Scheduled Meds:  enoxaparin (LOVENOX) injection  40 mg Subcutaneous Q24H   insulin aspart  0-15  Units Subcutaneous Q4H   metoprolol tartrate  5 mg Intravenous Q8H   pantoprazole (PROTONIX) IV  40 mg Intravenous Q24H   timolol  1 drop Right Eye BID   Continuous Infusions:  cefoTEtan (CEFOTAN) IV       LOS: 1 day   Flora Lipps, MD  [facility] 09/14/2022, 11:21 AM

## 2022-09-14 NOTE — Transfer of Care (Signed)
Immediate Anesthesia Transfer of Care Note  Patient: Adriana Collins  Procedure(s) Performed: EXPLORATORY LAPAROTOMY, LYSIS OF ADHESIONS  Patient Location: PACU  Anesthesia Type:General  Level of Consciousness: awake, alert , and patient cooperative  Airway & Oxygen Therapy: Patient Spontanous Breathing and Patient connected to face mask oxygen  Post-op Assessment: Report given to RN, Post -op Vital signs reviewed and stable, and Patient moving all extremities X 4  Post vital signs: Reviewed and stable  Last Vitals:  Vitals Value Taken Time  BP 146/93 09/14/22 1503  Temp    Pulse 58 09/14/22 1503  Resp 12 09/14/22 1504  SpO2 100 % 09/14/22 1503  Vitals shown include unvalidated device data.  Last Pain:  Vitals:   09/14/22 1237  TempSrc:   PainSc: 0-No pain         Complications: No notable events documented.

## 2022-09-14 NOTE — Anesthesia Postprocedure Evaluation (Signed)
Anesthesia Post Note  Patient: Adriana Collins  Procedure(s) Performed: EXPLORATORY LAPAROTOMY, LYSIS OF ADHESIONS     Patient location during evaluation: PACU Anesthesia Type: General Level of consciousness: awake and alert Pain management: pain level controlled Vital Signs Assessment: post-procedure vital signs reviewed and stable Respiratory status: spontaneous breathing, nonlabored ventilation and respiratory function stable Cardiovascular status: blood pressure returned to baseline Postop Assessment: no apparent nausea or vomiting Anesthetic complications: no   No notable events documented.  Last Vitals:  Vitals:   09/14/22 1630 09/14/22 1652  BP: 121/88 (!) 128/91  Pulse: (!) 101 80  Resp: 16 17  Temp:  36.7 C  SpO2: 99% 98%    Last Pain:  Vitals:   09/14/22 1652  TempSrc: Oral  PainSc:                  Marthenia Rolling

## 2022-09-14 NOTE — Anesthesia Preprocedure Evaluation (Addendum)
Anesthesia Evaluation  Patient identified by MRN, date of birth, ID band Patient awake    Reviewed: Allergy & Precautions, NPO status , Patient's Chart, lab work & pertinent test results, reviewed documented beta blocker date and time   History of Anesthesia Complications Negative for: history of anesthetic complications  Airway Mallampati: II  TM Distance: >3 FB Neck ROM: Full    Dental no notable dental hx.    Pulmonary neg pulmonary ROS   Pulmonary exam normal        Cardiovascular hypertension, Pt. on home beta blockers and Pt. on medications Normal cardiovascular exam+ dysrhythmias Atrial Fibrillation      Neuro/Psych   Anxiety     negative neurological ROS     GI/Hepatic Neg liver ROS, hiatal hernia,GERD  Medicated,,SMALL BOWEL OBSTRUCTION   Endo/Other  diabetes, Type 2    Renal/GU negative Renal ROS  negative genitourinary   Musculoskeletal negative musculoskeletal ROS (+)    Abdominal   Peds  Hematology negative hematology ROS (+)   Anesthesia Other Findings Day of surgery medications reviewed with patient.  Reproductive/Obstetrics negative OB ROS                             Anesthesia Physical Anesthesia Plan  ASA: 3  Anesthesia Plan: General   Post-op Pain Management: Ofirmev IV (intra-op)*   Induction: Intravenous, Rapid sequence and Cricoid pressure planned  PONV Risk Score and Plan: 3 and Treatment may vary due to age or medical condition, Ondansetron, Dexamethasone and Midazolam  Airway Management Planned: Oral ETT  Additional Equipment: None  Intra-op Plan:   Post-operative Plan: Extubation in OR  Informed Consent: I have reviewed the patients History and Physical, chart, labs and discussed the procedure including the risks, benefits and alternatives for the proposed anesthesia with the patient or authorized representative who has indicated his/her  understanding and acceptance.     Dental advisory given  Plan Discussed with: CRNA  Anesthesia Plan Comments:        Anesthesia Quick Evaluation

## 2022-09-15 ENCOUNTER — Encounter (HOSPITAL_COMMUNITY): Payer: Self-pay | Admitting: General Surgery

## 2022-09-15 DIAGNOSIS — K56609 Unspecified intestinal obstruction, unspecified as to partial versus complete obstruction: Secondary | ICD-10-CM | POA: Diagnosis not present

## 2022-09-15 DIAGNOSIS — E1169 Type 2 diabetes mellitus with other specified complication: Secondary | ICD-10-CM | POA: Diagnosis not present

## 2022-09-15 DIAGNOSIS — E785 Hyperlipidemia, unspecified: Secondary | ICD-10-CM | POA: Diagnosis not present

## 2022-09-15 DIAGNOSIS — Z7901 Long term (current) use of anticoagulants: Secondary | ICD-10-CM | POA: Diagnosis not present

## 2022-09-15 LAB — GLUCOSE, CAPILLARY
Glucose-Capillary: 134 mg/dL — ABNORMAL HIGH (ref 70–99)
Glucose-Capillary: 105 mg/dL — ABNORMAL HIGH (ref 70–99)
Glucose-Capillary: 90 mg/dL (ref 70–99)
Glucose-Capillary: 77 mg/dL (ref 70–99)
Glucose-Capillary: 86 mg/dL (ref 70–99)

## 2022-09-15 LAB — APTT
aPTT: 28 seconds (ref 24–36)
aPTT: 73 seconds — ABNORMAL HIGH (ref 24–36)

## 2022-09-15 LAB — BASIC METABOLIC PANEL
Anion gap: 10 (ref 5–15)
BUN: 25 mg/dL — ABNORMAL HIGH (ref 8–23)
CO2: 27 mmol/L (ref 22–32)
Calcium: 8.9 mg/dL (ref 8.9–10.3)
Chloride: 100 mmol/L (ref 98–111)
Creatinine, Ser: 0.97 mg/dL (ref 0.44–1.00)
GFR, Estimated: >60 mL/min (ref >60)
Glucose, Bld: 120 mg/dL — ABNORMAL HIGH (ref 70–99)
Potassium: 4 mmol/L (ref 3.5–5.1)
Sodium: 137 mmol/L (ref 135–145)

## 2022-09-15 LAB — CBC
HCT: 40 % (ref 36.0–46.0)
Hemoglobin: 13.1 g/dL (ref 12.0–15.0)
MCH: 30.7 pg (ref 26.0–34.0)
MCHC: 32.8 g/dL (ref 30.0–36.0)
MCV: 93.7 fL (ref 80.0–100.0)
Platelets: 303 10*3/uL (ref 150–400)
RBC: 4.27 MIL/uL (ref 3.87–5.11)
RDW: 13.7 % (ref 11.5–15.5)
WBC: 7.4 10*3/uL (ref 4.0–10.5)
nRBC: 0 % (ref 0.0–0.2)

## 2022-09-15 LAB — HEPARIN LEVEL (UNFRACTIONATED)
Heparin Unfractionated: 0.14 IU/mL — ABNORMAL LOW (ref 0.30–0.70)
Heparin Unfractionated: 0.53 IU/mL (ref 0.30–0.70)

## 2022-09-15 LAB — MAGNESIUM: Magnesium: 2.2 mg/dL (ref 1.7–2.4)

## 2022-09-15 MED ORDER — HEPARIN (PORCINE) 25000 UT/250ML-% IV SOLN
850.0000 [IU]/h | INTRAVENOUS | Status: DC
  Administered 2022-09-15: 1050 [IU]/h via INTRAVENOUS
  Administered 2022-09-16: 950 [IU]/h via INTRAVENOUS
  Administered 2022-09-17: 850 [IU]/h via INTRAVENOUS
  Filled 2022-09-15 (×3): qty 250

## 2022-09-15 MED ORDER — MENTHOL 3 MG MT LOZG
1.0000 | LOZENGE | OROMUCOSAL | Status: DC | PRN
  Filled 2022-09-15: qty 9

## 2022-09-15 MED ORDER — PHENOL 1.4 % MT LIQD
1.0000 | OROMUCOSAL | Status: DC | PRN
  Filled 2022-09-15: qty 177
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
## 2022-09-15 NOTE — Progress Notes (Signed)
1 Day Post-Op  Subjective: CC: Doing well. Some soreness around her incision. Main complaint is discomfort from NGT. Denies nausea. NGT bilious output. No flatus or bm. Has not voided since surgery. Has not been oob since surgery.   Objective: Vital signs in last 24 hours: Temp:  [97.6 F (36.4 C)-98.2 F (36.8 C)] 98.1 F (36.7 C) (02/20 0358) Pulse Rate:  [53-117] 88 (02/20 0358) Resp:  [11-19] 17 (02/20 0358) BP: (114-146)/(83-93) 122/85 (02/20 0358) SpO2:  [94 %-100 %] 97 % (02/20 0358) Weight:  [79 kg] 79 kg (02/19 1237) Last BM Date : 09/11/22  Intake/Output from previous day: 02/19 0701 - 02/20 0700 In: 1450 [I.V.:1150; IV Piggyback:300] Out: 170 [Emesis/NG output:150; Blood:20] Intake/Output this shift: No intake/output data recorded.  PE: Gen:  Alert, NAD, pleasant Abd: Soft, mild distension, appropriately tender around midline incisions, no rigidity or guarding and otherwise NT, +BS. Midline incision with glue intact appears well and are without drainage, bleeding, or signs of infection  Psych: A&Ox3   Lab Results:  Recent Labs    09/14/22 0429 09/15/22 0553  WBC 7.5 7.4  HGB 14.4 13.1  HCT 44.0 40.0  PLT 330 303   BMET Recent Labs    09/14/22 0429 09/15/22 0553  NA 134* 137  K 3.8 4.0  CL 101 100  CO2 22 27  GLUCOSE 117* 120*  BUN 23 25*  CREATININE 0.84 0.97  CALCIUM 9.2 8.9   PT/INR No results for input(s): "LABPROT", "INR" in the last 72 hours. CMP     Component Value Date/Time   NA 137 09/15/2022 0553   K 4.0 09/15/2022 0553   CL 100 09/15/2022 0553   CO2 27 09/15/2022 0553   GLUCOSE 120 (H) 09/15/2022 0553   GLUCOSE 107 (H) 07/13/2006 1017   BUN 25 (H) 09/15/2022 0553   CREATININE 0.97 09/15/2022 0553   CREATININE 0.87 01/19/2020 1443   CALCIUM 8.9 09/15/2022 0553   PROT 7.3 09/13/2022 0543   ALBUMIN 4.2 09/13/2022 0543   AST 27 09/13/2022 0543   ALT 21 09/13/2022 0543   ALKPHOS 60 09/13/2022 0543   BILITOT 1.7 (H)  09/13/2022 0543   GFRNONAA >60 09/15/2022 0553   GFRNONAA 82 08/16/2015 1143   GFRAA >60 08/09/2018 0748   GFRAA >89 08/16/2015 1143   Lipase     Component Value Date/Time   LIPASE 31 09/12/2022 0405    Studies/Results: DG Abd 1 View  Result Date: 09/14/2022 CLINICAL DATA:  Small-bowel obstruction EXAM: ABDOMEN - 1 VIEW COMPARISON:  Yesterday FINDINGS: Nasogastric tube tip at gastric body. No free intraperitoneal air. Persistent small bowel dilatation, maximally 3.9 cm. Minimal contrast within mid small bowel loops. Normal caliber colon. No gross free intraperitoneal air. Cardiomegaly. Low pelvis excluded. IMPRESSION: Persistent mild small bowel dilatation, which could represent localized ileus or low-grade partial small bowel obstruction. Electronically Signed   By: Abigail Miyamoto M.D.   On: 09/14/2022 08:11   DG Abd 1 View  Result Date: 09/13/2022 CLINICAL DATA:  Nasogastric tube placement. EXAM: ABDOMEN - 1 VIEW COMPARISON:  09/13/2022 at 12:06 a.m. FINDINGS: Nasogastric tube extends below diaphragm, into the mid to distal stomach. Residual contrast is noted in the gastric fundus. IMPRESSION: Well-positioned nasogastric tube. Electronically Signed   By: Lajean Manes M.D.   On: 09/13/2022 12:42    Anti-infectives: Anti-infectives (From admission, onward)    Start     Dose/Rate Route Frequency Ordered Stop   09/14/22 1200  cefoTEtan (CEFOTAN) 2 g  in sodium chloride 0.9 % 100 mL IVPB        2 g 200 mL/hr over 30 Minutes Intravenous On call to O.R. 09/14/22 1109 09/14/22 1412        Assessment/Plan POD 1 s/p Exploratory laparotomy, lysis of adhesions for SBO by Dr. Barry Dienes on 09/14/22 - Keep NGT. AROBF. Added Cepacol lozenges for throat discomfort. Already has Chloraseptic spray ordered. - Bladder scan now. Message sent to RN. Pt has not voided since surgery - IV Tylenol and Robaxin while NPO w/ NGT. IV Dilaudid for breakthrough pain - Mobilize - Pulm toilet  FEN - NPO, NGT to  LIWS, IVF per TRH VTE - SCDs, okay for heparin with no bolus from our standpoint ID - Cefotetan peri-op. None currently.  Foley - None, bladder scan now  Plan - As above.   PAF on Eliquis (last dose 2/16) DM2 HTN HLD   LOS: 2 days    Jillyn Ledger , [facility] 09/15/2022, 9:24 AM Please see Amion for pager number during day hours 7:00am-4:30pm
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
## 2022-09-15 NOTE — Progress Notes (Signed)
PROGRESS NOTE    Adriana Collins  [id number] DOB: 01/19/1951 DOA: 09/12/2022 PCP: [name], DO     Brief Narrative:  Patient is a 72 years old female with past medical history of hypertension, hyperlipidemia, type 2 diabetes, paroxysmal atrial fibrillation presented to hospital with acute abdominal pain, nausea, vomiting for 4 hours prior to presentation.  Patient does have history of appendectomy in the past.  CT scan of the abdomen pelvis done in the ED showed  partial or early bowel obstruction with transition point in the right lower quadrant in the area of the prior appendectomy.  Patient was initially treated conservatively but did not improve so underwent surgical intervention 09/14/2022.  Assessment & Plan:  Principal Problem:   SBO (small bowel obstruction) (HCC) Active Problems:   Hyperlipidemia with target LDL less than 70   Obesity (BMI 30)   Hyperlipidemia associated with type 2 diabetes mellitus ([place])   Uncontrolled type 2 diabetes mellitus with hyperglycemia (HCC)   Paroxysmal atrial fibrillation (HCC)   Chronic anticoagulation  Small bowel obstruction status post exploratory laparotomy on 09/14/2022 Patient remained symptomatic so underwent surgical intervention with laparotomy and lysis of adhesions on 09/14/2022.  Still on NG tube but symptomatically better.  Follow surgical recommendations.    Hypokalemia Improved   Paroxysmal atrial fibrillation. Eliquis on hold last dose 09/11/2022.  Continue metoprolol IV with holding parameters.  Patient seems to be stable.  Diabetes mellitus type 2. Hemoglobin A1c 5.6.  Continue IV fluids. NPO so far.   DVT prophylaxis: enoxaparin (LOVENOX) injection 40 mg Start: 09/15/22 1400 SCDs Start: 09/12/22 1300   Code Status:   Code Status: Full Code  Family Communication:  Spoke with husband at bedside again today.  Disposition:   Dispo: The patient is from: Home              Anticipated d/c is to:  Home              Anticipated d/c date is: Likely in 2 to 3 days.    Antimicrobials:   Anti-infectives (From admission, onward)    Start     Dose/Rate Route Frequency Ordered Stop   09/14/22 1200  cefoTEtan (CEFOTAN) 2 g in sodium chloride 0.9 % 100 mL IVPB        2 g 200 mL/hr over 30 Minutes Intravenous On call to O.R. 09/14/22 1109 09/14/22 1412       Subjective:  Patient was seen and examined at bedside.  Status post surgical intervention yesterday.  Feels better.  Denies any nausea vomiting or abdominal pain has not had flatus or bowel movement.    Objective: Vitals:   09/14/22 1630 09/14/22 1652 09/14/22 2005 09/15/22 0358  BP: 121/88 (!) 128/91 114/85 122/85  Pulse: (!) 101 80 85 88  Resp: 16 17 19 17  $ Temp:  98 F (36.7 C) 97.6 F (36.4 C) 98.1 F (36.7 C)  TempSrc:  Oral    SpO2: 99% 98% 96% 97%  Weight:      Height:        Intake/Output Summary (Last 24 hours) at 09/15/2022 1130 Last data filed at 09/15/2022 1000 Gross per 24 hour  Intake 1450 ml  Output 720 ml  Net 730 ml    Filed Weights   09/12/22 0213 09/14/22 1222 09/14/22 1237  Weight: 79.4 kg 79 kg 79 kg    Physical examination: Body mass index is 28.98 kg/m.  General:  Average built, not in obvious distress,  NG tube in place. HENT:   No scleral pallor or icterus noted. Oral mucosa is moist.  Chest:  Clear breath sounds.  Diminished breath sounds bilaterally. No crackles or wheezes.  CVS: S1 &S2 heard. No murmur.  Regular rate and rhythm. Abdomen: Status post laparotomy with clean dry and intact incision.  Soft on palpation.  Appropriate tenderness noted.  Mild distention. Extremities: No cyanosis, clubbing or edema.  Peripheral pulses are palpable. Psych: Alert, awake and oriented, normal mood CNS:  No cranial nerve deficits.  Power equal in all extremities.  Data Reviewed: I have personally reviewed  labs and visualized  imaging studies    Scheduled Meds:  Chlorhexidine Gluconate Cloth   6 each Topical Q0600   enoxaparin (LOVENOX) injection  40 mg Subcutaneous Q24H   insulin aspart  0-15 Units Subcutaneous Q4H   metoprolol tartrate  5 mg Intravenous Q8H   mupirocin ointment  1 Application Nasal BID   pantoprazole (PROTONIX) IV  40 mg Intravenous Q24H   timolol  1 drop Right Eye BID   Continuous Infusions:  acetaminophen 1,000 mg (09/15/22 [id number])   methocarbamol (ROBAXIN) IV       LOS: 2 days   Flora Lipps, MD  [facility] 09/15/2022, 11:30 AM

## 2022-09-16 DIAGNOSIS — K56609 Unspecified intestinal obstruction, unspecified as to partial versus complete obstruction: Secondary | ICD-10-CM | POA: Diagnosis not present

## 2022-09-16 DIAGNOSIS — E876 Hypokalemia: Secondary | ICD-10-CM

## 2022-09-16 DIAGNOSIS — Z7901 Long term (current) use of anticoagulants: Secondary | ICD-10-CM | POA: Diagnosis not present

## 2022-09-16 DIAGNOSIS — E1169 Type 2 diabetes mellitus with other specified complication: Secondary | ICD-10-CM | POA: Diagnosis not present

## 2022-09-16 DIAGNOSIS — E785 Hyperlipidemia, unspecified: Secondary | ICD-10-CM | POA: Diagnosis not present

## 2022-09-16 LAB — GLUCOSE, CAPILLARY
Glucose-Capillary: 100 mg/dL — ABNORMAL HIGH (ref 70–99)
Glucose-Capillary: 146 mg/dL — ABNORMAL HIGH (ref 70–99)
Glucose-Capillary: 66 mg/dL — ABNORMAL LOW (ref 70–99)
Glucose-Capillary: 68 mg/dL — ABNORMAL LOW (ref 70–99)
Glucose-Capillary: 74 mg/dL (ref 70–99)
Glucose-Capillary: 77 mg/dL (ref 70–99)
Glucose-Capillary: 78 mg/dL (ref 70–99)
Glucose-Capillary: 79 mg/dL (ref 70–99)
Glucose-Capillary: 94 mg/dL (ref 70–99)

## 2022-09-16 LAB — CBC
HCT: 39.8 % (ref 36.0–46.0)
Hemoglobin: 12.8 g/dL (ref 12.0–15.0)
MCH: 30.3 pg (ref 26.0–34.0)
MCHC: 32.2 g/dL (ref 30.0–36.0)
MCV: 94.1 fL (ref 80.0–100.0)
Platelets: 264 10*3/uL (ref 150–400)
RBC: 4.23 MIL/uL (ref 3.87–5.11)
RDW: 13.6 % (ref 11.5–15.5)
WBC: 7.2 10*3/uL (ref 4.0–10.5)
nRBC: 0 % (ref 0.0–0.2)

## 2022-09-16 LAB — BASIC METABOLIC PANEL
Anion gap: 10 (ref 5–15)
BUN: 23 mg/dL (ref 8–23)
CO2: 25 mmol/L (ref 22–32)
Calcium: 8.5 mg/dL — ABNORMAL LOW (ref 8.9–10.3)
Chloride: 103 mmol/L (ref 98–111)
Creatinine, Ser: 0.87 mg/dL (ref 0.44–1.00)
GFR, Estimated: >60 mL/min (ref >60)
Glucose, Bld: 111 mg/dL — ABNORMAL HIGH (ref 70–99)
Potassium: 3.4 mmol/L — ABNORMAL LOW (ref 3.5–5.1)
Sodium: 138 mmol/L (ref 135–145)

## 2022-09-16 LAB — MAGNESIUM: Magnesium: 2.2 mg/dL (ref 1.7–2.4)

## 2022-09-16 LAB — HEPARIN LEVEL (UNFRACTIONATED)
Heparin Unfractionated: 0.79 IU/mL — ABNORMAL HIGH (ref 0.30–0.70)
Heparin Unfractionated: 0.72 IU/mL — ABNORMAL HIGH (ref 0.30–0.70)
Heparin Unfractionated: 0.65 IU/mL (ref 0.30–0.70)

## 2022-09-16 MED ORDER — ACETAMINOPHEN 10 MG/ML IV SOLN
1000.0000 mg | Freq: Four times a day (QID) | INTRAVENOUS | Status: AC
  Administered 2022-09-16 – 2022-09-17 (×4): 1000 mg via INTRAVENOUS
  Filled 2022-09-16 (×4): qty 100

## 2022-09-16 MED ORDER — DEXTROSE 50 % IV SOLN
12.5000 g | Freq: Once | INTRAVENOUS | Status: AC
  Administered 2022-09-16: 12.5 g via INTRAVENOUS

## 2022-09-16 MED ORDER — DEXTROSE 50 % IV SOLN
12.5000 g | INTRAVENOUS | Status: AC
  Administered 2022-09-16: 12.5 g via INTRAVENOUS
  Filled 2022-09-16: qty 50

## 2022-09-16 MED ORDER — POTASSIUM CHLORIDE 10 MEQ/100ML IV SOLN
10.0000 meq | INTRAVENOUS | Status: AC
  Administered 2022-09-16 (×4): 10 meq via INTRAVENOUS
  Filled 2022-09-16 (×4): qty 100

## 2022-09-16 MED ORDER — DEXTROSE 50 % IV SOLN
25.0000 mL | Freq: Once | INTRAVENOUS | Status: AC
  Administered 2022-09-16: 25 mL via INTRAVENOUS
  Filled 2022-09-16: qty 50

## 2022-09-16 MED ORDER — DEXTROSE-NACL 5-0.9 % IV SOLN: INTRAVENOUS | Status: AC
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
## 2022-09-16 NOTE — Progress Notes (Signed)
2 Days Post-Op  Subjective: CC: Doing well. Some soreness around her incision. Main complaint is discomfort from NGT. Using chloraseptic spray and eating ice chips. Occasional waves of nausea. NGT with 200cc/24 hours, scan bilious output in cannister. No flatus or bm but feels pressure in lower abdomen like she needs to. Voiding without issues. Oob yesterday. Discussed with RN that NGT can be clamped for mobilization.   Patients sister at bedside.   Objective: Vital signs in last 24 hours: Temp:  [98.3 F (36.8 C)-99.1 F (37.3 C)] 98.3 F (36.8 C) (02/21 0400) Pulse Rate:  [88-92] 92 (02/21 0400) Resp:  [17-20] 17 (02/21 0400) BP: (108-118)/(77-81) 108/81 (02/21 0400) SpO2:  [93 %-97 %] 93 % (02/21 0400) Last BM Date : 09/11/22  Intake/Output from previous day: 02/20 0701 - 02/21 0700 In: 151.7 [I.V.:151.7] Out: 750 [Urine:550; Emesis/NG output:200] Intake/Output this shift: No intake/output data recorded.  PE: Gen:  Alert, NAD, pleasant Abd: Soft, mild distension, appropriately tender around midline incisions, no rigidity or guarding and otherwise NT, some BS. Midline incision with glue intact appears well and are without drainage, bleeding, or signs of infection. NGT in place.  Psych: A&Ox3   Lab Results:  Recent Labs    09/15/22 0553 09/16/22 0539  WBC 7.4 7.2  HGB 13.1 12.8  HCT 40.0 39.8  PLT 303 264    BMET Recent Labs    09/15/22 0553 09/16/22 0539  NA 137 138  K 4.0 3.4*  CL 100 103  CO2 27 25  GLUCOSE 120* 111*  BUN 25* 23  CREATININE 0.97 0.87  CALCIUM 8.9 8.5*    PT/INR No results for input(s): "LABPROT", "INR" in the last 72 hours. CMP     Component Value Date/Time   NA 138 09/16/2022 0539   K 3.4 (L) 09/16/2022 0539   CL 103 09/16/2022 0539   CO2 25 09/16/2022 0539   GLUCOSE 111 (H) 09/16/2022 0539   GLUCOSE 107 (H) 07/13/2006 1017   BUN 23 09/16/2022 0539   CREATININE 0.87 09/16/2022 0539   CREATININE 0.87 01/19/2020 1443    CALCIUM 8.5 (L) 09/16/2022 0539   PROT 7.3 09/13/2022 0543   ALBUMIN 4.2 09/13/2022 0543   AST 27 09/13/2022 0543   ALT 21 09/13/2022 0543   ALKPHOS 60 09/13/2022 0543   BILITOT 1.7 (H) 09/13/2022 0543   GFRNONAA >60 09/16/2022 0539   GFRNONAA 82 08/16/2015 1143   GFRAA >60 08/09/2018 0748   GFRAA >89 08/16/2015 1143   Lipase     Component Value Date/Time   LIPASE 31 09/12/2022 0405    Studies/Results: No results found.  Anti-infectives: Anti-infectives (From admission, onward)    Start     Dose/Rate Route Frequency Ordered Stop   09/14/22 1200  cefoTEtan (CEFOTAN) 2 g in sodium chloride 0.9 % 100 mL IVPB        2 g 200 mL/hr over 30 Minutes Intravenous On call to O.R. 09/14/22 1109 09/14/22 1412        Assessment/Plan POD 2 s/p Exploratory laparotomy, lysis of adhesions for SBO by Dr. Barry Dienes on 09/14/22 - Keep NGT. AROBF. Cepacol lozenges and Chloraseptic spray for throat discomfort. Okay for ice chips - IV Tylenol and Robaxin while NPO w/ NGT. IV Dilaudid for breakthrough pain - Mobilize, okay for NGT to be clamped for mobilization. Rehook to [facility] when back to room.  - Pulm toilet  FEN - NPO, NGT to LIWS, IVF per TRH. Replace K (3.4)  VTE -  SCDs, heparin gtt ID - Cefotetan peri-op. None currently.  Foley - None, voiding. Plan - As above.   PAF on Eliquis (last dose 2/16) DM2 HTN HLD   LOS: 3 days    Jillyn Ledger , [facility] 09/16/2022, 8:29 AM Please see Amion for pager number during day hours 7:00am-4:30pm

## 2022-09-16 NOTE — Plan of Care (Signed)

## 2022-09-16 NOTE — Inpatient Diabetes Management (Signed)
Inpatient Diabetes Program Recommendations  AACE/ADA: New Consensus Statement on Inpatient Glycemic Control (2015)  Target Ranges:  Prepandial:   less than 140 mg/dL      Peak postprandial:   less than 180 mg/dL (1-2 hours)      Critically ill patients:  140 - 180 mg/dL   Lab Results  Component Value Date   GLUCAP 78 09/16/2022   HGBA1C 5.6 09/12/2022    Review of Glycemic Control  Latest Reference Range & Units 09/15/22 07:33 09/15/22 11:28 09/15/22 16:37 09/15/22 20:10 09/16/22 01:06 09/16/22 04:39 09/16/22 06:30 09/16/22 07:49  Glucose-Capillary 70 - 99 mg/dL 105 (H) 90 77 86 79 66 (L) 100 (H) 78   Diabetes history: DM 2 Outpatient Diabetes medications: None Current orders for Inpatient glycemic control:  Novolog 0-15 units Q4 hours  Inpatient Diabetes Program Recommendations:    Note hypoglycemia this am of 66  -  reduce Novolog correction scale to sensitive 0-9 units Q4  Thanks,  Tama Headings RN, MSN, BC-ADM Inpatient Diabetes Coordinator Team Pager [phone] (8a-5p)
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
## 2022-09-16 NOTE — Progress Notes (Signed)
PROGRESS NOTE    Adriana Collins  [id number] DOB: 04-26-1951 DOA: 09/12/2022 PCP: [name], DO     Brief Narrative:  Patient is a 72 years old female with past medical history of hypertension, hyperlipidemia, type 2 diabetes, paroxysmal atrial fibrillation presented to hospital with acute abdominal pain, nausea, vomiting for 4 hours prior to presentation.  Patient does have history of appendectomy in the past.  CT scan of the abdomen pelvis done in the ED showed  partial or early bowel obstruction with transition point in the right lower quadrant in the area of the prior appendectomy.  Patient was initially treated conservatively but did not improve so underwent surgical intervention 09/14/2022.  Assessment & Plan:  Principal Problem:   SBO (small bowel obstruction) (HCC) Active Problems:   Hyperlipidemia with target LDL less than 70   Obesity (BMI 30)   Hyperlipidemia associated with type 2 diabetes mellitus ([place])   Uncontrolled type 2 diabetes mellitus with hyperglycemia (HCC)   Paroxysmal atrial fibrillation (HCC)   Chronic anticoagulation  Small bowel obstruction status post exploratory laparotomy on 09/14/2022 Patient remained symptomatic so underwent surgical intervention with laparotomy and lysis of adhesions on 09/14/2022.  Still on NG tube, has not had bowel movement or return of bowel function.  Follow surgical recommendations.  Continue n.p.o. IV fluids NG tube.  Hypokalemia Mildly low today.  Will replace with IV potassium chloride 40 mEq.  Check BMP in AM.  Paroxysmal atrial fibrillation. Eliquis on hold last dose 09/11/2022.  Has been restarted on heparin drip in the interim.  Continue metoprolol IV with holding parameters.  Blood pressure and heart rate is controlled at this time.  Diabetes mellitus type 2. Hemoglobin A1c 5.6.  Continue IV fluids. NPO so far.  Latest POC glucose of 74.   DVT prophylaxis: SCDs Start: 09/12/22 1300   Code  Status:   Code Status: Full Code  Family Communication:  Spoke with husband at bedside again today.  Disposition:   Dispo: The patient is from: Home              Anticipated d/c is to: Home              Anticipated d/c date is: Likely in 2 to 3 days when okay from surgical point of view.    Antimicrobials:   Anti-infectives (From admission, onward)    Start     Dose/Rate Route Frequency Ordered Stop   09/14/22 1200  cefoTEtan (CEFOTAN) 2 g in sodium chloride 0.9 % 100 mL IVPB        2 g 200 mL/hr over 30 Minutes Intravenous On call to O.R. 09/14/22 1109 09/14/22 1412       Subjective:  Patient was seen and examined at bedside.  Patient is still has not had flatus or bowel movement.  No nausea or vomiting.  Still got NG tube in place but complains of sore throat and discomfort.    Objective: Vitals:   09/15/22 0358 09/15/22 1442 09/15/22 1929 09/16/22 0400  BP: 122/85 118/77 112/77 108/81  Pulse: 88 88 89 92  Resp: 17 20 18 17  $ Temp: 98.1 F (36.7 C) 99.1 F (37.3 C) 99.1 F (37.3 C) 98.3 F (36.8 C)  TempSrc:  Oral Oral Oral  SpO2: 97% 97% 95% 93%  Weight:      Height:        Intake/Output Summary (Last 24 hours) at 09/16/2022 1301 Last data filed at 09/16/2022 0500 Gross per 24  hour  Intake 151.74 ml  Output 200 ml  Net -48.26 ml    Filed Weights   09/12/22 0213 09/14/22 1222 09/14/22 1237  Weight: 79.4 kg 79 kg 79 kg    Physical examination: Body mass index is 28.98 kg/m.  General: Alert awake and Communicative, not in obvious distress, NG tube in place. HENT:   No scleral pallor or icterus noted. Oral mucosa is moist.  Chest:  Clear breath sounds.  No crackles or wheezes.  CVS: S1 &S2 heard. No murmur.  Regular rate and rhythm. Abdomen: Surgical incision site clean dry and intact.  Soft on palpation.  Appropriate tenderness noted.   Extremities: No cyanosis, clubbing or edema.  Peripheral pulses are palpable. Psych: Alert, awake and oriented, normal  mood CNS:  No cranial nerve deficits.  Power equal in all extremities.  Data Reviewed: I have personally reviewed  labs and visualized  imaging studies    Scheduled Meds:  Chlorhexidine Gluconate Cloth  6 each Topical Q0600   insulin aspart  0-15 Units Subcutaneous Q4H   metoprolol tartrate  5 mg Intravenous Q8H   mupirocin ointment  1 Application Nasal BID   pantoprazole (PROTONIX) IV  40 mg Intravenous Q24H   timolol  1 drop Right Eye BID   Continuous Infusions:  acetaminophen 1,000 mg (09/16/22 1251)   heparin 950 Units/hr (09/16/22 1149)   methocarbamol (ROBAXIN) IV       LOS: 3 days   Flora Lipps, MD  [facility] 09/16/2022, 1:01 PM

## 2022-09-17 DIAGNOSIS — E1169 Type 2 diabetes mellitus with other specified complication: Secondary | ICD-10-CM | POA: Diagnosis not present

## 2022-09-17 DIAGNOSIS — K56609 Unspecified intestinal obstruction, unspecified as to partial versus complete obstruction: Secondary | ICD-10-CM | POA: Diagnosis not present

## 2022-09-17 DIAGNOSIS — Z7901 Long term (current) use of anticoagulants: Secondary | ICD-10-CM | POA: Diagnosis not present

## 2022-09-17 DIAGNOSIS — E785 Hyperlipidemia, unspecified: Secondary | ICD-10-CM | POA: Diagnosis not present

## 2022-09-17 LAB — CBC
HCT: 39 % (ref 36.0–46.0)
Hemoglobin: 12.9 g/dL (ref 12.0–15.0)
MCH: 31.1 pg (ref 26.0–34.0)
MCHC: 33.1 g/dL (ref 30.0–36.0)
MCV: 94 fL (ref 80.0–100.0)
Platelets: 262 10*3/uL (ref 150–400)
RBC: 4.15 MIL/uL (ref 3.87–5.11)
RDW: 13.2 % (ref 11.5–15.5)
WBC: 6.9 10*3/uL (ref 4.0–10.5)
nRBC: 0 % (ref 0.0–0.2)

## 2022-09-17 LAB — BASIC METABOLIC PANEL
Anion gap: 11 (ref 5–15)
BUN: 13 mg/dL (ref 8–23)
CO2: 26 mmol/L (ref 22–32)
Calcium: 8.5 mg/dL — ABNORMAL LOW (ref 8.9–10.3)
Chloride: 99 mmol/L (ref 98–111)
Creatinine, Ser: 0.71 mg/dL (ref 0.44–1.00)
GFR, Estimated: >60 mL/min (ref >60)
Glucose, Bld: 81 mg/dL (ref 70–99)
Potassium: 3.8 mmol/L (ref 3.5–5.1)
Sodium: 136 mmol/L (ref 135–145)

## 2022-09-17 LAB — HEPARIN LEVEL (UNFRACTIONATED): Heparin Unfractionated: 0.52 IU/mL (ref 0.30–0.70)

## 2022-09-17 LAB — GLUCOSE, CAPILLARY
Glucose-Capillary: 105 mg/dL — ABNORMAL HIGH (ref 70–99)
Glucose-Capillary: 127 mg/dL — ABNORMAL HIGH (ref 70–99)
Glucose-Capillary: 61 mg/dL — ABNORMAL LOW (ref 70–99)
Glucose-Capillary: 65 mg/dL — ABNORMAL LOW (ref 70–99)
Glucose-Capillary: 76 mg/dL (ref 70–99)
Glucose-Capillary: 84 mg/dL (ref 70–99)

## 2022-09-17 LAB — MAGNESIUM: Magnesium: 2.2 mg/dL (ref 1.7–2.4)

## 2022-09-17 MED ORDER — METHOCARBAMOL 500 MG PO TABS: 500.0000 mg | ORAL_TABLET | Freq: Four times a day (QID) | ORAL | Status: DC | PRN

## 2022-09-17 MED ORDER — MUPIROCIN 2 % EX OINT
1.0000 | TOPICAL_OINTMENT | Freq: Two times a day (BID) | CUTANEOUS | Status: DC
  Administered 2022-09-17 – 2022-09-20 (×6): 1 via NASAL
  Filled 2022-09-17: qty 22

## 2022-09-17 MED ORDER — ACETAMINOPHEN 500 MG PO TABS: 1000.0000 mg | ORAL_TABLET | Freq: Four times a day (QID) | ORAL | Status: DC | PRN

## 2022-09-17 MED ORDER — OXYCODONE HCL 5 MG PO TABS
5.0000 mg | ORAL_TABLET | ORAL | Status: DC | PRN
  Administered 2022-09-18: 10 mg via ORAL
  Filled 2022-09-17: qty 2
  Filled 2022-09-17: qty 1

## 2022-09-17 NOTE — Care Management Important Message (Signed)
Important Message  Patient Details IM Letter given. Name: Giovana Fehlman MRN: [id number] Date of Birth: 27-May-1951   Medicare Important Message Given:  Yes     Kerin Salen 09/17/2022, 11:37 AM

## 2022-09-17 NOTE — Progress Notes (Signed)
PROGRESS NOTE    Adriana Collins  [id number] DOB: 1950/11/18 DOA: 09/12/2022 PCP: [name], DO     Brief Narrative:  Patient is a 72 years old female with past medical history of hypertension, hyperlipidemia, type 2 diabetes, paroxysmal atrial fibrillation presented to hospital with acute abdominal pain, nausea, vomiting for 4 hours prior to presentation.  Patient does have history of appendectomy in the past.  CT scan of the abdomen pelvis done in the ED showed  partial or early bowel obstruction with transition point in the right lower quadrant in the area of the prior appendectomy.  Patient was initially treated conservatively but did not improve so underwent surgical intervention 09/14/2022.  Assessment & Plan:  Principal Problem:   SBO (small bowel obstruction) (HCC) Active Problems:   Hyperlipidemia with target LDL less than 70   Obesity (BMI 30)   Hyperlipidemia associated with type 2 diabetes mellitus ([place])   Uncontrolled type 2 diabetes mellitus with hyperglycemia (HCC)   Paroxysmal atrial fibrillation (HCC)   Chronic anticoagulation   Hypokalemia  Small bowel obstruction status post exploratory laparotomy on 09/14/2022 Patient remained symptomatic so underwent surgical intervention with laparotomy and lysis of adhesions on 09/14/2022.  Off NG tube today. Has not had bowel movement did have some flatus.  Has been started on clears at this time.  Hypokalemia Replenished.  Latest potassium of 3.8  Paroxysmal atrial fibrillation. Eliquis on hold, last dose 09/11/2022.  Has been restarted on heparin drip in the interim.  Continue metoprolol IV with holding parameters.  Blood pressure and heart rate is controlled at this time.  Will restart the patient on Eliquis when okay with surgery and p.o. tolerance.  Diabetes mellitus type 2. Hemoglobin A1c 5.6.  Continue IV fluids.   Latest POC glucose of 127   DVT prophylaxis: SCDs Start: 09/12/22 1300   Code  Status:   Code Status: Full Code  Family Communication:  Spoke with husband at bedside   Disposition:   Dispo: The patient is from: Home              Anticipated d/c is to: Home              Anticipated d/c date is: Likely in 2 to 3 days when okay from surgical point of view.    Antimicrobials:   Anti-infectives (From admission, onward)    Start     Dose/Rate Route Frequency Ordered Stop   09/14/22 1200  cefoTEtan (CEFOTAN) 2 g in sodium chloride 0.9 % 100 mL IVPB        2 g 200 mL/hr over 30 Minutes Intravenous On call to O.R. 09/14/22 1109 09/14/22 1412       Subjective:  Patient was seen and examined at bedside.  Patient complains of sore throat.  NG tube has been removed.  Was passing flatus but no bowel movement.  Denies any nausea vomiting or abdominal pain.  Objective: Vitals:   09/16/22 1940 09/16/22 2118 09/17/22 0352 09/17/22 0606  BP: 133/87 (!) 131/94 (!) 118/90 (!) 121/92  Pulse: 94 67 92 94  Resp: 18  18   Temp: 97.9 F (36.6 C)  98.1 F (36.7 C)   TempSrc: Oral  Oral   SpO2: 96%  95%   Weight:      Height:        Intake/Output Summary (Last 24 hours) at 09/17/2022 1342 Last data filed at 09/17/2022 0841 Gross per 24 hour  Intake 0 ml  Output  300 ml  Net -300 ml    Filed Weights   09/12/22 0213 09/14/22 1222 09/14/22 1237  Weight: 79.4 kg 79 kg 79 kg    Physical examination: Body mass index is 28.98 kg/m.   General: Alert awake and Communicative, not in obvious distress, HENT:   No scleral pallor or icterus noted. Oral mucosa is moist.  Chest:  Clear breath sounds.  No crackles or wheezes.  CVS: S1 &S2 heard. No murmur.  Regular rate and rhythm. Abdomen:, Nontender, bowel sounds are present.  Surgical incision clean dry and intact. Extremities: No cyanosis, clubbing or edema.  Peripheral pulses are palpable. Psych: Alert, awake and oriented, normal mood CNS:  No cranial nerve deficits.    Data Reviewed: I have personally reviewed  labs  and visualized  imaging studies    Scheduled Meds:  Chlorhexidine Gluconate Cloth  6 each Topical Q0600   insulin aspart  0-15 Units Subcutaneous Q4H   metoprolol tartrate  5 mg Intravenous Q8H   mupirocin ointment  1 Application Nasal BID   pantoprazole (PROTONIX) IV  40 mg Intravenous Q24H   timolol  1 drop Right Eye BID   Continuous Infusions:  heparin 850 Units/hr (09/16/22 1518)     LOS: 4 days   Flora Lipps, MD  [facility] 09/17/2022, 1:42 PM

## 2022-09-17 NOTE — Progress Notes (Signed)
NGT D/C per protocol, dr order. Pt tol well. No distress noted. Pt now on clear liquid diet as well
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
## 2022-09-17 NOTE — Progress Notes (Signed)
    3 Days Post-Op  Subjective: CC: Doing well. Some soreness around her incision. Main complaint is discomfort from NGT still. No nausea. Passing flatus. No bm. NGT with 300cc/24 hours. Voiding without issues. Oob yesterday and mobilizing in halls.   Patients husband at bedside.   Objective: Vital signs in last 24 hours: Temp:  [97.9 F (36.6 C)-98.1 F (36.7 C)] 98.1 F (36.7 C) (02/22 0352) Pulse Rate:  [67-94] 94 (02/22 0606) Resp:  [18] 18 (02/22 0352) BP: (118-133)/(87-94) 121/92 (02/22 0606) SpO2:  [95 %-96 %] 95 % (02/22 0352) Last BM Date : 09/11/22  Intake/Output from previous day: 02/21 0701 - 02/22 0700 In: 0  Out: 300 [Emesis/NG output:300] Intake/Output this shift: No intake/output data recorded.  PE: Gen:  Alert, NAD, pleasant Abd: Soft, mild distension, appropriately tender around midline incisions, no rigidity or guarding and otherwise NT, + BS. Midline incision with glue intact appears well and are without drainage, bleeding, or signs of infection. NGT in place.  Psych: A&Ox3   Lab Results:  Recent Labs    09/16/22 0539 09/17/22 0839  WBC 7.2 6.9  HGB 12.8 12.9  HCT 39.8 39.0  PLT 264 262    BMET Recent Labs    09/15/22 0553 09/16/22 0539  NA 137 138  K 4.0 3.4*  CL 100 103  CO2 27 25  GLUCOSE 120* 111*  BUN 25* 23  CREATININE 0.97 0.87  CALCIUM 8.9 8.5*    PT/INR No results for input(s): "LABPROT", "INR" in the last 72 hours. CMP     Component Value Date/Time   NA 138 09/16/2022 0539   K 3.4 (L) 09/16/2022 0539   CL 103 09/16/2022 0539   CO2 25 09/16/2022 0539   GLUCOSE 111 (H) 09/16/2022 0539   GLUCOSE 107 (H) 07/13/2006 1017   BUN 23 09/16/2022 0539   CREATININE 0.87 09/16/2022 0539   CREATININE 0.87 01/19/2020 1443   CALCIUM 8.5 (L) 09/16/2022 0539   PROT 7.3 09/13/2022 0543   ALBUMIN 4.2 09/13/2022 0543   AST 27 09/13/2022 0543   ALT 21 09/13/2022 0543   ALKPHOS 60 09/13/2022 0543   BILITOT 1.7 (H) 09/13/2022 0543    GFRNONAA >60 09/16/2022 0539   GFRNONAA 82 08/16/2015 1143   GFRAA >60 08/09/2018 0748   GFRAA >89 08/16/2015 1143   Lipase     Component Value Date/Time   LIPASE 31 09/12/2022 0405    Studies/Results: No results found.  Anti-infectives: Anti-infectives (From admission, onward)    Start     Dose/Rate Route Frequency Ordered Stop   09/14/22 1200  cefoTEtan (CEFOTAN) 2 g in sodium chloride 0.9 % 100 mL IVPB        2 g 200 mL/hr over 30 Minutes Intravenous On call to O.R. 09/14/22 1109 09/14/22 1412        Assessment/Plan POD 3 s/p Exploratory laparotomy, lysis of adhesions for SBO by Dr. Barry Dienes on 09/14/22 - D/c NGT. Adv to cld - Add po pain control options.  - Mobilize - Pulm toilet  FEN - D/c NGT. CLD. IVF per TRH.   VTE - SCDs, heparin gtt ID - Cefotetan peri-op. None currently.  Foley - None, voiding. Plan - As above.   PAF on Eliquis (last dose 2/16) DM2 HTN HLD   LOS: 4 days    Jillyn Ledger , [facility] 09/17/2022, 8:57 AM Please see Amion for pager number during day hours 7:00am-4:30pm

## 2022-09-17 NOTE — Progress Notes (Signed)
ANTICOAGULATION CONSULT NOTE - Follow Up Consult  Pharmacy Consult for Heparin Indication: atrial fibrillation, apixaban held  Allergies  Allergen Reactions   Adhesive [Tape] Other (See Comments)    BandAids   Amoxil [Amoxicillin] Other (See Comments)    Loopy feeling   Azor [Amlodipine-Olmesartan] Other (See Comments)    Pain    Bystolic [Nebivolol Hcl] Itching   Crestor [Rosuvastatin] Other (See Comments)    Pain    Diltiazem Nausea And Vomiting   Ezetimibe Other (See Comments)    Causes heart to race.   Lipitor [Atorvastatin] Other (See Comments)    Pain    Lisinopril Cough   Milk-Related Compounds Other (See Comments)    Lactose intolerance   Prednisone Other (See Comments)    Elevated BP   Sulfonamide Derivatives     REACTION: HIVES   Diovan [Valsartan] Palpitations    Patient Measurements: Height: 5' 5"$  (165.1 cm) Weight: 79 kg (174 lb 2.6 oz) IBW/kg (Calculated) : 57 Heparin Dosing Weight: 73.5 kg  Vital Signs: Temp: 98.1 F (36.7 C) (02/22 0352) Temp Source: Oral (02/22 0352) BP: 121/92 (02/22 0606) Pulse Rate: 94 (02/22 0606)  Labs: Recent Labs    09/15/22 0553 09/15/22 1417 09/15/22 1417 09/15/22 2120 09/16/22 0539 09/16/22 1421 09/16/22 2323  HGB 13.1  --   --   --  12.8  --   --   HCT 40.0  --   --   --  39.8  --   --   PLT 303  --   --   --  264  --   --   APTT  --  28  --  73*  --   --   --   HEPARINUNFRC  --  0.14*   < > 0.53 0.79* 0.72* 0.65  CREATININE 0.97  --   --   --  0.87  --   --    < > = values in this interval not displayed.    Estimated Creatinine Clearance: 62.5 mL/min (by C-G formula based on SCr of 0.87 mg/dL).   Medications:  Scheduled:   Chlorhexidine Gluconate Cloth  6 each Topical Q0600   insulin aspart  0-15 Units Subcutaneous Q4H   metoprolol tartrate  5 mg Intravenous Q8H   mupirocin ointment  1 Application Nasal BID   pantoprazole (PROTONIX) IV  40 mg Intravenous Q24H   timolol  1 drop Right Eye BID    Infusions:   dextrose 5 % and 0.9% NaCl 25 mL/hr at 09/16/22 2021   heparin 850 Units/hr (09/16/22 1518)   methocarbamol (ROBAXIN) IV      Assessment: 43 yoF admitted on 2/17 with abdominal pain, found to have SBO s/p  Exploratory laparotomy, lysis of adhesions on 2/19.  PMH is significant for Afib on Eliquis that was held for surgery.  Her last dose was on 2/16 at 1500.  Inpatient she was on started on Lovenox 2/17-2/18 with most recent dose on 2/18 at 1700.  CCS says OK to anticoagulate with heparin, no bolus.  Pharmacy consulted for heparin dosing 2/20.    Today, 09/17/2022:  - Heparin level 0.52, therapeutic on heparin 850 units/hr - CBC: remains WNL - No bleeding or complications reported - Awaiting GI function, ability to tolerate PO.  NG tube removed 2/22 and advanced to CLD.    Goal of Therapy:  Heparin level 0.3-0.7 units/ml Monitor platelets by anticoagulation protocol: Yes   Plan:  Continue heparin IV infusion at 850 units/hr Daily  heparin level and CBC Follow up procedural plans vs ability to resume oral anticoagulant.    Gretta Arab PharmD, BCPS [facility] main pharmacy [phone] 09/17/2022 8:13 AM

## 2022-09-18 DIAGNOSIS — Z7901 Long term (current) use of anticoagulants: Secondary | ICD-10-CM | POA: Diagnosis not present

## 2022-09-18 DIAGNOSIS — E785 Hyperlipidemia, unspecified: Secondary | ICD-10-CM | POA: Diagnosis not present

## 2022-09-18 DIAGNOSIS — E1169 Type 2 diabetes mellitus with other specified complication: Secondary | ICD-10-CM | POA: Diagnosis not present

## 2022-09-18 DIAGNOSIS — K56609 Unspecified intestinal obstruction, unspecified as to partial versus complete obstruction: Secondary | ICD-10-CM | POA: Diagnosis not present

## 2022-09-18 LAB — MAGNESIUM: Magnesium: 2.1 mg/dL (ref 1.7–2.4)

## 2022-09-18 LAB — BASIC METABOLIC PANEL
Anion gap: 12 (ref 5–15)
BUN: 16 mg/dL (ref 8–23)
CO2: 25 mmol/L (ref 22–32)
Calcium: 8.7 mg/dL — ABNORMAL LOW (ref 8.9–10.3)
Chloride: 99 mmol/L (ref 98–111)
Creatinine, Ser: 0.61 mg/dL (ref 0.44–1.00)
GFR, Estimated: >60 mL/min (ref >60)
Glucose, Bld: 84 mg/dL (ref 70–99)
Potassium: 3.2 mmol/L — ABNORMAL LOW (ref 3.5–5.1)
Sodium: 136 mmol/L (ref 135–145)

## 2022-09-18 LAB — GLUCOSE, CAPILLARY
Glucose-Capillary: 174 mg/dL — ABNORMAL HIGH (ref 70–99)
Glucose-Capillary: 72 mg/dL (ref 70–99)
Glucose-Capillary: 75 mg/dL (ref 70–99)
Glucose-Capillary: 82 mg/dL (ref 70–99)
Glucose-Capillary: 87 mg/dL (ref 70–99)
Glucose-Capillary: 97 mg/dL (ref 70–99)

## 2022-09-18 LAB — CBC
HCT: 37.1 % (ref 36.0–46.0)
HCT: 37.6 % (ref 36.0–46.0)
Hemoglobin: 12.2 g/dL (ref 12.0–15.0)
Hemoglobin: 12.4 g/dL (ref 12.0–15.0)
MCH: 30.6 pg (ref 26.0–34.0)
MCH: 30.5 pg (ref 26.0–34.0)
MCHC: 32.9 g/dL (ref 30.0–36.0)
MCHC: 33 g/dL (ref 30.0–36.0)
MCV: 93 fL (ref 80.0–100.0)
MCV: 92.6 fL (ref 80.0–100.0)
Platelets: 263 10*3/uL (ref 150–400)
Platelets: 242 10*3/uL (ref 150–400)
RBC: 3.99 MIL/uL (ref 3.87–5.11)
RBC: 4.06 MIL/uL (ref 3.87–5.11)
RDW: 13.2 % (ref 11.5–15.5)
RDW: 13.1 % (ref 11.5–15.5)
WBC: 5.3 10*3/uL (ref 4.0–10.5)
WBC: 5.2 10*3/uL (ref 4.0–10.5)
nRBC: 0 % (ref 0.0–0.2)
nRBC: 0 % (ref 0.0–0.2)

## 2022-09-18 LAB — HEPARIN LEVEL (UNFRACTIONATED): Heparin Unfractionated: 0.41 IU/mL (ref 0.30–0.70)

## 2022-09-18 MED ORDER — FLUTICASONE PROPIONATE 50 MCG/ACT NA SUSP
2.0000 | Freq: Every day | NASAL | Status: DC
  Administered 2022-09-18 – 2022-09-20 (×3): 2 via NASAL
  Filled 2022-09-18: qty 16

## 2022-09-18 MED ORDER — POTASSIUM CHLORIDE 10 MEQ/100ML IV SOLN
10.0000 meq | INTRAVENOUS | Status: DC
  Administered 2022-09-18 (×2): 10 meq via INTRAVENOUS
  Filled 2022-09-18 (×2): qty 100

## 2022-09-18 MED ORDER — APIXABAN 5 MG PO TABS
5.0000 mg | ORAL_TABLET | Freq: Two times a day (BID) | ORAL | Status: DC
  Administered 2022-09-18 – 2022-09-20 (×5): 5 mg via ORAL
  Filled 2022-09-18 (×5): qty 1

## 2022-09-18 MED ORDER — ACETAMINOPHEN 500 MG PO TABS
1000.0000 mg | ORAL_TABLET | Freq: Four times a day (QID) | ORAL | Status: DC
  Administered 2022-09-18 – 2022-09-20 (×8): 1000 mg via ORAL
  Filled 2022-09-18 (×9): qty 2

## 2022-09-18 MED ORDER — METOPROLOL SUCCINATE ER 25 MG PO TB24
12.5000 mg | ORAL_TABLET | Freq: Every day | ORAL | Status: DC
  Administered 2022-09-18 – 2022-09-19 (×2): 12.5 mg via ORAL
  Filled 2022-09-18 (×3): qty 1

## 2022-09-18 MED ORDER — PANTOPRAZOLE SODIUM 40 MG PO TBEC
40.0000 mg | DELAYED_RELEASE_TABLET | Freq: Every day | ORAL | Status: DC
  Administered 2022-09-18 – 2022-09-20 (×3): 40 mg via ORAL
  Filled 2022-09-18 (×3): qty 1

## 2022-09-18 MED ORDER — POTASSIUM CHLORIDE CRYS ER 20 MEQ PO TBCR
40.0000 meq | EXTENDED_RELEASE_TABLET | Freq: Once | ORAL | Status: AC
  Administered 2022-09-18: 40 meq via ORAL
  Filled 2022-09-18: qty 2

## 2022-09-18 MED ORDER — HYDROMORPHONE HCL 1 MG/ML IJ SOLN: 1.0000 mg | INTRAMUSCULAR | Status: DC | PRN

## 2022-09-18 MED ORDER — B COMPLEX-C PO TABS
1.0000 | ORAL_TABLET | Freq: Every day | ORAL | Status: DC
  Administered 2022-09-18 – 2022-09-20 (×3): 1 via ORAL
  Filled 2022-09-18 (×3): qty 1

## 2022-09-18 MED ORDER — METHOCARBAMOL 500 MG PO TABS
500.0000 mg | ORAL_TABLET | Freq: Four times a day (QID) | ORAL | Status: DC
  Administered 2022-09-18 – 2022-09-20 (×9): 500 mg via ORAL
  Filled 2022-09-18 (×9): qty 1

## 2022-09-18 MED ORDER — ADULT MULTIVITAMIN W/MINERALS CH
1.0000 | ORAL_TABLET | Freq: Every day | ORAL | Status: DC
  Administered 2022-09-18 – 2022-09-20 (×3): 1 via ORAL
  Filled 2022-09-18 (×3): qty 1

## 2022-09-18 MED ORDER — VITAMIN C 500 MG PO TABS
1000.0000 mg | ORAL_TABLET | Freq: Every day | ORAL | Status: DC
  Administered 2022-09-18 – 2022-09-20 (×3): 1000 mg via ORAL
  Filled 2022-09-18 (×3): qty 2

## 2022-09-18 NOTE — Progress Notes (Signed)
    4 Days Post-Op  Subjective: CC: Doing well. Some soreness around her incision. Well controlled but still using IV pain medication. No utilizing po oxy. Parameters changed. Tolerating cld without n/v. Passing flatus. Voiding. Mobilizing.   Afebrile. No tachycardia or hypotension. WBC wnl. Hgb stable at 12.4.   Objective: Vital signs in last 24 hours: Temp:  [98.4 F (36.9 C)-99.3 F (37.4 C)] 98.4 F (36.9 C) (02/23 0357) Pulse Rate:  [75-95] 85 (02/23 0357) Resp:  [14-18] 14 (02/23 0357) BP: (109-127)/(78-96) 127/96 (02/23 0357) SpO2:  [99 %-100 %] 99 % (02/23 0357) Last BM Date : 09/16/22  Intake/Output from previous day: 02/22 0701 - 02/23 0700 In: 546.6 [P.O.:238; I.V.:308.6] Out: -  Intake/Output this shift: No intake/output data recorded.  PE: Gen:  Alert, NAD, pleasant Abd: Soft, mild distension, appropriately tender around midline incisions, no rigidity or guarding and otherwise NT, + BS. Midline incision with glue intact appears well and are without drainage, bleeding, or signs of infection Psych: A&Ox3   Lab Results:  Recent Labs    09/18/22 0519 09/18/22 0802  WBC 5.3 5.2  HGB 12.2 12.4  HCT 37.1 37.6  PLT 263 242    BMET Recent Labs    09/17/22 0839 09/18/22 0519  NA 136 136  K 3.8 3.2*  CL 99 99  CO2 26 25  GLUCOSE 81 84  BUN 13 16  CREATININE 0.71 0.61  CALCIUM 8.5* 8.7*    PT/INR No results for input(s): "LABPROT", "INR" in the last 72 hours. CMP     Component Value Date/Time   NA 136 09/18/2022 0519   K 3.2 (L) 09/18/2022 0519   CL 99 09/18/2022 0519   CO2 25 09/18/2022 0519   GLUCOSE 84 09/18/2022 0519   GLUCOSE 107 (H) 07/13/2006 1017   BUN 16 09/18/2022 0519   CREATININE 0.61 09/18/2022 0519   CREATININE 0.87 01/19/2020 1443   CALCIUM 8.7 (L) 09/18/2022 0519   PROT 7.3 09/13/2022 0543   ALBUMIN 4.2 09/13/2022 0543   AST 27 09/13/2022 0543   ALT 21 09/13/2022 0543   ALKPHOS 60 09/13/2022 0543   BILITOT 1.7 (H)  09/13/2022 0543   GFRNONAA >60 09/18/2022 0519   GFRNONAA 82 08/16/2015 1143   GFRAA >60 08/09/2018 0748   GFRAA >89 08/16/2015 1143   Lipase     Component Value Date/Time   LIPASE 31 09/12/2022 0405    Studies/Results: No results found.  Anti-infectives: Anti-infectives (From admission, onward)    Start     Dose/Rate Route Frequency Ordered Stop   09/14/22 1200  cefoTEtan (CEFOTAN) 2 g in sodium chloride 0.9 % 100 mL IVPB        2 g 200 mL/hr over 30 Minutes Intravenous On call to O.R. 09/14/22 1109 09/14/22 1412        Assessment/Plan POD 4 s/p Exploratory laparotomy, lysis of adhesions for SBO by Dr. Barry Dienes on 09/14/22 - Adv to [place]. May be able to try soft diet this pm.  - Maximize po pain control options.  - Mobilize - Pulm toilet  FEN - FLD. IVF per TRH.   VTE - SCDs, heparin gtt ID - Cefotetan peri-op. None currently.  Foley - None, voiding. Plan - As above.   PAF on Eliquis (last dose 2/16) DM2 HTN HLD   LOS: 5 days    Jillyn Ledger , [facility] 09/18/2022, 9:29 AM Please see Amion for pager number during day hours 7:00am-4:30pm

## 2022-09-18 NOTE — Discharge Instructions (Signed)
[facility]      [facility] [phone]  OPEN ABDOMINAL SURGERY: POST OP INSTRUCTIONS  Always review your discharge instruction sheet given to you by the facility where your surgery was performed.  IF YOU HAVE DISABILITY OR FAMILY LEAVE FORMS, YOU MUST BRING THEM TO THE OFFICE FOR PROCESSING.  PLEASE DO NOT GIVE THEM TO YOUR DOCTOR.  A prescription for pain medication may be given to you upon discharge.  Take your pain medication as prescribed, if needed.  If narcotic pain medicine is not needed, then you may take acetaminophen (Tylenol) or ibuprofen (Advil) as needed. Take your usually prescribed medications unless otherwise directed. If you need a refill on your pain medication, please contact your pharmacy. They will contact our office to request authorization.  Prescriptions will not be filled after 5pm or on week-ends. You should follow a light diet the first few days after arrival home, such as soup and crackers, pudding, etc.unless your doctor has advised otherwise. A high-fiber, low fat diet can be resumed as tolerated.   Be sure to include lots of fluids daily. Most patients will experience some swelling and bruising on the chest and neck area.  Ice packs will help.  Swelling and bruising can take several days to resolve Most patients will experience some swelling and bruising in the area of the incision. Ice pack will help. Swelling and bruising can take several days to resolve..  It is common to experience some constipation if taking pain medication after surgery.  Increasing fluid intake and taking a stool softener will usually help or prevent this problem from occurring.  A mild laxative (Milk of Magnesia or Miralax) should be taken according to package directions if there are no bowel movements after 48 hours.  You may have steri-strips (small skin tapes) in place directly over the incision.  These strips should be left on the skin for 7-10 days.  If your surgeon used skin  glue on the incision, you may shower in 24 hours.  The glue will flake off over the next 2-3 weeks.  Any sutures or staples will be removed at the office during your follow-up visit. You may find that a light gauze bandage over your incision may keep your staples from being rubbed or pulled. You may shower and replace the bandage daily. ACTIVITIES:  You may resume regular (light) daily activities beginning the next day--such as daily self-care, walking, climbing stairs--gradually increasing activities as tolerated.  You may have sexual intercourse when it is comfortable.  Refrain from any heavy lifting or straining until approved by your doctor. You may drive when you no longer are taking prescription pain medication, you can comfortably wear a seatbelt, and you can safely maneuver your car and apply brakes Return to Work: ___________________________________ You should see your doctor in the office for a follow-up appointment approximately two weeks after your surgery.  Make sure that you call for this appointment within a day or two after you arrive home to insure a convenient appointment time. OTHER INSTRUCTIONS:  _____________________________________________________________ _____________________________________________________________  WHEN TO CALL YOUR DOCTOR: Fever over 101.0 Inability to urinate Nausea and/or vomiting Extreme swelling or bruising Continued bleeding from incision. Increased pain, redness, or drainage from the incision. Difficulty swallowing or breathing Muscle cramping or spasms. Numbness or tingling in hands or feet or around lips.  The clinic staff is available to answer your questions during regular business hours.  Please don't hesitate to call and ask to speak to one of   the nurses if you have concerns.  For further questions, please visit [email or web address]  

## 2022-09-18 NOTE — Progress Notes (Signed)
PROGRESS NOTE    Adriana Collins  [id number] DOB: 01-Sep-1950 DOA: 09/12/2022 PCP: [name], DO     Brief Narrative:  Patient is a 72 years old female with past medical history of hypertension, hyperlipidemia, type 2 diabetes, paroxysmal atrial fibrillation presented to hospital with acute abdominal pain, nausea, vomiting for 4 hours prior to presentation.  Patient does have history of appendectomy in the past.  CT scan of the abdomen pelvis done in the ED showed  partial or early bowel obstruction with transition point in the right lower quadrant in the area of the prior appendectomy.  Patient was initially treated conservatively but did not improve so underwent surgical intervention 09/14/2022.  Assessment & Plan:  Principal Problem:   SBO (small bowel obstruction) (HCC) Active Problems:   Hyperlipidemia with target LDL less than 70   Obesity (BMI 30)   Hyperlipidemia associated with type 2 diabetes mellitus ([place])   Uncontrolled type 2 diabetes mellitus with hyperglycemia (HCC)   Paroxysmal atrial fibrillation (HCC)   Chronic anticoagulation   Hypokalemia  Small bowel obstruction status post exploratory laparotomy on 09/14/2022 Patient remained symptomatic so underwent surgical intervention with laparotomy and lysis of adhesions on 09/14/2022. Has not had bowel movement but no nausea vomiting and has been passing flatus.  Seen by general surgery.  Has tolerated clears.  Patient has been advanced on full liquids today.    Hypokalemia Will replenish potassium IV and orally.  Check levels in AM.  Potassium of 3.2.  Paroxysmal atrial fibrillation. Will resume Eliquis.  Discontinue heparin drip.  Hemoglobin has been stable.  Continue metoprolol p.o.   Diabetes mellitus type 2. Hemoglobin A1c 5.6.  Continue IV fluids.   Latest POC glucose of 127   DVT prophylaxis: SCDs Start: 09/12/22 1300   Code Status:   Code Status: Full Code  Family Communication:   Spoke with husband at bedside on 09/17/22   Disposition:   Dispo: The patient is from: Home              Anticipated d/c is to: Home              Anticipated d/c date is: Likely in 1 to 2 days when okay from surgical point of view.    Antimicrobials:   Anti-infectives (From admission, onward)    Start     Dose/Rate Route Frequency Ordered Stop   09/14/22 1200  cefoTEtan (CEFOTAN) 2 g in sodium chloride 0.9 % 100 mL IVPB        2 g 200 mL/hr over 30 Minutes Intravenous On call to O.R. 09/14/22 1109 09/14/22 1412       Subjective:  Patient was seen and examined at bedside.  Complains of less sore throat.  No nausea vomiting or abdominal pain but has been passing gas.  Has been burping as well.  Has not had a bowel movement yet but has tolerated clears.    Objective: Vitals:   09/17/22 1349 09/17/22 2042 09/17/22 2338 09/18/22 0357  BP: 109/78 119/78 116/83 (!) 127/96  Pulse: 95 88 75 85  Resp: '18 18  14  '$ Temp: 98.6 F (37 C) 99.3 F (37.4 C)  98.4 F (36.9 C)  TempSrc: Oral Oral  Oral  SpO2: 100% 100%  99%  Weight:      Height:        Intake/Output Summary (Last 24 hours) at 09/18/2022 1116 Last data filed at 09/18/2022 0357 Gross per 24 hour  Intake 546.57 ml  Output --  Net 546.57 ml    Filed Weights   09/12/22 0213 09/14/22 1222 09/14/22 1237  Weight: 79.4 kg 79 kg 79 kg    Physical examination: Body mass index is 28.98 kg/m.   General: Alert awake and Communicative, not in obvious distress. HENT:   No scleral pallor or icterus noted. Oral mucosa is moist.  Chest:  Clear breath sounds.  No crackles or wheezes.  CVS: S1 &S2 heard. No murmur.  Regular rate and rhythm. Abdomen:, Nontender, bowel sounds are present, surgical incision clean dry and intact. Extremities: No cyanosis, clubbing or edema.  Peripheral pulses are palpable. Psych: Alert, awake and oriented, normal mood CNS:  No cranial nerve deficits.    Data Reviewed: I have personally reviewed   labs and visualized  imaging studies    Scheduled Meds:  acetaminophen  1,000 mg Oral Q6H   Chlorhexidine Gluconate Cloth  6 each Topical Q0600   insulin aspart  0-15 Units Subcutaneous Q4H   methocarbamol  500 mg Oral QID   metoprolol tartrate  5 mg Intravenous Q8H   mupirocin ointment  1 Application Nasal BID   mupirocin ointment  1 Application Nasal BID   pantoprazole (PROTONIX) IV  40 mg Intravenous Q24H   timolol  1 drop Right Eye BID   Continuous Infusions:  heparin 850 Units/hr (09/17/22 1653)   potassium chloride 10 mEq (09/18/22 1001)     LOS: 5 days   Flora Lipps, MD  [facility] 09/18/2022, 11:16 AM

## 2022-09-18 NOTE — Progress Notes (Signed)
ANTICOAGULATION CONSULT NOTE - Follow Up Consult  Pharmacy Consult for Heparin Indication: atrial fibrillation, apixaban held  Allergies  Allergen Reactions   Adhesive [Tape] Other (See Comments)    BandAids   Amoxil [Amoxicillin] Other (See Comments)    Loopy feeling   Azor [Amlodipine-Olmesartan] Other (See Comments)    Pain    Bystolic [Nebivolol Hcl] Itching   Crestor [Rosuvastatin] Other (See Comments)    Pain    Diltiazem Nausea And Vomiting   Ezetimibe Other (See Comments)    Causes heart to race.   Lipitor [Atorvastatin] Other (See Comments)    Pain    Lisinopril Cough   Milk-Related Compounds Other (See Comments)    Lactose intolerance   Prednisone Other (See Comments)    Elevated BP   Sulfonamide Derivatives     REACTION: HIVES   Diovan [Valsartan] Palpitations    Patient Measurements: Height: '5\' 5"'$  (165.1 cm) Weight: 79 kg (174 lb 2.6 oz) IBW/kg (Calculated) : 57 Heparin Dosing Weight: 73.5 kg  Vital Signs: Temp: 98.4 F (36.9 C) (02/23 0357) Temp Source: Oral (02/23 0357) BP: 127/96 (02/23 0357) Pulse Rate: 85 (02/23 0357)  Labs: Recent Labs    09/15/22 1417 09/15/22 2120 09/15/22 2120 09/16/22 0539 09/16/22 1421 09/16/22 2323 09/17/22 0839 09/18/22 0519  HGB  --   --    < > 12.8  --   --  12.9 12.2  HCT  --   --   --  39.8  --   --  39.0 37.1  PLT  --   --   --  264  --   --  262 263  APTT 28 73*  --   --   --   --   --   --   HEPARINUNFRC 0.14* 0.53  --  0.79* 0.72* 0.65 0.52  --   CREATININE  --   --   --  0.87  --   --  0.71 0.61   < > = values in this interval not displayed.     Estimated Creatinine Clearance: 68 mL/min (by C-G formula based on SCr of 0.61 mg/dL).   Medications:  Scheduled:   Chlorhexidine Gluconate Cloth  6 each Topical Q0600   insulin aspart  0-15 Units Subcutaneous Q4H   metoprolol tartrate  5 mg Intravenous Q8H   mupirocin ointment  1 Application Nasal BID   mupirocin ointment  1 Application Nasal BID    pantoprazole (PROTONIX) IV  40 mg Intravenous Q24H   timolol  1 drop Right Eye BID   Infusions:   heparin 850 Units/hr (09/17/22 1653)    Assessment: 76 yoF admitted on 2/17 with abdominal pain, found to have SBO s/p  Exploratory laparotomy, lysis of adhesions on 2/19.  PMH is significant for Afib on Eliquis that was held for surgery.  Her last dose was on 2/16 at 1500.  Inpatient she was on started on Lovenox 2/17-2/18 with most recent dose on 2/18 at 1700.  [facility] says OK to anticoagulate with heparin, no bolus.  Pharmacy consulted for heparin dosing 2/20.    Today, 09/18/2022:  - Heparin level 0.41, therapeutic on heparin 850 units/hr - CBC: remains WNL - No bleeding or complications reported - Awaiting GI function, ability to tolerate PO.  NG tube removed 2/22 and advanced to [facility]D.    Goal of Therapy:  Heparin level 0.3-0.7 units/ml Monitor platelets by anticoagulation protocol: Yes   Plan:  Continue heparin IV infusion at 850 units/hr Daily  heparin level and CBC Follow up procedural plans vs ability to resume oral anticoagulant.    Gretta Arab PharmD, BCPS [facility] main pharmacy [phone] 09/18/2022 7:10 AM

## 2022-09-19 DIAGNOSIS — Z7901 Long term (current) use of anticoagulants: Secondary | ICD-10-CM | POA: Diagnosis not present

## 2022-09-19 DIAGNOSIS — E1169 Type 2 diabetes mellitus with other specified complication: Secondary | ICD-10-CM | POA: Diagnosis not present

## 2022-09-19 DIAGNOSIS — E785 Hyperlipidemia, unspecified: Secondary | ICD-10-CM | POA: Diagnosis not present

## 2022-09-19 DIAGNOSIS — K56609 Unspecified intestinal obstruction, unspecified as to partial versus complete obstruction: Secondary | ICD-10-CM | POA: Diagnosis not present

## 2022-09-19 LAB — GLUCOSE, CAPILLARY
Glucose-Capillary: 127 mg/dL — ABNORMAL HIGH (ref 70–99)
Glucose-Capillary: 191 mg/dL — ABNORMAL HIGH (ref 70–99)
Glucose-Capillary: 68 mg/dL — ABNORMAL LOW (ref 70–99)
Glucose-Capillary: 77 mg/dL (ref 70–99)
Glucose-Capillary: 92 mg/dL (ref 70–99)
Glucose-Capillary: 96 mg/dL (ref 70–99)

## 2022-09-19 LAB — CBC
HCT: 35.8 % — ABNORMAL LOW (ref 36.0–46.0)
Hemoglobin: 11.8 g/dL — ABNORMAL LOW (ref 12.0–15.0)
MCH: 30.6 pg (ref 26.0–34.0)
MCHC: 33 g/dL (ref 30.0–36.0)
MCV: 92.7 fL (ref 80.0–100.0)
Platelets: 258 10*3/uL (ref 150–400)
RBC: 3.86 MIL/uL — ABNORMAL LOW (ref 3.87–5.11)
RDW: 13.4 % (ref 11.5–15.5)
WBC: 4.7 10*3/uL (ref 4.0–10.5)
nRBC: 0 % (ref 0.0–0.2)

## 2022-09-19 LAB — BASIC METABOLIC PANEL
Anion gap: 10 (ref 5–15)
BUN: 13 mg/dL (ref 8–23)
CO2: 22 mmol/L (ref 22–32)
Calcium: 8.7 mg/dL — ABNORMAL LOW (ref 8.9–10.3)
Chloride: 99 mmol/L (ref 98–111)
Creatinine, Ser: 0.82 mg/dL (ref 0.44–1.00)
GFR, Estimated: >60 mL/min (ref >60)
Glucose, Bld: 118 mg/dL — ABNORMAL HIGH (ref 70–99)
Potassium: 3.7 mmol/L (ref 3.5–5.1)
Sodium: 131 mmol/L — ABNORMAL LOW (ref 135–145)

## 2022-09-19 LAB — MAGNESIUM: Magnesium: 2 mg/dL (ref 1.7–2.4)

## 2022-09-19 NOTE — Progress Notes (Signed)
PROGRESS NOTE    Adriana Collins  [id number] DOB: [birth year] DOA: 09/12/2022 PCP: [name], DO     Brief Narrative:  Patient is a 72 years old female with past medical history of hypertension, hyperlipidemia, type 2 diabetes, paroxysmal atrial fibrillation presented to hospital with acute abdominal pain, nausea, vomiting for 4 hours prior to presentation.  Patient does have history of appendectomy in the past.  CT scan of the abdomen pelvis done in the ED showed  partial or early bowel obstruction with transition point in the right lower quadrant in the area of the prior appendectomy.  Patient was initially treated conservatively but did not improve so underwent surgical intervention 09/14/2022.  At this time patient has been gradually improving on oral diet.  Assessment & Plan:  Principal Problem:   SBO (small bowel obstruction) (HCC) Active Problems:   Hyperlipidemia with target LDL less than 70   Obesity (BMI 30)   Hyperlipidemia associated with type 2 diabetes mellitus ([place])   Uncontrolled type 2 diabetes mellitus with hyperglycemia (HCC)   Paroxysmal atrial fibrillation (HCC)   Chronic anticoagulation   Hypokalemia  Small bowel obstruction status post exploratory laparotomy on 09/14/2022 Patient remained symptomatic so underwent surgical intervention with laparotomy and lysis of adhesions on 09/14/2022.  Feels better.  Has had bowel movements and no nausea vomiting or overt pain.  Surgery on board.  On soft diet.  Recommend advancing to regular today and hopefully disposition home tomorrow.  No leukocytosis.  Hypokalemia Improved after replacement.  Paroxysmal atrial fibrillation. Continue Eliquis and metoprolol.  Hemoglobin has remained stable.  Diabetes mellitus type 2. Hemoglobin A1c 5.6.    Latest POC glucose of 127.  On sliding scale insulin.  Diet controlled at home.  Mild hyponatremia.  Will monitor BMP in AM.   DVT prophylaxis: SCDs Start:  09/12/22 1300 apixaban (ELIQUIS) tablet 5 mg   Code Status:   Code Status: Full Code  Family Communication:  Spoke with husband at bedside on 09/17/22   Disposition:   Dispo: The patient is from: Home              Anticipated d/c is to: Home              Anticipated d/c date is: Likely on 09/20/2022 if okay with surgery.  Antimicrobials:   Anti-infectives (From admission, onward)    Start     Dose/Rate Route Frequency Ordered Stop   09/14/22 1200  cefoTEtan (CEFOTAN) 2 g in sodium chloride 0.9 % 100 mL IVPB        2 g 200 mL/hr over 30 Minutes Intravenous On call to O.R. 09/14/22 1109 09/14/22 1412       Subjective: Today, patient was seen and examined at bedside.  Continues to feel better.  Had bowel movements no nausea vomiting.  Tolerated soft diet.  Objective: Vitals:   09/18/22 1140 09/18/22 1246 09/18/22 1935 09/19/22 0411  BP: 118/81 112/89 114/82 110/79  Pulse: 89 (!) 57 62 (!) 57  Resp: '19  18 18  '$ Temp: 98.6 F (37 C) 98.1 F (36.7 C) 97.8 F (36.6 C)   TempSrc: Oral Oral Oral   SpO2: 98% 100% 99% 97%  Weight:      Height:        Intake/Output Summary (Last 24 hours) at 09/19/2022 1047 Last data filed at 09/19/2022 0830 Gross per 24 hour  Intake 460 ml  Output --  Net 460 ml    [employer]  09/12/22 0213 09/14/22 1222 09/14/22 1237  Weight: 79.4 kg 79 kg 79 kg    Physical examination: Body mass index is 28.98 kg/m.   General: Alert awake and Communicative, not in obvious distress. HENT:   No scleral pallor or icterus noted. Oral mucosa is moist.  Chest:  Clear breath sounds.  No crackles or wheezes.  CVS: S1 &S2 heard. No murmur.  Regular rate and rhythm. Abdomen:, Soft, bowel sounds present, surgical incision clean dry and intact.  Appropriately tender on palpation. Extremities: No cyanosis, clubbing or edema.  Peripheral pulses are palpable. Psych: Alert, awake and oriented, normal mood CNS:  No cranial nerve deficits.    Data Reviewed:  I have personally reviewed  labs and  imaging studies    Scheduled Meds:  acetaminophen  1,000 mg Oral Q6H   apixaban  5 mg Oral BID   vitamin C  1,000 mg Oral Daily   B-complex with vitamin C  1 tablet Oral Daily   Chlorhexidine Gluconate Cloth  6 each Topical Q0600   fluticasone  2 spray Each Nare Daily   insulin aspart  0-15 Units Subcutaneous Q4H   methocarbamol  500 mg Oral QID   metoprolol succinate  12.5 mg Oral Daily   multivitamin with minerals  1 tablet Oral Daily   mupirocin ointment  1 Application Nasal BID   mupirocin ointment  1 Application Nasal BID   pantoprazole  40 mg Oral Daily   timolol  1 drop Right Eye BID   Continuous Infusions:     LOS: 6 days   Flora Lipps, MD  [facility] 09/19/2022, 10:47 AM

## 2022-09-19 NOTE — Progress Notes (Signed)
5 Days Post-Op   Subjective/Chief Complaint: Feels good. No complaints. Reports some diarrhea   Objective: Vital signs in last 24 hours: Temp:  [97.8 F (36.6 C)-98.6 F (37 C)] 97.8 F (36.6 C) (02/23 1935) Pulse Rate:  [57-89] 57 (02/24 0411) Resp:  [18-19] 18 (02/24 0411) BP: (110-118)/(79-89) 110/79 (02/24 0411) SpO2:  [97 %-100 %] 97 % (02/24 0411) Last BM Date : 09/11/22  Intake/Output from previous day: 02/23 0701 - 02/24 0700 In: 240 [P.O.:240] Out: -  Intake/Output this shift: No intake/output data recorded.  General appearance: alert and cooperative Resp: clear to auscultation bilaterally Cardio: regular rate and rhythm GI: soft, mild tenderness. Incision looks good  Lab Results:  Recent Labs    09/18/22 0802 09/19/22 0643  WBC 5.2 4.7  HGB 12.4 11.8*  HCT 37.6 35.8*  PLT 242 258   BMET Recent Labs    09/18/22 0519 09/19/22 0643  NA 136 131*  K 3.2* 3.7  CL 99 99  CO2 25 22  GLUCOSE 84 118*  BUN 16 13  CREATININE 0.61 0.82  CALCIUM 8.7* 8.7*   PT/INR No results for input(s): "LABPROT", "INR" in the last 72 hours. ABG No results for input(s): "PHART", "HCO3" in the last 72 hours.  Invalid input(s): "PCO2", "PO2"  Studies/Results: No results found.  Anti-infectives: Anti-infectives (From admission, onward)    Start     Dose/Rate Route Frequency Ordered Stop   09/14/22 1200  cefoTEtan (CEFOTAN) 2 g in sodium chloride 0.9 % 100 mL IVPB        2 g 200 mL/hr over 30 Minutes Intravenous On call to O.R. 09/14/22 1109 09/14/22 1412       Assessment/Plan: s/p Procedure(s): EXPLORATORY LAPAROTOMY, LYSIS OF ADHESIONS (N/A) Advance diet to regular Ambulate If she continues to do well then plan for d/c tomorrow POD 4 s/p Exploratory laparotomy, lysis of adhesions for SBO by Dr. Barry Dienes on 09/14/22 - Adv to regular.  - Maximize po pain control options.  - Mobilize - Pulm toilet   FEN - FLD. IVF per TR[facility].   VTE - SCDs, heparin gtt ID -  Cefotetan peri-op. None currently.  Foley - None, voiding. Plan - As above.    PAF on Eliquis (last dose 2/16). May restart at any time now DM2 HTN HLD  LOS: 6 days    Autumn Messing III 09/19/2022

## 2022-09-20 DIAGNOSIS — K56609 Unspecified intestinal obstruction, unspecified as to partial versus complete obstruction: Secondary | ICD-10-CM | POA: Diagnosis not present

## 2022-09-20 DIAGNOSIS — E1169 Type 2 diabetes mellitus with other specified complication: Secondary | ICD-10-CM | POA: Diagnosis not present

## 2022-09-20 DIAGNOSIS — Z7901 Long term (current) use of anticoagulants: Secondary | ICD-10-CM | POA: Diagnosis not present

## 2022-09-20 DIAGNOSIS — E785 Hyperlipidemia, unspecified: Secondary | ICD-10-CM | POA: Diagnosis not present

## 2022-09-20 LAB — GLUCOSE, CAPILLARY
Glucose-Capillary: 106 mg/dL — ABNORMAL HIGH (ref 70–99)
Glucose-Capillary: 76 mg/dL (ref 70–99)
Glucose-Capillary: 85 mg/dL (ref 70–99)

## 2022-09-20 MED ORDER — OXYCODONE HCL 5 MG PO TABS: 5.0000 mg | ORAL_TABLET | Freq: Four times a day (QID) | ORAL | 0 refills | Status: DC | PRN

## 2022-09-20 MED ORDER — ONDANSETRON HCL 4 MG PO TABS: 4.0000 mg | ORAL_TABLET | Freq: Four times a day (QID) | ORAL | 0 refills | Status: DC | PRN

## 2022-09-20 NOTE — Progress Notes (Signed)
Pt discharged to home. DC instructions given with husband at bedside. No concerns voiced. Pt encouraged to stop by pharmacy and pick up meds that were electronically ordered by MD. Voiced understanding. Left unit in wheelchair pushed by [place], NT. Left in stable condition.

## 2022-09-20 NOTE — Discharge Summary (Signed)
Physician Discharge Summary  Adriana Collins [id number] DOB: 08-Jun-1951 DOA: 09/12/2022  PCP: [name], DO  Admit date: 09/12/2022 Discharge date: 09/20/2022  Admitted From: Home  Discharge disposition: Home   Recommendations for Outpatient Follow-Up:   Follow up with your primary care provider in one week.  Check CBC, BMP, magnesium in the next visit Follow-up with general surgery as has been scheduled on 10/12/2022.  Discharge Diagnosis:   Principal Problem:   SBO (small bowel obstruction) (HCC) Active Problems:   Hyperlipidemia with target LDL less than 70   Obesity (BMI 30)   Hyperlipidemia associated with type 2 diabetes mellitus ([place])   Uncontrolled type 2 diabetes mellitus with hyperglycemia (HCC)   Paroxysmal atrial fibrillation (HCC)   Chronic anticoagulation   Hypokalemia   Discharge Condition: Improved.  Diet recommendation:  Regular.  Wound care: None.  Code status: Full.   History of Present Illness:   Patient is a 72 years old female with past medical history of hypertension, hyperlipidemia, type 2 diabetes, paroxysmal atrial fibrillation presented to hospital with acute abdominal pain, nausea, vomiting for 4 hours prior to presentation.  Patient does have history of appendectomy in the past.  CT scan of the abdomen pelvis done in the ED showed  partial or early bowel obstruction with transition point in the right lower quadrant in the area of the prior appendectomy.  Patient was initially treated conservatively but did not improve so underwent surgical intervention 09/14/2022.   Hospital Course:   Following conditions were addressed during hospitalization as listed below,  Small bowel obstruction status post exploratory laparotomy on 09/14/2022 Patient remained symptomatic despite conservative treatment so underwent surgical intervention with laparotomy and lysis of adhesions on 09/14/2022.  Operatively patient has done well.  Has  had bowel movements and no nausea vomiting or overt pain.  At this time patient has tolerated oral diet.  Patient will follow-up with general surgery as outpatient after discharge.  Hypokalemia Improved after replacement.  Latest potassium was 3.7.   Paroxysmal atrial fibrillation. Continue Eliquis and metoprolol as outpatient..  Hemoglobin has remained stable.   Diabetes mellitus type 2. Hemoglobin A1c 5.6.      Diet controlled at home.   Mild hyponatremia.  Mild.  Will need outpatient monitoring.   Disposition.  At this time, patient is stable for disposition home with outpatient PCP and general surgery follow-up.  Medical Consultants:   General surgery.  Procedures:    Exploratory laparotomy on 09/14/2012 with lysis of adhesions. Subjective:   Today, patient was seen and examined at bedside.  Denies any nausea vomiting abdominal pain.  Has had bowel movements.  Has tolerated oral diet.  Discharge Exam:   Vitals:   09/20/22 0411 09/20/22 0850  BP: 108/74 114/79  Pulse: (!) 57 (!) 54  Resp: 17   Temp: 98.4 F (36.9 C)   SpO2: 97% 100%   Vitals:   09/19/22 1454 09/19/22 1914 09/20/22 0411 09/20/22 0850  BP: 99/67 102/72 108/74 114/79  Pulse: (!) 57 (!) 55 (!) 57 (!) 54  Resp: '16 17 17   '$ Temp: 98.5 F (36.9 C) 98.2 F (36.8 C) 98.4 F (36.9 C)   TempSrc: Oral Oral Oral   SpO2: 100% 100% 97% 100%  Weight:      Height:       General: Alert awake, not in obvious distress HENT: pupils equally reacting to light,  No scleral pallor or icterus noted. Oral mucosa is moist.  Chest:  Clear  breath sounds.  Diminished breath sounds bilaterally. No crackles or wheezes.  CVS: S1 &S2 heard. No murmur.  Regular rate and rhythm. Abdomen: Soft, nontender, nondistended.  Bowel sounds are heard.  Clean dry and intact abdominal incision. Extremities: No cyanosis, clubbing or edema.  Peripheral pulses are palpable. Psych: Alert, awake and oriented, normal mood CNS:  No cranial  nerve deficits.  Power equal in all extremities.   Skin: Warm and dry.  No rashes noted.  The results of significant diagnostics from this hospitalization (including imaging, microbiology, ancillary and laboratory) are listed below for reference.     Diagnostic Studies:   DG Abd 1 View  Result Date: 09/13/2022 CLINICAL DATA:  Nasogastric tube placement. EXAM: ABDOMEN - 1 VIEW COMPARISON:  09/13/2022 at 12:06 a.m. FINDINGS: Nasogastric tube extends below diaphragm, into the mid to distal stomach. Residual contrast is noted in the gastric fundus. IMPRESSION: Well-positioned nasogastric tube. Electronically Signed   By: Lajean Manes M.D.   On: 09/13/2022 12:42   DG Abd Portable 1V-Small Bowel Obstruction Protocol-initial, 8 hr delay  Result Date: 09/13/2022 CLINICAL DATA:  8 hour small-bowel follow-up film EXAM: PORTABLE ABDOMEN - 1 VIEW COMPARISON:  CT from the previous day. FINDINGS: Administered contrast lies predominately within the stomach although some contrast is now seen within the small bowel. Persistent dilated loops in the right mid abdomen are noted. No definitive colonic contrast is noted at this time. IMPRESSION: Contrast within the dilated small bowel although not within the colon. 24 hour follow-up film is recommended. Electronically Signed   By: Inez [place] M.D.   On: 09/13/2022 00:30   CT ABDOMEN PELVIS W CONTRAST  Result Date: 09/12/2022 CLINICAL DATA:  Acute and nonlocalized abdominal pain. Nausea and vomiting EXAM: CT ABDOMEN AND PELVIS WITH CONTRAST TECHNIQUE: Multidetector CT imaging of the abdomen and pelvis was performed using the standard protocol following bolus administration of intravenous contrast. RADIATION DOSE REDUCTION: This exam was performed according to the departmental dose-optimization program which includes automated exposure control, adjustment of the mA and/or kV according to patient size and/or use of iterative reconstruction technique. CONTRAST:  163m  OMNIPAQUE IOHEXOL 300 MG/ML  SOLN COMPARISON:  10/11/2014 FINDINGS: Lower chest:  Coronary atherosclerosis.  Mild dependent atelectasis. Hepatobiliary: No focal liver abnormality.No evidence of biliary obstruction or stone. Pancreas: Unremarkable. Spleen: Unremarkable. Adrenals/Urinary Tract: Negative adrenals. No hydronephrosis or stone. Unremarkable bladder. Stomach/Bowel: Distended loops of small bowel containing fluid, gas, and ultimately fecalization with twisted loops in the right lower quadrant including the terminal ileum. No bowel perforation or inflammation seen. Appendectomy. Vascular/Lymphatic: Atheromatous calcification of the aorta and its branches. No mass or adenopathy. Reproductive:Phleboliths. The left ovary with adjacent phleboliths appears rotated into the midline without ovarian swelling. Other: No ascites or pneumoperitoneum. Musculoskeletal: No acute abnormalities. IMPRESSION: 1. Partial or early small bowel obstruction with transition in the right lower quadrant where there is distortion of small bowel loops in the region of prior appendectomy, presumed adhesive disease. 2.  Aortic Atherosclerosis (ICD10-I70.0).  Coronary atherosclerosis. Electronically Signed   By: JJorje GuildM.D.   On: 09/12/2022 05:30     Labs:   Basic Metabolic Panel: Recent Labs  Lab 09/14/22 0429 09/15/22 0553 09/16/22 0539 09/17/22 0839 09/18/22 0519 09/19/22 0643  NA 134* 137 138 136 136 131*  K 3.8 4.0 3.4* 3.8 3.2* 3.7  CL 101 100 103 99 99 99  CO2 '22 27 25 26 25 22  '$ GLUCOSE 117* 120* 111* 81 84 118*  BUN 23  25* '23 13 16 13  '$ CREATININE 0.84 0.97 0.87 0.71 0.61 0.82  CALCIUM 9.2 8.9 8.5* 8.5* 8.7* 8.7*  MG 2.1 2.2 2.2 2.2 2.1 2.0  PHOS 3.5  --   --   --   --   --    GFR Estimated Creatinine Clearance: 66.3 mL/min (by C-G formula based on SCr of 0.82 mg/dL). Liver Function Tests: No results for input(s): "AST", "ALT", "ALKPHOS", "BILITOT", "PROT", "ALBUMIN" in the last 168 hours. No  results for input(s): "LIPASE", "AMYLASE" in the last 168 hours. No results for input(s): "AMMONIA" in the last 168 hours. Coagulation profile No results for input(s): "INR", "PROTIME" in the last 168 hours.  CBC: Recent Labs  Lab 09/14/22 0429 09/15/22 0553 09/16/22 0539 09/17/22 0839 09/18/22 0519 09/18/22 0802 09/19/22 0643  WBC 7.5   < > 7.2 6.9 5.3 5.2 4.7  NEUTROABS 5.1  --   --   --   --   --   --   HGB 14.4   < > 12.8 12.9 12.2 12.4 11.8*  HCT 44.0   < > 39.8 39.0 37.1 37.6 35.8*  MCV 92.1   < > 94.1 94.0 93.0 92.6 92.7  PLT 330   < > 264 262 263 242 258   < > = values in this interval not displayed.   Cardiac Enzymes: No results for input(s): "CKTOTAL", "CKMB", "CKMBINDEX", "TROPONINI" in the last 168 hours. BNP: Invalid input(s): "POCBNP" CBG: Recent Labs  Lab 09/19/22 1626 09/19/22 1958 09/20/22 0021 09/20/22 0409 09/20/22 0739  GLUCAP 191* 77 85 76 106*   D-Dimer No results for input(s): "DDIMER" in the last 72 hours. Hgb A1c No results for input(s): "HGBA1C" in the last 72 hours. Lipid Profile No results for input(s): "CHOL", "HDL", "LDLCALC", "TRIG", "CHOLHDL", "LDLDIRECT" in the last 72 hours. Thyroid function studies No results for input(s): "TSH", "T4TOTAL", "T3FREE", "THYROIDAB" in the last 72 hours.  Invalid input(s): "FREET3" Anemia work up No results for input(s): "VITAMINB12", "FOLATE", "FERRITIN", "TIBC", "IRON", "RETICCTPCT" in the last 72 hours. Microbiology Recent Results (from the past 240 hour(s))  Surgical PCR screen     Status: Abnormal   Collection Time: 09/14/22 10:06 AM   Specimen: Nasal Mucosa; Nasal Swab  Result Value Ref Range Status   MRSA, PCR POSITIVE (A) NEGATIVE Final   Staphylococcus aureus POSITIVE (A) NEGATIVE Final    Comment: (NOTE) The Xpert SA Assay (FDA approved for NASAL specimens in patients 97 years of age and older), is one component of a comprehensive surveillance program. It is not intended to diagnose  infection nor to guide or monitor treatment. Performed at [facility], [place] [address], [place], [place] [address]      Discharge Instructions:   Discharge Instructions     Call MD for:  persistant nausea and vomiting   Complete by: As directed    Call MD for:  severe uncontrolled pain   Complete by: As directed    Call MD for:  temperature >100.4   Complete by: As directed    Diet - low sodium heart healthy   Complete by: As directed    Discharge instructions   Complete by: As directed    Follow-up with your primary care provider in 1 week.  Check blood work at that time.  Follow-up with general surgery as scheduled by the clinic for post surgical follow-up on 10/12/2022.  Seek medical attention for worsening symptoms.   Increase activity slowly   Complete by:  As directed       Allergies as of 09/20/2022       Reactions   Adhesive [tape] Other (See Comments)   BandAids   Amoxil [amoxicillin] Other (See Comments)   Loopy feeling   Azor [amlodipine-olmesartan] Other (See Comments)   Pain   Bystolic [nebivolol Hcl] Itching   Crestor [rosuvastatin] Other (See Comments)   Pain   Diltiazem Nausea And Vomiting   Ezetimibe Other (See Comments)   Causes heart to race.   Lipitor [atorvastatin] Other (See Comments)   Pain   Lisinopril Cough   Milk-related Compounds Other (See Comments)   Lactose intolerance   Prednisone Other (See Comments)   Elevated BP   Sulfonamide Derivatives    REACTION: HIVES   Diovan [valsartan] Palpitations        Medication List     STOP taking these medications    amLODipine 5 MG tablet Commonly known as: NORVASC   azithromycin 250 MG tablet Commonly known as: Zithromax Z-Pak   cefdinir 300 MG capsule Commonly known as: OMNICEF   mupirocin ointment 2 % Commonly known as: BACTROBAN   predniSONE 20 MG tablet Commonly known as: DELTASONE   promethazine-dextromethorphan 6.25-15 MG/5ML syrup Commonly known as:  PROMETHAZINE-DM       TAKE these medications    BERBERINE COMPLEX PO Take by mouth.   Betimol 0.5 % ophthalmic solution Generic drug: timolol Place 1 drop into the right eye 2 (two) times daily.   CALCIUM/MAGNESIUM/ZINC FORMULA PO Take 3,000 mg by mouth daily.   CINNAMON PO Take 1 capsule by mouth daily.   CITRUS BERGAMOT PO Take 2 capsules by mouth 2 (two) times daily. With meals   CO Q 10 PO Take 1 capsule by mouth daily.   desonide 0.05 % cream Commonly known as: DESOWEN Apply 1 application  topically 2 (two) times daily as needed (itching).   Eliquis 5 MG Tabs tablet Generic drug: apixaban TAKE 1 TABLET TWICE A DAY   fluticasone 50 MCG/ACT nasal spray Commonly known as: FLONASE Place 2 sprays into both nostrils daily.   FreeStyle Freedom Lite w/Device Kit Use as directed once a day.  DX code: E11.9   glucose blood test strip Use as instructed--redi code plus ,advicate   FREESTYLE LITE test strip Generic drug: glucose blood USE AS INSTRUCTED   GRAPE SEED CR PO Take 1 tablet by mouth daily.   hydrochlorothiazide 25 MG tablet Commonly known as: HYDRODIURIL TAKE ONE-HALF (1/2) TABLET DAILY   loratadine 10 MG tablet Commonly known as: CLARITIN Take 10 mg by mouth daily as needed.   metoprolol succinate 25 MG 24 hr tablet Commonly known as: TOPROL-XL Take 0.5 tablets (12.5 mg total) by mouth daily.   MULTIPLE VITAMIN PO Take 1 tablet by mouth daily.   omeprazole 20 MG capsule Commonly known as: PRILOSEC TAKE 1 CAPSULE(20 MG) BY MOUTH DAILY What changed:  how much to take how to take this when to take this   ondansetron 4 MG tablet Commonly known as: ZOFRAN Take 1 tablet (4 mg total) by mouth every 6 (six) hours as needed for nausea.   OVER THE COUNTER MEDICATION Turmeric Root Extract-Take 1 capsule by mouth daily.   oxyCODONE 5 MG immediate release tablet Commonly known as: Oxy IR/ROXICODONE Take 1-2 tablets (5-10 mg total) by mouth  every 6 (six) hours as needed for moderate pain or severe pain.   VITAMIN B COMPLEX PO Take 1 capsule by mouth daily.   vitamin C 100  MG tablet Take 1,000 mg by mouth daily. Take '1000mg'$  daily, then increases to '3000mg'$  daily if feeling illness        Follow-up Information     Stark Klein, MD. Go on 10/12/2022.   Specialty: General Surgery Why: 415pm. Please arrive 30 minutes prior to your appointment for paperwork. Please bring a copy of your photo ID and insurance card. Contact information: [address] [place] [address] [phone]         [place], DO Follow up in 1 week(s).   Specialty: Family Medicine Contact information: [address] [place] [address] [phone]                  Time coordinating discharge: 39 minutes  Signed:  Munira Polson  [facility] 09/20/2022, 2:11 PM

## 2022-09-20 NOTE — Progress Notes (Signed)
6 Days Post-Op   Subjective/Chief Complaint: No complaints. Feels good. Tolerating diet and having bm's   Objective: Vital signs in last 24 hours: Temp:  [98.2 F (36.8 C)-98.5 F (36.9 C)] 98.4 F (36.9 C) (02/25 0411) Pulse Rate:  [55-57] 57 (02/25 0411) Resp:  [16-17] 17 (02/25 0411) BP: (99-108)/(67-74) 108/74 (02/25 0411) SpO2:  [97 %-100 %] 97 % (02/25 0411) Last BM Date : 09/18/22  Intake/Output from previous day: 02/24 0701 - 02/25 0700 In: 456 [P.O.:456] Out: -  Intake/Output this shift: No intake/output data recorded.  General appearance: alert and cooperative Resp: clear to auscultation bilaterally Cardio: regular rate and rhythm GI: soft, nontender. Incision looks good  Lab Results:  Recent Labs    09/18/22 0802 09/19/22 0643  WBC 5.2 4.7  HGB 12.4 11.8*  HCT 37.6 35.8*  PLT 242 258   BMET Recent Labs    09/18/22 0519 09/19/22 0643  NA 136 131*  K 3.2* 3.7  CL 99 99  CO2 25 22  GLUCOSE 84 118*  BUN 16 13  CREATININE 0.61 0.82  CALCIUM 8.7* 8.7*   PT/INR No results for input(s): "LABPROT", "INR" in the last 72 hours. ABG No results for input(s): "PHART", "HCO3" in the last 72 hours.  Invalid input(s): "PCO2", "PO2"  Studies/Results: No results found.  Anti-infectives: Anti-infectives (From admission, onward)    Start     Dose/Rate Route Frequency Ordered Stop   09/14/22 1200  cefoTEtan (CEFOTAN) 2 g in sodium chloride 0.9 % 100 mL IVPB        2 g 200 mL/hr over 30 Minutes Intravenous On call to O.R. 09/14/22 1109 09/14/22 1412       Assessment/Plan: s/p Procedure(s): EXPLORATORY LAPAROTOMY, LYSIS OF ADHESIONS (N/A) Advance diet Discharge POD 5 s/p Exploratory laparotomy, lysis of adhesions for SBO by Dr. Barry Dienes on 09/14/22 - Adv to regular.  - Maximize po pain control options.  - Mobilize - Pulm toilet   FEN - FLD. IVF per TRH.   VTE - SCDs, heparin gtt ID - Cefotetan peri-op. None currently.  Foley - None,  voiding. Plan - As above.    PAF on Eliquis (last dose 2/16). May restart at any time now DM2 HTN HLD  LOS: 7 days    Adriana Collins 09/20/2022

## 2022-09-21 ENCOUNTER — Telehealth: Payer: Self-pay | Admitting: *Deleted

## 2022-09-21 ENCOUNTER — Telehealth: Payer: Self-pay

## 2022-09-21 ENCOUNTER — Telehealth: Payer: Self-pay | Admitting: Family Medicine

## 2022-09-21 NOTE — Telephone Encounter (Signed)
Pt called to advise that she had bowel obstruction surgery and was discharged yesterday. The hospital advised her to inform Dr. Etter Sjogren in case she wants to have a follow up with her. Please advise pt

## 2022-09-21 NOTE — Telephone Encounter (Signed)
FYI

## 2022-09-21 NOTE — Transitions of Care (Post Inpatient/ED Visit) (Signed)
   09/21/2022  Name: Mesha Fusselman MRN: [id number] DOB: 1950-12-13  Today's TOC FU Call Status: Today's TOC FU Call Status:: Successful TOC FU Call Competed TOC FU Call Complete Date: 09/21/22  Transition Care Management Follow-up Telephone Call Date of Discharge: 09/20/22 Discharge Facility: [facility]) Type of Discharge: Inpatient Admission Primary Inpatient Discharge Diagnosis:: small bowel obstruction How have you been since you were released from the hospital?: Better (I am feeling much better.) Any questions or concerns?: No  Items Reviewed: Did you receive and understand the discharge instructions provided?: Yes Medications obtained and verified?: Yes (Medications Reviewed) Any new allergies since your discharge?: No Dietary orders reviewed?: Yes Type of Diet Ordered:: Regular diet small amount Do you have support at home?: Yes People in Home: spouse Name of Support/Comfort Primary Source: husband and other family members  Home Care and Equipment/Supplies: [place] Ordered?: NA Any new equipment or medical supplies ordered?: NA  Functional Questionnaire: Do you need assistance with bathing/showering or dressing?: No Do you need assistance with meal preparation?: No Do you need assistance with eating?: No Do you have difficulty maintaining continence: No Do you need assistance with getting out of bed/getting out of a chair/moving?: No Do you have difficulty managing or taking your medications?: No  Folllow up appointments reviewed: PCP Follow-up appointment confirmed?: Yes Date of PCP follow-up appointment?: 09/24/22 Follow-up Provider: Dr. Carollee Herter Specialist [facility] Follow-up appointment confirmed?: Yes Date of Specialist follow-up appointment?: 10/12/22 Follow-Up Specialty Provider:: Dr. Barry Dienes Do you need transportation to your follow-up appointment?: No Do you understand care options if your condition(s) worsen?: Yes-patient  verbalized understanding  SDOH Interventions Today    Flowsheet Row Most Recent Value  SDOH Interventions   Food Insecurity Interventions Intervention Not Indicated  Transportation Interventions Intervention Not Indicated      TOC Interventions Today    Flowsheet Row Most Recent Value  TOC Interventions   TOC Interventions Discussed/Reviewed TOC Interventions Discussed, Arranged PCP follow up within 7 days/Care Guide scheduled, Post discharge activity limitations per provider, Post op wound/incision care, S/S of infection       SIGNATURE Peter Garter RN, Jackquline Denmark, [place] Management Coordinator [facility] [phone]

## 2022-09-21 NOTE — Progress Notes (Signed)
  Care Coordination  Note  09/21/2022 Name: Adriana Collins MRN: [id number] DOB: 06/22/1951  [name] [name] is a 72 y.o. year old primary care patient of [name], DO.       Follow up plan: Hospital Follow Up appointment scheduled with (Dr Etter Sjogren) on (09/24/2022) at (140pm).  Julian Hy, [place] Direct Dial: [phone]

## 2022-09-24 ENCOUNTER — Ambulatory Visit (INDEPENDENT_AMBULATORY_CARE_PROVIDER_SITE_OTHER): Payer: Medicare Other | Admitting: Family Medicine

## 2022-09-24 ENCOUNTER — Encounter: Payer: Self-pay | Admitting: Family Medicine

## 2022-09-24 VITALS — BP 110/80 | HR 56 | Temp 98.6°F | Resp 18 | Ht 65.0 in | Wt 174.6 lb

## 2022-09-24 DIAGNOSIS — E785 Hyperlipidemia, unspecified: Secondary | ICD-10-CM

## 2022-09-24 DIAGNOSIS — I10 Essential (primary) hypertension: Secondary | ICD-10-CM

## 2022-09-24 DIAGNOSIS — R110 Nausea: Secondary | ICD-10-CM | POA: Diagnosis not present

## 2022-09-24 DIAGNOSIS — E119 Type 2 diabetes mellitus without complications: Secondary | ICD-10-CM | POA: Insufficient documentation

## 2022-09-24 DIAGNOSIS — I77810 Thoracic aortic ectasia: Secondary | ICD-10-CM | POA: Diagnosis not present

## 2022-09-24 DIAGNOSIS — Z8719 Personal history of other diseases of the digestive system: Secondary | ICD-10-CM | POA: Diagnosis not present

## 2022-09-24 MED ORDER — ONDANSETRON HCL 4 MG PO TABS: 4.0000 mg | ORAL_TABLET | Freq: Four times a day (QID) | ORAL | 0 refills | Status: DC | PRN

## 2022-09-24 NOTE — Assessment & Plan Note (Signed)
Well controlled, no changes to meds. Encouraged heart healthy diet such as the DASH diet and exercise as tolerated.  °

## 2022-09-24 NOTE — Assessment & Plan Note (Signed)
Tolerating statin, encouraged heart healthy diet, avoid trans fats, minimize simple carbs and saturated fats. Increase exercise as tolerated 
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
## 2022-09-24 NOTE — Assessment & Plan Note (Signed)
Check labs 

## 2022-09-24 NOTE — Assessment & Plan Note (Signed)
Stable  Wound healing well  Has surgher f/u in a few weeks

## 2022-09-25 LAB — COMPREHENSIVE METABOLIC PANEL
ALT: 20 U/L (ref 0–35)
AST: 22 U/L (ref 0–37)
Albumin: 3.9 g/dL (ref 3.5–5.2)
Alkaline Phosphatase: 59 U/L (ref 39–117)
BUN: 10 mg/dL (ref 6–23)
CO2: 27 mEq/L (ref 19–32)
Calcium: 9.4 mg/dL (ref 8.4–10.5)
Chloride: 96 mEq/L (ref 96–112)
Creatinine, Ser: 0.98 mg/dL (ref 0.40–1.20)
GFR: 58.59 mL/min — ABNORMAL LOW (ref >60.00)
Glucose, Bld: 77 mg/dL (ref 70–99)
Potassium: 4.8 mEq/L (ref 3.5–5.1)
Sodium: 132 mEq/L — ABNORMAL LOW (ref 135–145)
Total Bilirubin: 0.7 mg/dL (ref 0.2–1.2)
Total Protein: 6.7 g/dL (ref 6.0–8.3)

## 2022-09-25 LAB — CBC WITH DIFFERENTIAL/PLATELET
Basophils Absolute: 0.1 10*3/uL (ref 0.0–0.1)
Basophils Relative: 1.3 % (ref 0.0–3.0)
Eosinophils Absolute: 0.1 10*3/uL (ref 0.0–0.7)
Eosinophils Relative: 1.7 % (ref 0.0–5.0)
HCT: 37.7 % (ref 36.0–46.0)
Hemoglobin: 12.7 g/dL (ref 12.0–15.0)
Lymphocytes Relative: 34.1 % (ref 12.0–46.0)
Lymphs Abs: 1.9 10*3/uL (ref 0.7–4.0)
MCHC: 33.7 g/dL (ref 30.0–36.0)
MCV: 91.8 fl (ref 78.0–100.0)
Monocytes Absolute: 0.7 10*3/uL (ref 0.1–1.0)
Monocytes Relative: 13.3 % — ABNORMAL HIGH (ref 3.0–12.0)
Neutro Abs: 2.8 10*3/uL (ref 1.4–7.7)
Neutrophils Relative %: 49.6 % (ref 43.0–77.0)
Platelets: 389 10*3/uL (ref 150.0–400.0)
RBC: 4.11 Mil/uL (ref 3.87–5.11)
RDW: 14.1 % (ref 11.5–15.5)
WBC: 5.6 10*3/uL (ref 4.0–10.5)

## 2022-09-25 LAB — MAGNESIUM: Magnesium: 1.6 mg/dL (ref 1.5–2.5)

## 2022-11-03 DIAGNOSIS — I480 Paroxysmal atrial fibrillation: Secondary | ICD-10-CM | POA: Diagnosis not present

## 2022-11-04 ENCOUNTER — Other Ambulatory Visit: Payer: Self-pay | Admitting: Internal Medicine

## 2022-11-13 DIAGNOSIS — H40002 Preglaucoma, unspecified, left eye: Secondary | ICD-10-CM | POA: Diagnosis not present

## 2022-11-13 DIAGNOSIS — H401111 Primary open-angle glaucoma, right eye, mild stage: Secondary | ICD-10-CM | POA: Diagnosis not present

## 2022-12-02 ENCOUNTER — Telehealth: Payer: Self-pay

## 2022-12-02 NOTE — Telephone Encounter (Signed)
Attempted to call patient to see if Eliquis (blood thinner) is still being taken. VM left for patient to confirm if she is or is not taking. In the event the pt is still taking her eliquis she will need a OV scheduled with Dr. Russella Dar or APP prior for clearance.   If the pt returns call please get her scheduled for OV.

## 2022-12-02 NOTE — Telephone Encounter (Signed)
Patient is scheduled for the next available appointment

## 2022-12-14 ENCOUNTER — Telehealth: Payer: Self-pay | Admitting: Family Medicine

## 2022-12-14 NOTE — Telephone Encounter (Signed)
Patient called and would like a med refill on ELIQUIS 5MG  AND TOPROL 25MG .

## 2022-12-17 MED ORDER — METOPROLOL SUCCINATE ER 25 MG PO TB24: 12.5000 mg | ORAL_TABLET | Freq: Every day | ORAL | 3 refills | Status: AC

## 2022-12-17 NOTE — Telephone Encounter (Signed)
Metoprolol refilled- will forward to pharmD for eliquis refill.

## 2022-12-17 NOTE — Telephone Encounter (Signed)
Pt returned the call and stated she is being seen by Dr. Alvino Chapel a Cardiologist with [facility] since it is closer to her home. Also, she states that Dr. Alvino Chapel has already filled it and she received the text from [facility] regarding this. She is aware that I will not send this eliquis refill since she has a new Cardiologist and she verbalized understanding.

## 2022-12-17 NOTE — Telephone Encounter (Signed)
Prescription refill request for Eliquis received. Indication: afib  Last office visit: Carolan Clines 10/13/2021 Scr: 0.98, 09/24/2022 Age: 72 yo  Weight: 79.2 kg   Per chart pt sees Dr. Alvino Chapel from [facility].   Called pt and LMOM prescription should be sent to Dr. Alvino Chapel unless pt is going to continue to follow with Dr. Wyline Mood.

## 2022-12-22 ENCOUNTER — Encounter: Payer: Medicare Other | Admitting: Gastroenterology

## 2023-01-01 ENCOUNTER — Other Ambulatory Visit: Payer: Self-pay | Admitting: *Deleted

## 2023-01-01 DIAGNOSIS — J0140 Acute pansinusitis, unspecified: Secondary | ICD-10-CM

## 2023-01-01 MED ORDER — FLUTICASONE PROPIONATE 50 MCG/ACT NA SUSP: 2.0000 | Freq: Every day | NASAL | 1 refills | Status: DC

## 2023-02-08 ENCOUNTER — Encounter: Payer: Self-pay | Admitting: Nurse Practitioner

## 2023-02-08 ENCOUNTER — Ambulatory Visit (INDEPENDENT_AMBULATORY_CARE_PROVIDER_SITE_OTHER): Payer: Medicare Other | Admitting: Nurse Practitioner

## 2023-02-08 VITALS — BP 120/80 | HR 75 | Ht 65.0 in | Wt 185.0 lb

## 2023-02-08 DIAGNOSIS — I4891 Unspecified atrial fibrillation: Secondary | ICD-10-CM

## 2023-02-08 DIAGNOSIS — K219 Gastro-esophageal reflux disease without esophagitis: Secondary | ICD-10-CM

## 2023-02-08 DIAGNOSIS — Z86010 Personal history of colonic polyps: Secondary | ICD-10-CM | POA: Diagnosis not present

## 2023-02-08 MED ORDER — NA SULFATE-K SULFATE-MG SULF 17.5-3.13-1.6 GM/177ML PO SOLN: 1.0000 | Freq: Once | ORAL | 0 refills | Status: AC

## 2023-02-08 NOTE — Patient Instructions (Addendum)
You have been scheduled for an endoscopy and colonoscopy. Please follow the written instructions given to you at your visit today.  Please pick up your prep supplies at the pharmacy within the next 1-3 days.  If you use inhalers (even only as needed), please bring them with you on the day of your procedure.  DO NOT TAKE 7 DAYS PRIOR TO TEST- Trulicity (dulaglutide) Ozempic, Wegovy (semaglutide) Mounjaro (tirzepatide) Bydureon Bcise (exanatide extended release)  DO NOT TAKE 1 DAY PRIOR TO YOUR TEST Rybelsus (semaglutide) Adlyxin (lixisenatide) Victoza (liraglutide) Byetta (exanatide) ___________________________________________________________________________  Omeprazole 20 mg- 1 by mouth daily  Due to recent changes in healthcare laws, you may see the results of your imaging and laboratory studies on MyChart before your provider has had a chance to review them.  We understand that in some cases there may be results that are confusing or concerning to you. Not all laboratory results come back in the same time frame and the provider may be waiting for multiple results in order to interpret others.  Please give [place] 48 hours in order for your provider to thoroughly review all the results before contacting the office for clarification of your results.   Thank you for trusting me with your gastrointestinal care!   Alcide Evener, CRNP
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
## 2023-02-09 ENCOUNTER — Telehealth: Payer: Self-pay

## 2023-02-09 NOTE — Telephone Encounter (Signed)
Contacted pt& pt is aware to hold Eliquis 2 days prior to her procedure per Cardiology.

## 2023-03-11 ENCOUNTER — Encounter: Payer: Medicare Other | Admitting: Gastroenterology

## 2023-03-18 ENCOUNTER — Telehealth: Payer: Self-pay | Admitting: Family Medicine

## 2023-03-18 ENCOUNTER — Other Ambulatory Visit: Payer: Self-pay | Admitting: Family Medicine

## 2023-03-18 DIAGNOSIS — I10 Essential (primary) hypertension: Secondary | ICD-10-CM

## 2023-03-18 DIAGNOSIS — H40002 Preglaucoma, unspecified, left eye: Secondary | ICD-10-CM | POA: Diagnosis not present

## 2023-03-18 DIAGNOSIS — H35033 Hypertensive retinopathy, bilateral: Secondary | ICD-10-CM | POA: Diagnosis not present

## 2023-03-18 DIAGNOSIS — H524 Presbyopia: Secondary | ICD-10-CM | POA: Diagnosis not present

## 2023-03-18 DIAGNOSIS — E119 Type 2 diabetes mellitus without complications: Secondary | ICD-10-CM

## 2023-03-18 DIAGNOSIS — H401111 Primary open-angle glaucoma, right eye, mild stage: Secondary | ICD-10-CM | POA: Diagnosis not present

## 2023-03-18 DIAGNOSIS — H2513 Age-related nuclear cataract, bilateral: Secondary | ICD-10-CM | POA: Diagnosis not present

## 2023-03-18 DIAGNOSIS — H43393 Other vitreous opacities, bilateral: Secondary | ICD-10-CM | POA: Diagnosis not present

## 2023-03-18 DIAGNOSIS — H5203 Hypermetropia, bilateral: Secondary | ICD-10-CM | POA: Diagnosis not present

## 2023-03-18 DIAGNOSIS — E785 Hyperlipidemia, unspecified: Secondary | ICD-10-CM

## 2023-03-18 DIAGNOSIS — H25013 Cortical age-related cataract, bilateral: Secondary | ICD-10-CM | POA: Diagnosis not present

## 2023-03-18 LAB — HM DIABETES EYE EXAM

## 2023-03-18 NOTE — Telephone Encounter (Signed)
Pt called & requested that she would like to have her A1c done but isn't sure if she is able to have one at the moment. Please advise pt.

## 2023-03-19 NOTE — Telephone Encounter (Signed)
Please schedule patient for a lab appointment. 

## 2023-03-22 NOTE — Telephone Encounter (Signed)
 Scheduled pt for 03/30/23

## 2023-03-30 ENCOUNTER — Other Ambulatory Visit (INDEPENDENT_AMBULATORY_CARE_PROVIDER_SITE_OTHER): Payer: Medicare Other

## 2023-03-30 DIAGNOSIS — E119 Type 2 diabetes mellitus without complications: Secondary | ICD-10-CM | POA: Diagnosis not present

## 2023-03-30 DIAGNOSIS — I10 Essential (primary) hypertension: Secondary | ICD-10-CM

## 2023-03-30 DIAGNOSIS — E785 Hyperlipidemia, unspecified: Secondary | ICD-10-CM | POA: Diagnosis not present

## 2023-03-30 LAB — CBC WITH DIFFERENTIAL/PLATELET
Basophils Absolute: 0 10*3/uL (ref 0.0–0.1)
Basophils Relative: 0.7 % (ref 0.0–3.0)
Eosinophils Absolute: 0 10*3/uL (ref 0.0–0.7)
Eosinophils Relative: 1.5 % (ref 0.0–5.0)
HCT: 41.6 % (ref 36.0–46.0)
Hemoglobin: 14 g/dL (ref 12.0–15.0)
Lymphocytes Relative: 49.3 % — ABNORMAL HIGH (ref 12.0–46.0)
Lymphs Abs: 1.6 10*3/uL (ref 0.7–4.0)
MCHC: 33.5 g/dL (ref 30.0–36.0)
MCV: 91.5 fl (ref 78.0–100.0)
Monocytes Absolute: 0.4 10*3/uL (ref 0.1–1.0)
Monocytes Relative: 11.2 % (ref 3.0–12.0)
Neutro Abs: 1.2 10*3/uL — ABNORMAL LOW (ref 1.4–7.7)
Neutrophils Relative %: 37.3 % — ABNORMAL LOW (ref 43.0–77.0)
Platelets: 343 10*3/uL (ref 150.0–400.0)
RBC: 4.55 Mil/uL (ref 3.87–5.11)
RDW: 14 % (ref 11.5–15.5)
WBC: 3.2 10*3/uL — ABNORMAL LOW (ref 4.0–10.5)

## 2023-03-30 LAB — COMPREHENSIVE METABOLIC PANEL
ALT: 17 U/L (ref 0–35)
AST: 20 U/L (ref 0–37)
Albumin: 4.2 g/dL (ref 3.5–5.2)
Alkaline Phosphatase: 63 U/L (ref 39–117)
BUN: 11 mg/dL (ref 6–23)
CO2: 28 meq/L (ref 19–32)
Calcium: 9.4 mg/dL (ref 8.4–10.5)
Chloride: 96 meq/L (ref 96–112)
Creatinine, Ser: 0.88 mg/dL (ref 0.40–1.20)
GFR: 66.43 mL/min (ref >60.00)
Glucose, Bld: 115 mg/dL — ABNORMAL HIGH (ref 70–99)
Potassium: 4.1 meq/L (ref 3.5–5.1)
Sodium: 133 meq/L — ABNORMAL LOW (ref 135–145)
Total Bilirubin: 1.2 mg/dL (ref 0.2–1.2)
Total Protein: 7.2 g/dL (ref 6.0–8.3)

## 2023-03-30 LAB — LIPID PANEL
Cholesterol: 318 mg/dL — ABNORMAL HIGH (ref 0–200)
HDL: 81.9 mg/dL (ref >39.00)
LDL Cholesterol: 220 mg/dL — ABNORMAL HIGH (ref 0–99)
NonHDL: 236.27
Total CHOL/HDL Ratio: 4
Triglycerides: 81 mg/dL (ref 0.0–149.0)
VLDL: 16.2 mg/dL (ref 0.0–40.0)

## 2023-03-30 LAB — MICROALBUMIN / CREATININE URINE RATIO
Creatinine,U: 165.3 mg/dL
Microalb Creat Ratio: 0.7 mg/g (ref 0.0–30.0)
Microalb, Ur: 1.1 mg/dL (ref 0.0–1.9)

## 2023-03-30 LAB — HEMOGLOBIN A1C: Hgb A1c MFr Bld: 6.3 % (ref 4.6–6.5)

## 2023-04-02 ENCOUNTER — Other Ambulatory Visit: Payer: Self-pay

## 2023-04-02 DIAGNOSIS — E785 Hyperlipidemia, unspecified: Secondary | ICD-10-CM

## 2023-04-05 ENCOUNTER — Encounter: Payer: Self-pay | Admitting: Gastroenterology

## 2023-04-05 ENCOUNTER — Ambulatory Visit (AMBULATORY_SURGERY_CENTER): Payer: Medicare Other | Admitting: Gastroenterology

## 2023-04-05 VITALS — BP 106/77 | HR 93 | Temp 97.1°F | Resp 13 | Ht 65.0 in | Wt 185.0 lb

## 2023-04-05 DIAGNOSIS — D123 Benign neoplasm of transverse colon: Secondary | ICD-10-CM | POA: Diagnosis not present

## 2023-04-05 DIAGNOSIS — Z86010 Personal history of colonic polyps: Secondary | ICD-10-CM

## 2023-04-05 DIAGNOSIS — F419 Anxiety disorder, unspecified: Secondary | ICD-10-CM | POA: Diagnosis not present

## 2023-04-05 DIAGNOSIS — E785 Hyperlipidemia, unspecified: Secondary | ICD-10-CM | POA: Diagnosis not present

## 2023-04-05 DIAGNOSIS — I10 Essential (primary) hypertension: Secondary | ICD-10-CM | POA: Diagnosis not present

## 2023-04-05 DIAGNOSIS — Z09 Encounter for follow-up examination after completed treatment for conditions other than malignant neoplasm: Secondary | ICD-10-CM

## 2023-04-05 DIAGNOSIS — Z800 Family history of malignant neoplasm of digestive organs: Secondary | ICD-10-CM | POA: Diagnosis not present

## 2023-04-05 DIAGNOSIS — K317 Polyp of stomach and duodenum: Secondary | ICD-10-CM

## 2023-04-05 DIAGNOSIS — E119 Type 2 diabetes mellitus without complications: Secondary | ICD-10-CM | POA: Diagnosis not present

## 2023-04-05 DIAGNOSIS — K219 Gastro-esophageal reflux disease without esophagitis: Secondary | ICD-10-CM

## 2023-04-05 MED ORDER — SODIUM CHLORIDE 0.9 % IV SOLN: 500.0000 mL | Freq: Once | INTRAVENOUS | Status: AC

## 2023-04-05 NOTE — Op Note (Addendum)
[facility] Patient Name: Adriana Collins Procedure Date: 04/05/2023 2:52 PM MRN: [id number] Endoscopist: Meryl Dare , MD, [phone] Age: 72 Referring MD:  Date of Birth: 1950/09/22 Gender: Female Account #: [id number] Procedure:                Colonoscopy Indications:              Screening in patient at increased risk: Family                            history of 1st-degree relative with colorectal                            cancer, Remote personal history of adenomatous                            colon polyps Medicines:                Monitored Anesthesia Care Procedure:                Pre-Anesthesia Assessment:                           - Prior to the procedure, a History and Physical                            was performed, and patient medications and                            allergies were reviewed. The patient's tolerance of                            previous anesthesia was also reviewed. The risks                            and benefits of the procedure and the sedation                            options and risks were discussed with the patient.                            All questions were answered, and informed consent                            was obtained. Prior Anticoagulants: The patient has                            taken Eliquis (apixaban), last dose was 2 days                            prior to procedure. ASA Grade Assessment: II - A                            patient with mild systemic disease. After reviewing  the risks and benefits, the patient was deemed in                            satisfactory condition to undergo the procedure.                           After obtaining informed consent, the colonoscope                            was passed under direct vision. Throughout the                            procedure, the patient's blood pressure, pulse, and                            oxygen saturations were  monitored continuously. The                            Olympus Scope SN: [id number] was introduced through                            the anus and advanced to the the cecum, identified                            by appendiceal orifice and ileocecal valve. The                            ileocecal valve, appendiceal orifice, and rectum                            were photographed. The quality of the bowel                            preparation was good. The colonoscopy was performed                            without difficulty. The patient tolerated the                            procedure well. Scope In: 2:58:17 PM Scope Out: 3:09:06 PM Scope Withdrawal Time: 0 hours 8 minutes 39 seconds  Total Procedure Duration: 0 hours 10 minutes 49 seconds  Findings:                 The perianal and digital rectal examinations were                            normal.                           A 6 mm polyp was found in the transverse colon. The                            polyp was sessile. The polyp was removed with a  cold snare. Resection and retrieval were complete.                           Internal hemorrhoids were found during                            retroflexion. The hemorrhoids were small and Grade                            I (internal hemorrhoids that do not prolapse).                           The exam was otherwise without abnormality on                            direct and retroflexion views. Complications:            No immediate complications. Estimated blood loss:                            None. Estimated Blood Loss:     Estimated blood loss: none. Impression:               - One 6 mm polyp in the transverse colon, removed                            with a cold snare. Resected and retrieved.                           - Internal hemorrhoids.                           - The examination was otherwise normal on direct                            and retroflexion  views. Recommendation:           - Repeat colonoscopy, likely 5 years, date to be                            determined after pending pathology results are                            reviewed for surveillance based on pathology                            results.                           - Patient has a contact number available for                            emergencies. The signs and symptoms of potential                            delayed complications were discussed with the  patient. Return to normal activities tomorrow.                            Written discharge instructions were provided to the                            patient.                           - Resume previous diet.                           - Continue present medications.                           - Await pathology results.                           - Resume Eliquis (apixaban) at prior dose tomorrow.                            Refer to managing physician for further adjustment                            of therapy. Meryl Dare, MD 04/05/2023 3:12:19 PM This report has been signed electronically.
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
## 2023-04-05 NOTE — Patient Instructions (Addendum)
YOU HAD AN ENDOSCOPIC PROCEDURE TODAY AT THE [facility]:   Refer to the procedure report that was given to you for any specific questions about what was found during the examination.  If the procedure report does not answer your questions, please call your gastroenterologist to clarify.  If you requested that your care partner not be given the details of your procedure findings, then the procedure report has been included in a sealed envelope for you to review at your convenience later.  YOU SHOULD EXPECT: Some feelings of bloating in the abdomen. Passage of more gas than usual.  Walking can help get rid of the air that was put into your GI tract during the procedure and reduce the bloating. If you had a lower endoscopy (such as a colonoscopy or flexible sigmoidoscopy) you may notice spotting of blood in your stool or on the toilet paper. If you underwent a bowel prep for your procedure, you may not have a normal bowel movement for a few days.  Please Note:  You might notice some irritation and congestion in your nose or some drainage.  This is from the oxygen used during your procedure.  There is no need for concern and it should clear up in a day or so.  SYMPTOMS TO REPORT IMMEDIATELY:  Following lower endoscopy (colonoscopy or flexible sigmoidoscopy):  Excessive amounts of blood in the stool  Significant tenderness or worsening of abdominal pains  Swelling of the abdomen that is new, acute  Fever of 100F or higher  Following upper endoscopy (EGD)  Vomiting of blood or coffee ground material  New chest pain or pain under the shoulder blades  Painful or persistently difficult swallowing  New shortness of breath  Fever of 100F or higher  Black, tarry-looking stools  For urgent or emergent issues, a gastroenterologist can be reached at any hour by calling [phone]. Do not use MyChart messaging for urgent concerns.    DIET:  We do recommend a small meal at first, but  then you may proceed to your regular diet.  Drink plenty of fluids but you should avoid alcoholic beverages for 24 hours.  MEDICATIONS: Continue present medications. Resume Eliquis (apixaban) at prior tomorrow. Refer to managing physician for further adjustment of therapy.  FOLLOW UP: Await pathology results.  Thank you for allowing [place] to provide for your healthcare needs today.  ACTIVITY:  You should plan to take it easy for the rest of today and you should NOT DRIVE or use heavy machinery until tomorrow (because of the sedation medicines used during the test).    FOLLOW UP: Our staff will call the number listed on your records the next business day following your procedure.  We will call around 7:15- 8:00 am to check on you and address any questions or concerns that you may have regarding the information given to you following your procedure. If we do not reach you, we will leave a message.     If any biopsies were taken you will be contacted by phone or by letter within the next 1-3 weeks.  Please call us at [phone] if you have not heard about the biopsies in 3 weeks.    SIGNATURES/CONFIDENTIALITY: You and/or your care partner have signed paperwork which will be entered into your electronic medical record.  These signatures attest to the fact that that the information above on your After Visit Summary has been reviewed and is understood.  Full responsibility of the confidentiality of this  discharge information lies with you and/or your care-partner.

## 2023-04-05 NOTE — Op Note (Signed)
[facility] Patient Name: Adriana Collins Procedure Date: 04/05/2023 2:51 PM MRN: [id number] Endoscopist: Meryl Dare , MD, [phone] Age: 72 Referring MD:  Date of Birth: [birth year] Gender: Female Account #: [id number] Procedure:                Upper GI endoscopy Indications:              Gastroesophageal reflux disease Medicines:                Monitored Anesthesia Care Procedure:                Pre-Anesthesia Assessment:                           - Prior to the procedure, a History and Physical                            was performed, and patient medications and                            allergies were reviewed. The patient's tolerance of                            previous anesthesia was also reviewed. The risks                            and benefits of the procedure and the sedation                            options and risks were discussed with the patient.                            All questions were answered, and informed consent                            was obtained. Prior Anticoagulants: The patient has                            taken Eliquis (apixaban), last dose was 2 days                            prior to procedure. ASA Grade Assessment: II - A                            patient with mild systemic disease. After reviewing                            the risks and benefits, the patient was deemed in                            satisfactory condition to undergo the procedure.                           After obtaining informed consent, the endoscope was  passed under direct vision. Throughout the                            procedure, the patient's blood pressure, pulse, and                            oxygen saturations were monitored continuously. The                            Olympus Scope [id number] was introduced through the                            mouth, and advanced to the second part of duodenum.                             The upper GI endoscopy was accomplished without                            difficulty. The patient tolerated the procedure                            well. Scope In: Scope Out: Findings:                 A single area of ectopic gastric mucosa was found                            in the upper third of the esophagus.                           The exam of the esophagus was otherwise normal.                           Multiple 2 to 3 mm sessile polyps with no bleeding                            and no stigmata of recent bleeding were found in                            the gastric fundus and in the gastric body.                            Biopsies were taken with a cold forceps for                            histology.                           The exam of the stomach was otherwise normal.                           The duodenal bulb and second portion of the  duodenum were normal. Complications:            No immediate complications. Estimated Blood Loss:     Estimated blood loss was minimal. Impression:               - Ectopic gastric mucosa in the upper third of the                            esophagus.                           - Multiple gastric polyps. Biopsied.                           - Normal duodenal bulb and second portion of the                            duodenum. Recommendation:           - Patient has a contact number available for                            emergencies. The signs and symptoms of potential                            delayed complications were discussed with the                            patient. Return to normal activities tomorrow.                            Written discharge instructions were provided to the                            patient.                           - Resume previous diet.                           - Follow antireflux measures.                           - Continue present medications.                            - Resume Eliquis (apixaban) at prior dose tomorrow.                            Refer to managing physician for further adjustment                            of therapy.                           - Await pathology results. Meryl Dare, MD 04/05/2023 3:25:39 PM This report has been signed electronically.

## 2023-04-05 NOTE — Progress Notes (Signed)
Pt's states no medical or surgical changes since previsit or office visit. 

## 2023-04-05 NOTE — Progress Notes (Signed)
Called to room to assist during endoscopic procedure.  Patient ID and intended procedure confirmed with present staff. Received instructions for my participation in the procedure from the performing physician.  

## 2023-04-05 NOTE — Progress Notes (Signed)
Uneventful anesthetic. Report to pacu rn. Vss. Care resumed by rn. 

## 2023-04-06 ENCOUNTER — Telehealth: Payer: Self-pay

## 2023-04-06 NOTE — Telephone Encounter (Signed)
Follow up call placed, VM obtained and message left. 

## 2023-04-08 LAB — SURGICAL PATHOLOGY

## 2023-04-18 ENCOUNTER — Encounter: Payer: Self-pay | Admitting: Family Medicine

## 2023-04-19 ENCOUNTER — Other Ambulatory Visit: Payer: Self-pay | Admitting: Family

## 2023-04-19 MED ORDER — ROSUVASTATIN CALCIUM 5 MG PO TABS: 5.0000 mg | ORAL_TABLET | Freq: Every day | ORAL | 3 refills | Status: AC

## 2023-04-22 ENCOUNTER — Encounter: Payer: Self-pay | Admitting: Gastroenterology

## 2023-06-03 DIAGNOSIS — I480 Paroxysmal atrial fibrillation: Secondary | ICD-10-CM | POA: Diagnosis not present

## 2023-06-18 ENCOUNTER — Telehealth: Payer: Self-pay | Admitting: Family Medicine

## 2023-06-18 NOTE — Telephone Encounter (Signed)
Copied from CRM [id number]. Topic: Medicare AWV >> Jun 18, 2023  9:32 AM Payton Doughty wrote: Reason for CRM: Called LVM 06/18/2023 to schedule Annual Wellness Visit  Verlee Rossetti; Care Guide Ambulatory Clinical Support [facility] Direct Dial: [phone]

## 2023-07-05 ENCOUNTER — Encounter: Payer: Self-pay | Admitting: Family Medicine

## 2023-07-05 NOTE — Telephone Encounter (Signed)
 Care team updated and letter sent for eye exam notes.

## 2023-07-06 ENCOUNTER — Other Ambulatory Visit: Payer: Medicare Other

## 2023-08-03 DIAGNOSIS — H40002 Preglaucoma, unspecified, left eye: Secondary | ICD-10-CM | POA: Diagnosis not present

## 2023-08-03 DIAGNOSIS — H401111 Primary open-angle glaucoma, right eye, mild stage: Secondary | ICD-10-CM | POA: Diagnosis not present

## 2023-08-22 ENCOUNTER — Other Ambulatory Visit: Payer: Self-pay | Admitting: Family Medicine

## 2023-08-22 DIAGNOSIS — I10 Essential (primary) hypertension: Secondary | ICD-10-CM

## 2023-08-26 ENCOUNTER — Encounter: Payer: Self-pay | Admitting: Family Medicine

## 2023-08-26 ENCOUNTER — Ambulatory Visit: Payer: Medicare Other | Admitting: Family Medicine

## 2023-08-26 VITALS — BP 112/90 | HR 122 | Temp 100.2°F | Resp 18 | Ht 65.0 in | Wt 180.6 lb

## 2023-08-26 DIAGNOSIS — R6889 Other general symptoms and signs: Secondary | ICD-10-CM

## 2023-08-26 DIAGNOSIS — J0140 Acute pansinusitis, unspecified: Secondary | ICD-10-CM | POA: Diagnosis not present

## 2023-08-26 DIAGNOSIS — R051 Acute cough: Secondary | ICD-10-CM

## 2023-08-26 LAB — POC INFLUENZA A&B (BINAX/QUICKVUE)
Influenza A, POC: NEGATIVE
Influenza B, POC: NEGATIVE

## 2023-08-26 LAB — POC COVID19 BINAXNOW: SARS Coronavirus 2 Ag: NEGATIVE

## 2023-08-26 MED ORDER — CEFDINIR 300 MG PO CAPS: 300.0000 mg | ORAL_CAPSULE | Freq: Two times a day (BID) | ORAL | 0 refills | Status: AC

## 2023-08-26 MED ORDER — PROMETHAZINE-DM 6.25-15 MG/5ML PO SYRP: 5.0000 mL | ORAL_SOLUTION | Freq: Four times a day (QID) | ORAL | 0 refills | Status: AC | PRN
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
## 2023-08-26 NOTE — Patient Instructions (Signed)

## 2023-08-26 NOTE — Assessment & Plan Note (Signed)
Flu and covid test neg

## 2023-10-19 ENCOUNTER — Other Ambulatory Visit: Payer: Self-pay | Admitting: Family Medicine

## 2023-10-19 DIAGNOSIS — J0140 Acute pansinusitis, unspecified: Secondary | ICD-10-CM

## 2023-11-05 ENCOUNTER — Other Ambulatory Visit: Payer: Self-pay | Admitting: Family Medicine

## 2023-11-05 DIAGNOSIS — I10 Essential (primary) hypertension: Secondary | ICD-10-CM

## 2023-11-26 ENCOUNTER — Other Ambulatory Visit: Payer: Self-pay | Admitting: Family Medicine

## 2023-11-26 ENCOUNTER — Other Ambulatory Visit: Payer: Self-pay

## 2023-11-26 ENCOUNTER — Ambulatory Visit
Admission: EM | Admit: 2023-11-26 | Discharge: 2023-11-26 | Disposition: A | Discharge status: Home / Self Care | Attending: Family Medicine | Admitting: Family Medicine

## 2023-11-26 ENCOUNTER — Ambulatory Visit: Payer: Self-pay

## 2023-11-26 DIAGNOSIS — R42 Dizziness and giddiness: Secondary | ICD-10-CM

## 2023-11-26 DIAGNOSIS — R1013 Epigastric pain: Secondary | ICD-10-CM

## 2023-11-26 LAB — POCT FASTING CBG KUC MANUAL ENTRY: POCT Glucose (KUC): 102 mg/dL — AB (ref 70–99)

## 2023-11-26 NOTE — Telephone Encounter (Unsigned)
 Copied from CRM [id number]. Topic: Clinical - Medication Refill >> Nov 26, 2023  1:44 PM [place] E wrote: Most Recent Primary Care Visit:  Provider: Roel Clarity R  Department: [facility]  Visit Type: OFFICE VISIT  Date: 08/26/2023  Medication: omeprazole  (PRILOSEC) 20 MG capsule  Has the patient contacted their pharmacy? Yes (Agent: If no, request that the patient contact the pharmacy for the refill. If patient does not wish to contact the pharmacy document the reason why and proceed with request.) (Agent: If yes, when and what did the pharmacy advise?)  Is this the correct pharmacy for this prescription? Yes If no, delete pharmacy and type the correct one.  This is the patient's preferred pharmacy:   [facility] - [place], [place] - [address] [place] [address] Phone: [phone] Fax: [phone]   Has the prescription been filled recently? No  Is the patient out of the medication? Yes  Has the patient been seen for an appointment in the last year OR does the patient have an upcoming appointment? Yes  Can we respond through MyChart? Yes  Agent: Please be advised that Rx refills may take up to 3 business days. We ask that you follow-up with your pharmacy.

## 2023-11-26 NOTE — Telephone Encounter (Signed)
 Chief Complaint: dizziness Symptoms: see above Frequency: episode occurred yesterday Pertinent Negatives: Patient denies CP, SOB, dizziness today, palpitations today, one-sided weakness, blurry vision, H/A, N/V, diarrhea, bleeding Disposition: [] ED /[x] Urgent Care (no appt availability in office) / [] Appointment(In office/virtual)/ []  [facility]/ [] Home Care/ [] Refused Recommended Disposition /[] [place] [facility]/ []  Follow-up with PCP Additional Notes: Pt reports episode yesterday where she became dizzy, lightheaded, hot, and sweaty. Pt denies having an episode similar to this before. Pt states this happened while cooking. Episode occurred at 5pm and pt had not eaten all day. Hx of type 2 DM. Pt does not take insulin  or oral meds. Pt did not check her sugar. Pt states she felt her HR at  that time and it felt "low and slow." Pt states she sat down, ate something, and felt better. Pt feels normal today. Pt denies CP, SOB, palpitations, N/V, bleeding, diarrhea, H/A, blurry vision, weakness. Hx of Afib on eliquis . RN advised pt she should see a HCP within 24 hours to ensure her safety. Pt agreeable to go to UC today. RN provided pt with the address of UC and wait time. RN advised pt if she develops symptoms similar to yesterday, CP, or SOB she should call 911. Pt verbalized understanding.    Copied from CRM [id number]. Topic: Clinical - Red Word Triage >> Nov 26, 2023  1:46 PM [place] E wrote: [facility] that prompted transfer to Nurse Triage: Dizziness. Patient was light-headed and dizzy yesterday along with being hot and sweaty. Patient stated that eating helped, but just wanting to make sure she is okay. Reason for Disposition  [1] MODERATE dizziness (e.g., interferes with normal activities) AND [2] has NOT been evaluated by doctor (or NP/PA) for this  (Exception: Dizziness caused by heat exposure, sudden standing, or poor fluid intake.)  Answer Assessment - Initial Assessment  Questions 1. DESCRIPTION: "Describe your dizziness."     Lightheaded and dizzy yesterday, hot and sweaty. Happened when she was cooking, hadn't eaten all day and got dizzy at 5pm 2. LIGHTHEADED: "Do you feel lightheaded?" (e.g., somewhat faint, woozy, weak upon standing)     Happened yesterday. Pt sat down and ate something and felt better. 3. VERTIGO: "Do you feel like either you or the room is spinning or tilting?" (i.e. vertigo)     No 4. SEVERITY: "How bad is it?"  "Do you feel like you are going to faint?" "Can you stand and walk?"   - MILD: Feels slightly dizzy, but walking normally.   - MODERATE: Feels unsteady when walking, but not falling; interferes with normal activities (e.g., school, work).   - SEVERE: Unable to walk without falling, or requires assistance to walk without falling; feels like passing out now.      Had to sit down 5. ONSET:  "When did the dizziness begin?"     Yesterday 6. AGGRAVATING FACTORS: "Does anything make it worse?" (e.g., standing, change in head position)     Pt states she was cooking when it happened 7. HEART RATE: "Can you tell me your heart rate?" "How many beats in 15 seconds?"  (Note: not all patients can do this)       Low, slow HR when she was dizzy 8. CAUSE: "What do you think is causing the dizziness?"     Not sure  9. RECURRENT SYMPTOM: "Have you had dizziness before?" If Yes, ask: "When was last time?" "What happened that time?"     "Not to this extreme" 10.  OTHER SYMPTOMS: "Do you have any other symptoms?" (e.g., fever, chest pain, vomiting, diarrhea, bleeding)       Hx of Afib on Eliquis . Pt states she checked her pulse herself when she was dizzy and states her pulse was "slow and low." Hx of type 2 DM, pt states she did not check her sugar when she was dizzy but felt better after eating. Does not take insulin  or oral meds. Has not checked BG recently   No CP at that time. Denies SOB. Denies vomiting, diarrhea, bleeding  Protocols used:  Dizziness - Lightheadedness-A-AH

## 2023-11-26 NOTE — ED Triage Notes (Signed)
 Pt c/o lightheadedness and profuse sweating. Pt states she had to sit down, because she felt like she was going to pass out yesterday. Pt states she didn't eat or drink all day, but then after she ate and drink her lightheadness went away after awhile. Pt denies lightheadness at present

## 2023-11-26 NOTE — Discharge Instructions (Addendum)
 Please follow-up with your PCP and/or cardiologist.  If your symptoms recur please go to the emergency room/call 911 for further evaluation.
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
## 2023-11-29 ENCOUNTER — Other Ambulatory Visit: Payer: Self-pay | Admitting: Family Medicine

## 2023-11-29 DIAGNOSIS — E119 Type 2 diabetes mellitus without complications: Secondary | ICD-10-CM

## 2023-11-29 NOTE — Telephone Encounter (Signed)
 Last Fill: 10/29/21  Last OV: 08/26/23 ACUTE Next OV: None Scheduled  Routing to provider for review/authorization.

## 2023-11-29 NOTE — Telephone Encounter (Unsigned)
 Copied from CRM [id number]. Topic: Clinical - Medication Refill >> Nov 29, 2023 12:05 PM Alyse July wrote: Most Recent Primary Care Visit:  Provider: Roel Clarity R  Department: [facility]  Visit Type: OFFICE VISIT  Date: 08/26/2023  Medication: glucose blood (FREESTYLE LITE) test strip  Has the patient contacted their pharmacy? Yes (Agent: If no, request that the patient contact the pharmacy for the refill. If patient does not wish to contact the pharmacy document the reason why and proceed with request.) (Agent: If yes, when and what did the pharmacy advise?)  Is this the correct pharmacy for this prescription? Yes If no, delete pharmacy and type the correct one.  This is the patient's preferred pharmacy:   [facility] - [place], [place] - [address] [place] [address] Phone: [phone] Fax: [phone]   Has the prescription been filled recently? No  Is the patient out of the medication? No (the one patient has is expired)  Has the patient been seen for an appointment in the last year OR does the patient have an upcoming appointment? Yes  Can we respond through MyChart? No  Agent: Please be advised that Rx refills may take up to 3 business days. We ask that you follow-up with your pharmacy.

## 2023-11-29 NOTE — Telephone Encounter (Signed)
 Last Fill: 01/31/22  Last OV: 08/26/23 Next OV: None Scheduled  Routing to provider for review/authorization.

## 2023-11-30 MED ORDER — GLUCOSE BLOOD VI STRP: ORAL_STRIP | 12 refills | Status: AC

## 2023-11-30 MED ORDER — OMEPRAZOLE 20 MG PO CPDR: DELAYED_RELEASE_CAPSULE | ORAL | 1 refills | Status: DC

## 2023-12-10 DIAGNOSIS — H401111 Primary open-angle glaucoma, right eye, mild stage: Secondary | ICD-10-CM | POA: Diagnosis not present

## 2023-12-10 DIAGNOSIS — H40002 Preglaucoma, unspecified, left eye: Secondary | ICD-10-CM | POA: Diagnosis not present

## 2024-02-11 DIAGNOSIS — H401111 Primary open-angle glaucoma, right eye, mild stage: Secondary | ICD-10-CM | POA: Diagnosis not present

## 2024-02-11 DIAGNOSIS — H40002 Preglaucoma, unspecified, left eye: Secondary | ICD-10-CM | POA: Diagnosis not present

## 2024-02-21 DIAGNOSIS — L249 Irritant contact dermatitis, unspecified cause: Secondary | ICD-10-CM | POA: Diagnosis not present

## 2024-04-23 ENCOUNTER — Other Ambulatory Visit: Payer: Self-pay | Admitting: Family Medicine

## 2024-04-23 DIAGNOSIS — R1013 Epigastric pain: Secondary | ICD-10-CM

## 2024-06-01 DIAGNOSIS — I480 Paroxysmal atrial fibrillation: Secondary | ICD-10-CM | POA: Diagnosis not present

## 2024-06-13 DIAGNOSIS — H5203 Hypermetropia, bilateral: Secondary | ICD-10-CM | POA: Diagnosis not present

## 2024-06-13 DIAGNOSIS — E119 Type 2 diabetes mellitus without complications: Secondary | ICD-10-CM | POA: Diagnosis not present

## 2024-06-13 DIAGNOSIS — H35033 Hypertensive retinopathy, bilateral: Secondary | ICD-10-CM | POA: Diagnosis not present

## 2024-06-13 DIAGNOSIS — H40002 Preglaucoma, unspecified, left eye: Secondary | ICD-10-CM | POA: Diagnosis not present

## 2024-06-13 DIAGNOSIS — H2513 Age-related nuclear cataract, bilateral: Secondary | ICD-10-CM | POA: Diagnosis not present

## 2024-06-13 DIAGNOSIS — H524 Presbyopia: Secondary | ICD-10-CM | POA: Diagnosis not present

## 2024-06-13 DIAGNOSIS — H25013 Cortical age-related cataract, bilateral: Secondary | ICD-10-CM | POA: Diagnosis not present

## 2024-06-13 DIAGNOSIS — H401112 Primary open-angle glaucoma, right eye, moderate stage: Secondary | ICD-10-CM | POA: Diagnosis not present

## 2024-06-13 DIAGNOSIS — H43393 Other vitreous opacities, bilateral: Secondary | ICD-10-CM | POA: Diagnosis not present

## 2024-08-04 ENCOUNTER — Ambulatory Visit: Admitting: Family Medicine

## 2024-08-04 ENCOUNTER — Encounter: Payer: Self-pay | Admitting: Family Medicine

## 2024-08-04 VITALS — BP 112/76 | HR 94 | Temp 99.1°F | Ht 65.0 in | Wt 179.2 lb

## 2024-08-04 DIAGNOSIS — E119 Type 2 diabetes mellitus without complications: Secondary | ICD-10-CM

## 2024-08-04 DIAGNOSIS — I10 Essential (primary) hypertension: Secondary | ICD-10-CM | POA: Diagnosis not present

## 2024-08-04 DIAGNOSIS — I480 Paroxysmal atrial fibrillation: Secondary | ICD-10-CM | POA: Diagnosis not present

## 2024-08-04 DIAGNOSIS — E785 Hyperlipidemia, unspecified: Secondary | ICD-10-CM | POA: Diagnosis not present

## 2024-08-04 DIAGNOSIS — I200 Unstable angina: Secondary | ICD-10-CM

## 2024-08-04 DIAGNOSIS — E1165 Type 2 diabetes mellitus with hyperglycemia: Secondary | ICD-10-CM | POA: Diagnosis not present

## 2024-08-04 LAB — MICROALBUMIN / CREATININE URINE RATIO
Creatinine,U: 119.7 mg/dL
Microalb Creat Ratio: UNDETERMINED mg/g (ref 0.0–30.0)
Microalb, Ur: <0.7 mg/dL

## 2024-08-04 LAB — COMPREHENSIVE METABOLIC PANEL WITH GFR
ALT: 19 U/L (ref 3–35)
AST: 24 U/L (ref 5–37)
Albumin: 4.1 g/dL (ref 3.5–5.2)
Alkaline Phosphatase: 56 U/L (ref 39–117)
BUN: 9 mg/dL (ref 6–23)
CO2: 32 meq/L (ref 19–32)
Calcium: 9.5 mg/dL (ref 8.4–10.5)
Chloride: 99 meq/L (ref 96–112)
Creatinine, Ser: 0.76 mg/dL (ref 0.40–1.20)
GFR: 78.46 mL/min
Glucose, Bld: 90 mg/dL (ref 70–99)
Potassium: 4.6 meq/L (ref 3.5–5.1)
Sodium: 137 meq/L (ref 135–145)
Total Bilirubin: 1.2 mg/dL (ref 0.2–1.2)
Total Protein: 6.9 g/dL (ref 6.0–8.3)

## 2024-08-04 LAB — CBC WITH DIFFERENTIAL/PLATELET
Basophils Absolute: 0 K/uL (ref 0.0–0.1)
Basophils Relative: 1.2 % (ref 0.0–3.0)
Eosinophils Absolute: 0 K/uL (ref 0.0–0.7)
Eosinophils Relative: 1.5 % (ref 0.0–5.0)
HCT: 38.9 % (ref 36.0–46.0)
Hemoglobin: 13.2 g/dL (ref 12.0–15.0)
Lymphocytes Relative: 42.8 % (ref 12.0–46.0)
Lymphs Abs: 1.3 K/uL (ref 0.7–4.0)
MCHC: 33.9 g/dL (ref 30.0–36.0)
MCV: 91.9 fl (ref 78.0–100.0)
Monocytes Absolute: 0.4 K/uL (ref 0.1–1.0)
Monocytes Relative: 14.2 % — ABNORMAL HIGH (ref 3.0–12.0)
Neutro Abs: 1.2 K/uL — ABNORMAL LOW (ref 1.4–7.7)
Neutrophils Relative %: 40.3 % — ABNORMAL LOW (ref 43.0–77.0)
Platelets: 400 K/uL (ref 150.0–400.0)
RBC: 4.24 Mil/uL (ref 3.87–5.11)
RDW: 13.9 % (ref 11.5–15.5)
WBC: 3.1 K/uL — ABNORMAL LOW (ref 4.0–10.5)

## 2024-08-04 LAB — LIPID PANEL
Cholesterol: 276 mg/dL — ABNORMAL HIGH (ref 28–200)
HDL: 82.8 mg/dL
LDL Cholesterol: 180 mg/dL — ABNORMAL HIGH (ref 10–99)
NonHDL: 193.36
Total CHOL/HDL Ratio: 3
Triglycerides: 65 mg/dL (ref 10.0–149.0)
VLDL: 13 mg/dL (ref 0.0–40.0)

## 2024-08-04 LAB — HEMOGLOBIN A1C: Hgb A1c MFr Bld: 6.3 % (ref 4.6–6.5)

## 2024-08-04 MED ORDER — AMLODIPINE BESYLATE 5 MG PO TABS: 5.0000 mg | ORAL_TABLET | Freq: Every day | ORAL | 3 refills | Status: AC

## 2024-08-04 NOTE — Assessment & Plan Note (Signed)
 [id number] to be checked, minimize simple carbs. Increase exercise as tolerated. Continue current meds

## 2024-08-04 NOTE — Assessment & Plan Note (Signed)
 Encourage heart healthy diet such as MIND or DASH diet, increase exercise, avoid trans fats, simple carbohydrates and processed foods, consider a krill or fish or flaxseed oil cap daily.

## 2024-08-04 NOTE — Assessment & Plan Note (Signed)
 Per cardiology
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
## 2024-08-04 NOTE — Patient Instructions (Signed)

## 2024-08-04 NOTE — Assessment & Plan Note (Signed)
 Well controlled, no changes to meds. Encouraged heart healthy diet such as the DASH diet and exercise as tolerated.

## 2024-08-05 ENCOUNTER — Ambulatory Visit: Payer: Self-pay | Admitting: Family Medicine

## 2024-08-05 DIAGNOSIS — E785 Hyperlipidemia, unspecified: Secondary | ICD-10-CM

## 2024-08-05 DIAGNOSIS — G720 Drug-induced myopathy: Secondary | ICD-10-CM
